# Patient Record
Sex: Female | Born: 1935 | Race: White | Hispanic: No | Marital: Married | Smoking: Never smoker
Health system: Southern US, Community
[De-identification: ages and names within clinical notes are randomized; demographics above are authoritative.]

## PROBLEM LIST (undated history)

## (undated) DIAGNOSIS — E785 Hyperlipidemia, unspecified: Secondary | ICD-10-CM

## (undated) DIAGNOSIS — F329 Major depressive disorder, single episode, unspecified: Secondary | ICD-10-CM

## (undated) DIAGNOSIS — C801 Malignant (primary) neoplasm, unspecified: Secondary | ICD-10-CM

## (undated) DIAGNOSIS — F32A Depression, unspecified: Secondary | ICD-10-CM

## (undated) DIAGNOSIS — M1990 Unspecified osteoarthritis, unspecified site: Secondary | ICD-10-CM

## (undated) DIAGNOSIS — M810 Age-related osteoporosis without current pathological fracture: Secondary | ICD-10-CM

## (undated) DIAGNOSIS — R011 Cardiac murmur, unspecified: Secondary | ICD-10-CM

## (undated) DIAGNOSIS — C189 Malignant neoplasm of colon, unspecified: Secondary | ICD-10-CM

## (undated) DIAGNOSIS — G629 Polyneuropathy, unspecified: Secondary | ICD-10-CM

## (undated) DIAGNOSIS — G259 Extrapyramidal and movement disorder, unspecified: Secondary | ICD-10-CM

## (undated) DIAGNOSIS — H539 Unspecified visual disturbance: Secondary | ICD-10-CM

## (undated) HISTORY — DX: Unspecified osteoarthritis, unspecified site: M19.90

## (undated) HISTORY — DX: Malignant (primary) neoplasm, unspecified: C80.1

## (undated) HISTORY — DX: Depression, unspecified: F32.A

## (undated) HISTORY — PX: COLON RESECTION: SHX5231

## (undated) HISTORY — DX: Extrapyramidal and movement disorder, unspecified: G25.9

## (undated) HISTORY — PX: CHOLECYSTECTOMY: SHX55

## (undated) HISTORY — DX: Cardiac murmur, unspecified: R01.1

## (undated) HISTORY — DX: Malignant neoplasm of colon, unspecified: C18.9

## (undated) HISTORY — DX: Age-related osteoporosis without current pathological fracture: M81.0

## (undated) HISTORY — DX: Unspecified visual disturbance: H53.9

## (undated) HISTORY — DX: Major depressive disorder, single episode, unspecified: F32.9

## (undated) HISTORY — DX: Hyperlipidemia, unspecified: E78.5

## (undated) HISTORY — DX: Polyneuropathy, unspecified: G62.9

---

## 2013-11-07 HISTORY — PX: CATARACT EXTRACTION, BILATERAL: SHX1313

## 2013-12-25 DIAGNOSIS — H35379 Puckering of macula, unspecified eye: Secondary | ICD-10-CM | POA: Diagnosis not present

## 2014-01-03 DIAGNOSIS — B353 Tinea pedis: Secondary | ICD-10-CM | POA: Diagnosis not present

## 2014-01-03 DIAGNOSIS — M2040 Other hammer toe(s) (acquired), unspecified foot: Secondary | ICD-10-CM | POA: Diagnosis not present

## 2014-01-03 DIAGNOSIS — L600 Ingrowing nail: Secondary | ICD-10-CM | POA: Diagnosis not present

## 2014-01-22 DIAGNOSIS — G609 Hereditary and idiopathic neuropathy, unspecified: Secondary | ICD-10-CM | POA: Diagnosis not present

## 2014-01-22 DIAGNOSIS — G20 Parkinson's disease: Secondary | ICD-10-CM | POA: Diagnosis not present

## 2014-02-17 DIAGNOSIS — H35379 Puckering of macula, unspecified eye: Secondary | ICD-10-CM | POA: Diagnosis not present

## 2014-02-24 DIAGNOSIS — R5383 Other fatigue: Secondary | ICD-10-CM | POA: Diagnosis not present

## 2014-02-24 DIAGNOSIS — R030 Elevated blood-pressure reading, without diagnosis of hypertension: Secondary | ICD-10-CM | POA: Diagnosis not present

## 2014-02-24 DIAGNOSIS — M899 Disorder of bone, unspecified: Secondary | ICD-10-CM | POA: Diagnosis not present

## 2014-02-24 DIAGNOSIS — M949 Disorder of cartilage, unspecified: Secondary | ICD-10-CM | POA: Diagnosis not present

## 2014-02-24 DIAGNOSIS — E785 Hyperlipidemia, unspecified: Secondary | ICD-10-CM | POA: Diagnosis not present

## 2014-02-24 DIAGNOSIS — R5381 Other malaise: Secondary | ICD-10-CM | POA: Diagnosis not present

## 2014-03-03 DIAGNOSIS — Z0000 Encounter for general adult medical examination without abnormal findings: Secondary | ICD-10-CM | POA: Diagnosis not present

## 2014-03-03 DIAGNOSIS — H3553 Other dystrophies primarily involving the sensory retina: Secondary | ICD-10-CM | POA: Diagnosis not present

## 2014-03-03 DIAGNOSIS — R269 Unspecified abnormalities of gait and mobility: Secondary | ICD-10-CM | POA: Diagnosis not present

## 2014-03-03 DIAGNOSIS — Z23 Encounter for immunization: Secondary | ICD-10-CM | POA: Diagnosis not present

## 2014-03-07 DIAGNOSIS — B353 Tinea pedis: Secondary | ICD-10-CM | POA: Diagnosis not present

## 2014-03-07 DIAGNOSIS — L600 Ingrowing nail: Secondary | ICD-10-CM | POA: Diagnosis not present

## 2014-03-07 DIAGNOSIS — M2040 Other hammer toe(s) (acquired), unspecified foot: Secondary | ICD-10-CM | POA: Diagnosis not present

## 2014-03-11 DIAGNOSIS — M949 Disorder of cartilage, unspecified: Secondary | ICD-10-CM | POA: Diagnosis not present

## 2014-03-11 DIAGNOSIS — M899 Disorder of bone, unspecified: Secondary | ICD-10-CM | POA: Diagnosis not present

## 2014-07-25 DIAGNOSIS — L600 Ingrowing nail: Secondary | ICD-10-CM | POA: Diagnosis not present

## 2014-07-25 DIAGNOSIS — B351 Tinea unguium: Secondary | ICD-10-CM | POA: Diagnosis not present

## 2014-07-29 DIAGNOSIS — IMO0002 Reserved for concepts with insufficient information to code with codable children: Secondary | ICD-10-CM | POA: Diagnosis not present

## 2014-07-29 DIAGNOSIS — G20 Parkinson's disease: Secondary | ICD-10-CM | POA: Diagnosis not present

## 2014-08-01 DIAGNOSIS — E559 Vitamin D deficiency, unspecified: Secondary | ICD-10-CM | POA: Diagnosis not present

## 2014-08-01 DIAGNOSIS — R5383 Other fatigue: Secondary | ICD-10-CM | POA: Diagnosis not present

## 2014-08-01 DIAGNOSIS — E785 Hyperlipidemia, unspecified: Secondary | ICD-10-CM | POA: Diagnosis not present

## 2014-08-01 DIAGNOSIS — R030 Elevated blood-pressure reading, without diagnosis of hypertension: Secondary | ICD-10-CM | POA: Diagnosis not present

## 2014-08-01 DIAGNOSIS — M899 Disorder of bone, unspecified: Secondary | ICD-10-CM | POA: Diagnosis not present

## 2014-08-01 DIAGNOSIS — R5381 Other malaise: Secondary | ICD-10-CM | POA: Diagnosis not present

## 2014-08-01 DIAGNOSIS — M949 Disorder of cartilage, unspecified: Secondary | ICD-10-CM | POA: Diagnosis not present

## 2014-08-06 DIAGNOSIS — M810 Age-related osteoporosis without current pathological fracture: Secondary | ICD-10-CM | POA: Diagnosis not present

## 2014-08-06 DIAGNOSIS — F329 Major depressive disorder, single episode, unspecified: Secondary | ICD-10-CM | POA: Diagnosis not present

## 2014-08-06 DIAGNOSIS — F3289 Other specified depressive episodes: Secondary | ICD-10-CM | POA: Diagnosis not present

## 2014-08-06 DIAGNOSIS — E559 Vitamin D deficiency, unspecified: Secondary | ICD-10-CM | POA: Diagnosis not present

## 2014-10-14 ENCOUNTER — Encounter: Payer: Self-pay | Admitting: Internal Medicine

## 2014-10-14 ENCOUNTER — Ambulatory Visit (INDEPENDENT_AMBULATORY_CARE_PROVIDER_SITE_OTHER): Payer: Medicare Other | Admitting: Internal Medicine

## 2014-10-14 VITALS — BP 145/70 | HR 86 | Temp 97.9°F | Ht 60.0 in | Wt 144.3 lb

## 2014-10-14 DIAGNOSIS — F329 Major depressive disorder, single episode, unspecified: Secondary | ICD-10-CM

## 2014-10-14 DIAGNOSIS — H35349 Macular cyst, hole, or pseudohole, unspecified eye: Secondary | ICD-10-CM | POA: Diagnosis not present

## 2014-10-14 DIAGNOSIS — F32A Depression, unspecified: Secondary | ICD-10-CM

## 2014-10-14 DIAGNOSIS — G20 Parkinson's disease: Secondary | ICD-10-CM | POA: Diagnosis not present

## 2014-10-14 DIAGNOSIS — R030 Elevated blood-pressure reading, without diagnosis of hypertension: Secondary | ICD-10-CM

## 2014-10-14 DIAGNOSIS — M47816 Spondylosis without myelopathy or radiculopathy, lumbar region: Secondary | ICD-10-CM | POA: Insufficient documentation

## 2014-10-14 DIAGNOSIS — M1712 Unilateral primary osteoarthritis, left knee: Secondary | ICD-10-CM | POA: Diagnosis not present

## 2014-10-14 DIAGNOSIS — M159 Polyosteoarthritis, unspecified: Secondary | ICD-10-CM | POA: Insufficient documentation

## 2014-10-14 DIAGNOSIS — IMO0001 Reserved for inherently not codable concepts without codable children: Secondary | ICD-10-CM

## 2014-10-14 DIAGNOSIS — M810 Age-related osteoporosis without current pathological fracture: Secondary | ICD-10-CM | POA: Diagnosis not present

## 2014-10-14 DIAGNOSIS — R251 Tremor, unspecified: Secondary | ICD-10-CM | POA: Insufficient documentation

## 2014-10-14 MED ORDER — SERTRALINE HCL 50 MG PO TABS: 25.0000 mg | ORAL_TABLET | Freq: Every day | ORAL | Status: DC

## 2014-10-14 MED ORDER — ALENDRONATE SODIUM 10 MG PO TABS: 10.0000 mg | ORAL_TABLET | Freq: Every day | ORAL | Status: DC

## 2014-10-14 NOTE — Assessment & Plan Note (Addendum)
Pt states that she was diagnosed with depression 08/06/14 at her last PCP appt in [place] with Dr. Edison Pace. She was started on 25mg  Zoloft daily at that time, which was helpful until they moved here to [facility] and her husband fell and fractured his ribs. She states that she has been feeling more stresses and her mood is more depressed, and she is requesting to increase the dose. She denies nay thoughts of harm. Given her social stressors and now that she is having to decide about placement for her husband, will increase the Zoloft dose. - Increasing Zoloft to 50mg  daily - CSW also provided information to the patient regarding placement and home health options for her husband, which seemed to brighten her mood.

## 2014-10-14 NOTE — Patient Instructions (Signed)
We will increase the Zoloft to 50mg  daily, this will take a number of weeks to reach it's full strength Begin taking Alendronate 10mg  daily. This is for your osteoporosis. Be sure to take with water at least 30 minutes before you eat and do not lie down until after you have eating. Please have all your doctors in [place] send [place] your medical records.  Alendronate tablets What is this medicine? ALENDRONATE (a LEN droe nate) slows calcium loss from bones. It helps to make normal healthy bone and to slow bone loss in people with Paget's disease and osteoporosis. It may be used in others at risk for bone loss. This medicine may be used for other purposes; ask your health care provider or pharmacist if you have questions. COMMON BRAND NAME(S): Fosamax What should I tell my health care provider before I take this medicine? They need to know if you have any of these conditions: -dental disease -esophagus, stomach, or intestine problems, like acid reflux or GERD -kidney disease -low blood calcium -low vitamin D -problems sitting or standing 30 minutes -trouble swallowing -an unusual or allergic reaction to alendronate, other medicines, foods, dyes, or preservatives -pregnant or trying to get pregnant -breast-feeding How should I use this medicine? You must take this medicine exactly as directed or you will lower the amount of the medicine you absorb into your body or you may cause yourself harm. Take this medicine by mouth first thing in the morning, after you are up for the day. Do not eat or drink anything before you take your medicine. Swallow the tablet with a full glass (6 to 8 fluid ounces) of plain water. Do not take this medicine with any other drink. Do not chew or crush the tablet. After taking this medicine, do not eat breakfast, drink, or take any medicines or vitamins for at least 30 minutes. Sit or stand up for at least 30 minutes after you take this medicine; do not lie down. Do not take  your medicine more often than directed. Talk to your pediatrician regarding the use of this medicine in children. Special care may be needed. Overdosage: If you think you have taken too much of this medicine contact a poison control center or emergency room at once. NOTE: This medicine is only for you. Do not share this medicine with others. What if I miss a dose? If you miss a dose, do not take it later in the day. Continue your normal schedule starting the next morning. Do not take double or extra doses. What may interact with this medicine? -aluminum hydroxide -antacids -aspirin -calcium supplements -drugs for inflammation like ibuprofen, naproxen, and others -iron supplements -magnesium supplements -vitamins with minerals This list may not describe all possible interactions. Give your health care provider a list of all the medicines, herbs, non-prescription drugs, or dietary supplements you use. Also tell them if you smoke, drink alcohol, or use illegal drugs. Some items may interact with your medicine. What should I watch for while using this medicine? Visit your doctor or health care professional for regular checks ups. It may be some time before you see benefit from this medicine. Do not stop taking your medicine except on your doctor's advice. Your doctor or health care professional may order blood tests and other tests to see how you are doing. You should make sure you get enough calcium and vitamin D while you are taking this medicine, unless your doctor tells you not to. Discuss the foods you eat and  the vitamins you take with your health care professional. Some people who take this medicine have severe bone, joint, and/or muscle pain. This medicine may also increase your risk for a broken thigh bone. Tell your doctor right away if you have pain in your upper leg or groin. Tell your doctor if you have any pain that does not go away or that gets worse. This medicine can make you more  sensitive to the sun. If you get a rash while taking this medicine, sunlight may cause the rash to get worse. Keep out of the sun. If you cannot avoid being in the sun, wear protective clothing and use sunscreen. Do not use sun lamps or tanning beds/booths. What side effects may I notice from receiving this medicine? Side effects that you should report to your doctor or health care professional as soon as possible: -allergic reactions like skin rash, itching or hives, swelling of the face, lips, or tongue -black or tarry stools -bone, muscle or joint pain -changes in vision -chest pain -heartburn or stomach pain -jaw pain, especially after dental work -pain or trouble when swallowing -redness, blistering, peeling or loosening of the skin, including inside the mouth Side effects that usually do not require medical attention (report to your doctor or health care professional if they continue or are bothersome): -changes in taste -diarrhea or constipation -eye pain or itching -headache -nausea or vomiting -stomach gas or fullness This list may not describe all possible side effects. Call your doctor for medical advice about side effects. You may report side effects to FDA at 1-800-FDA-1088. Where should I keep my medicine? Keep out of the reach of children. Store at room temperature of 15 and 30 degrees C (59 and 86 degrees F). Throw away any unused medicine after the expiration date. NOTE: This sheet is a summary. It may not cover all possible information. If you have questions about this medicine, talk to your doctor, pharmacist, or health care provider.  2015, Elsevier/Gold Standard. (2011-04-22 08:56:09)   General Instructions:   Please bring your medicines with you each time you come to clinic.  Medicines may include prescription medications, over-the-counter medications, herbal remedies, eye drops, vitamins, or other pills.

## 2014-10-14 NOTE — Progress Notes (Addendum)
Patient ID: PRISCELLA DONNA, female   DOB: November 22, 1930, 80 y.o.   MRN: [id number]  Subjective:   Patient ID: BIANA HAGGAR female   DOB: [birth year] 80 y.o.   MRN: [id number]  HPI: Ms.Ameli A Sistare is a 80 y.o. F w/ PMH depression, lumbar spondylosis with radiculopathy, and osteoporosis who presents as a new patient.   She states that she and her husband just moved to [place] 3 weeks ago. Her husband, who suffers from dementia, fell approx 1 week ago and has had increased dementia since his hsopitalization. She is trying to decide what would be the best for her husband placement wise- home vs SNF. She feels that he will do better at home and seems to be leaning that way.    She is taking Zoloft from depression that was diagnosed 08/06/14 by her PCP in [facility]. She was started on Zoloft 25mg  daily. She did miss 3 doses last TWTh b/c she ran out. Dr. Lynnae January filled the rx for her. The pt states that with her husband in the hospital, she is having increased stress and feeling down. She requests to increase the dose.   Pt states that she has had a macular hole since '94 which is unchanged, and had bilateral cataract removal over the last year. She is interesting in establishing care with a new Opthalmologist her in [place].  She does endorse an tremor in her R hand and leg that is worse with increased stress, for which she was seeing Neurologist in [place]. She states that she was diagnosed with parkinsonism, not Parkinson's Disease, and was started on carbidopa-levodopa, which made her vomit, so she stopped taking the medication.   She has osteoarthritis in her L knee with hypertrophy of the knee. She denies significant pain in the knee, and states that she has trouble with it after sitting for a while, b/c it stiffens up. She takes Tylenol 1000mg  once in the morning and is good for the day.   She is able to bathe and feed herself and states that she can walk around well with th assistance of her walker.     Past  Medical History  Diagnosis Date  . Arthritis   . Depression   . Heart murmur   . Hyperlipidemia   . Osteoporosis    Current Outpatient Prescriptions  Medication Sig Dispense Refill  . acetaminophen (TYLENOL) 500 MG tablet Take 500 mg by mouth every 8 (eight) hours as needed.    Marland Kitchen alendronate (FOSAMAX) 10 MG tablet Take 1 tablet (10 mg total) by mouth daily before breakfast. Take with a full glass of water on an empty stomach. 30 tablet 6  . Calcium Carbonate-Vitamin D (CALTRATE 600+D) 600-400 MG-UNIT per tablet Take 1 tablet by mouth daily.    . cholecalciferol (VITAMIN D) 1000 UNITS tablet Take 1,000 Units by mouth daily.    Marland Kitchen gabapentin (NEURONTIN) 100 MG capsule Take 100 mg by mouth at bedtime.    . meloxicam (MOBIC) 15 MG tablet Take 15 mg by mouth daily.    . Multiple Vitamin (MULTIVITAMIN) tablet Take 1 tablet by mouth daily.    . sertraline (ZOLOFT) 50 MG tablet Take 0.5 tablets (25 mg total) by mouth daily. 30 tablet 3  . vitamin B-12 (CYANOCOBALAMIN) 1000 MCG tablet Take 1,000 mcg by mouth daily.     No current facility-administered medications for this visit.   No family history on file. History   Social History  . Marital Status: Married  Spouse Name: N/A    Number of Children: N/A  . Years of Education: N/A   Social History Main Topics  . Smoking status: Never Smoker   . Smokeless tobacco: Not on file  . Alcohol Use: Not on file  . Drug Use: Not on file  . Sexual Activity: No   Other Topics Concern  . Not on file   Social History Narrative  . No narrative on file   Review of Systems: Constitutional: Denies fever, chills, appetite change, or fatigue.  HEENT: Denies hearing loss, congestion, sore throat, trouble swallowing, neck pain.   Respiratory: Denies SOB, DOE Cardiovascular: Denies chest pain or palpitations. Endorses leg swelling.  Gastrointestinal: Denies nausea, vomiting, abdominal pain, diarrhea, constipation Genitourinary: Denies dysuria,  urgency Musculoskeletal: Endorses L knee pain and lumbar pain with radiation to the back of her legs at times that affect her ability to ambulate- she uses a rolling walker and denies difficulty with its use.  Skin: Denies rash or wound.  Neurological: Denies dizziness, syncope, weakness, light-headedness, numbness, or headaches.  Psychiatric/Behavioral: Denies suicidal ideation, confusion. Endorses depressed mood.  Objective:  Physical Exam: Filed Vitals:   10/14/14 1040 10/14/14 1146  BP: 173/73 145/70  Pulse: 85 86  Temp: 97.9 F (36.6 C)   TempSrc: Oral   Height: 5' (1.524 m)   Weight: 144 lb 4.8 oz (65.454 kg)   SpO2: 100%    Constitutional: Vital signs reviewed.  Patient is a very pleasant elderly female in no acute distress and cooperative with exam. Alert and oriented x3.  Head: Normocephalic and atraumatic Ear: TM normal bilaterally, cerumen present in bilateral canals Nose: No erythema or drainage noted.  Turbinates normal Mouth: No erythema or exudates, MMM Eyes: PERRL, EOMI. No scleral icterus.  Neck: Supple. Trachea midline, normal ROM, No JVD, mass, thyromegaly.  Cardiovascular: RRR, no MRG, pulses symmetric and intact bilaterally Pulmonary/Chest: Normal respiratory effort, CTAB, no wheezes, rales, or rhonchi Abdominal: Soft. Non-tender, non-distended, bowel sounds are normal, no guarding present.  Musculoskeletal: Hyperkyphosis present. Hypertrophy of the left knee present. No warmth or tenderness or effusion present. Hematology: No cervical adenopathy.  Neurological: A&O x3, Strength is 5/5 and symmetric bilaterally, cranial nerve II-XII are grossly intact, moves all 4 extremities, gait not observed. Tremor of the right hand (not pill rolling) and leg present at rest and with motion. No significant cogwheel rigidity; no masked facies.    Skin: Warm, dry and intact.  Psychiatric: Normal mood and affect. Speech and behavior is normal.   Assessment & Plan:   Please  refer to Problem List based Assessment and Plan

## 2014-10-14 NOTE — Assessment & Plan Note (Addendum)
BP 173/73 on arrival to the clinic and improved to 145/70 on recheck. Will hold off on starting medications at this time given her increased stressors in dealing with her recent move to [place], husband's fall and hospitalization, and now deciding on placement for him. Labs from PCP in [place] done 08/06/14 with normal electrolytes and renal function. - Will reassess need for antihypertensives when she returns to the clinic

## 2014-10-14 NOTE — Assessment & Plan Note (Addendum)
DEXA 03/11/14: Left femoral neck bone density 0.538 g/cm2. T score -2.8. Per records from her PCP in [place], this is a decrease in bone mineral density from prior studies. Pt on Calcium and Vit D but not on bisphosphonate. Per pt, h/o GI upset with weekly bisphosphonate.  - Starting Fosamax 10mg  daily with water to be taken at least 30 minutes before eating, and she was instructed not to lie down for at least 30 minutes until after she has eaten  Addendum: 10/28/14 Pt called the clinic today and states that she has been taking the Fosamax since 12/8, but began having stomach cramps and joint soreness 5 days ago. She stopped taking the Fosamax yesterday and is feeling better today. While stomach pains and GI upset can occur with the bisphosphonate, joint pain can also be a side effect.  - Pt to stop Fosamax - Other options are IV bisphosphates, but with her joint pain on the oral bisphosphonate, IV will likely result in the same pain, or SERM (selective estrogen receptor modulator), or bone modifying monoclonal antibody like Prolia. - Will ask nursing to schedule the patient to be seen in clinic early next month.

## 2014-10-14 NOTE — Assessment & Plan Note (Signed)
Pt taking Tylenol 1000mg  daily for her pain. Labs from her PCP in [place] from 08/06/14 with normal LFTs. Pt denies much pain in her knee, and states that it is mostly stiff and painful after sitting for a while. On exam, she has good ROM of both knee with the left knee being a little more diminished. Mild crepitus. No warmth. Hypertrophy present. She declines an knee injection or referral to PT at this time.

## 2014-10-14 NOTE — Assessment & Plan Note (Signed)
Pt with tremor, diagnosed with parkinsonism by Neurologist in [place] and was treated with cabidopa-levodopa, which she did not tolerate. She is currently not on any medications for this. Tremor present in R hand and foot at rest and with motion. Not pill rolling. No significant cogwheel rigidity. No masked facies. Did not visualize gait, as she was in a wheelchair. Pt moved to [place] 3 weeks ago and would like to est care with a Neurologist here.  - Referring to Neurology to establish care here in [place].

## 2014-10-14 NOTE — Assessment & Plan Note (Signed)
Pt states that she has a macular hole that was diagnosed in '94 and has been stable. She does not have an Opthalmologist here in [place] and request a referral to establish care with one here. She denies any acute vision changes.  - Referral place for Opthalmology

## 2014-10-15 NOTE — Progress Notes (Signed)
INTERNAL MEDICINE TEACHING ATTENDING ADDENDUM - Javier Mamone, MD: I reviewed and discussed at the time of visit with the resident Dr. Glenn, the patient's medical history, physical examination, diagnosis and results of pertinent tests and treatment and I agree with the patient's care as documented.  

## 2014-10-28 ENCOUNTER — Telehealth: Payer: Self-pay | Admitting: *Deleted

## 2014-10-28 NOTE — Telephone Encounter (Signed)
Pt informed and scheduled for appointment 1/8

## 2014-10-28 NOTE — Telephone Encounter (Signed)
Pt called stating she thinks she is having a reaction to Fosamax. This med was started on 12/8 but she c/o having stomach cramps and joint soreness for plast 5 days.  She states she had a reaction to the monthly for Osteoporosis.    Stopped med today and cramps some better.  She also took tylenol and has been using a heating pad to abd. with some relief.  Please advise

## 2014-10-28 NOTE — Telephone Encounter (Signed)
Please tell her to hold on the Fosamax for now. She will need to be seen early in January here in the clinic. Please schedule her with someone in [facility] that month while her PCP assignment is pending.

## 2014-11-14 ENCOUNTER — Ambulatory Visit: Payer: Medicare Other | Admitting: Internal Medicine

## 2014-11-19 ENCOUNTER — Ambulatory Visit (INDEPENDENT_AMBULATORY_CARE_PROVIDER_SITE_OTHER): Payer: Medicare Other | Admitting: Neurology

## 2014-11-19 ENCOUNTER — Encounter: Payer: Self-pay | Admitting: Neurology

## 2014-11-19 VITALS — BP 144/90 | HR 70 | Resp 16 | Ht 60.0 in | Wt 140.4 lb

## 2014-11-19 DIAGNOSIS — M436 Torticollis: Secondary | ICD-10-CM | POA: Diagnosis not present

## 2014-11-19 DIAGNOSIS — R251 Tremor, unspecified: Secondary | ICD-10-CM | POA: Diagnosis not present

## 2014-11-19 DIAGNOSIS — R269 Unspecified abnormalities of gait and mobility: Secondary | ICD-10-CM | POA: Diagnosis not present

## 2014-11-19 DIAGNOSIS — R258 Other abnormal involuntary movements: Secondary | ICD-10-CM | POA: Diagnosis not present

## 2014-11-19 MED ORDER — MELOXICAM 15 MG PO TABS: 15.0000 mg | ORAL_TABLET | Freq: Every day | ORAL | Status: DC

## 2014-11-19 MED ORDER — DIAZEPAM 2 MG PO TABS: 2.0000 mg | ORAL_TABLET | Freq: Three times a day (TID) | ORAL | Status: DC

## 2014-11-19 MED ORDER — GABAPENTIN 100 MG PO CAPS: 100.0000 mg | ORAL_CAPSULE | Freq: Every day | ORAL | Status: DC

## 2014-11-19 NOTE — Progress Notes (Signed)
[facility]  PATIENT: Helen Odom DOB: Jan 18, 1931  REFERRING CLINICIAN: Clayburn Pert HISTORY FROM: Patient and caregiver REASON FOR VISIT: Tremors and worsening gait   HISTORICAL  CHIEF COMPLAINT:  Chief Complaint  Patient presents with  . Tremors    HISTORY OF PRESENT ILLNESS:  I had the pleasure seeing you patient, Helen Odom, at [facility] for a neurologic consultation regarding her parkinsonism. She recently moved to the [place] area with her husband. About a year and a half ago, she noted tremor in the right leg and the left greater than right arm. She went to a neurologist who diagnosed her with parkinsonism.  He prescribed carbidopa levodopa. However, she took only a couple doses as it made her very nauseous. No other medications were tried. She noted mild weakness in her legs at that time. She feels her arms are not as weak as the legs but they tire out easily. The tremor in her leg is variable, often depending on how she is sitting (worse when ankle is more extended).  It is less likely to occur while standing.   It is always worse on her right and sometimes just present on the right.  The tremor in the arms (left worse than right) are different than the tremors in the legs.   The hand tremors are worse when she tries to hold still (like writing and eating).    They are less likely to occur at rest.     The weakness in her legs has increased and she feels weaker over the past few months. She is having a lot of difficulty getting out of chairs.   The tremors have also worsened. Her gait has progressively worsened over the last couple of years. Specifically, her stride has become shorter and her balance is more unsteady. She is very careful so she does not fall over the past couple of months she has had more difficulty getting out of a chair and how her caregivers help her stand. When she stands she is able to walk but with a small  stride. For longer distances she uses a wheelchair.  She denies any bladder or bowel changes. She has not had any incontinence.  Her mother had a tremor at times when she was older  REVIEW OF SYSTEMS:  Constitutional: No fevers, chills, sweats, or change in appetite Eyes: No visual changes, double vision, eye pain Ear, nose and throat: No hearing loss, ear pain, nasal congestion, sore throat Cardiovascular: No chest pain, palpitations Respiratory:  No shortness of breath at rest or with exertion.   No wheezes GastrointestinaI: No nausea, vomiting, diarrhea, abdominal pain, fecal incontinence Genitourinary:  No dysuria, urinary retention or frequency.  No nocturia. Musculoskeletal:  No neck pain, back pain.  She sometimes has right leg pain when sitting a while.   Integumentary: No rash, pruritus, skin lesions Neurological: as above Psychiatric: No depression at this time.  No anxiety Endocrine: No palpitations, diaphoresis, change in appetite, change in weigh or increased thirst Hematologic/Lymphatic:  No anemia, purpura, petechiae. Allergic/Immunologic: No itchy/runny eyes, nasal congestion, recent allergic reactions, rashes  ALLERGIES: Allergies  Allergen Reactions  . Flu Virus Vaccine   . Penicillins     HOME MEDICATIONS: Outpatient Prescriptions Prior to Visit  Medication Sig Dispense Refill  . acetaminophen (TYLENOL) 500 MG tablet Take 500 mg by mouth every 8 (eight) hours as needed.    . Calcium Carbonate-Vitamin D (CALTRATE 600+D) 600-400 MG-UNIT per tablet Take 1 tablet  by mouth daily.    . cholecalciferol (VITAMIN D) 1000 UNITS tablet Take 1,000 Units by mouth daily.    Marland Kitchen gabapentin (NEURONTIN) 100 MG capsule Take 100 mg by mouth at bedtime.    . meloxicam (MOBIC) 15 MG tablet Take 15 mg by mouth daily.    . Multiple Vitamin (MULTIVITAMIN) tablet Take 1 tablet by mouth daily.    . sertraline (ZOLOFT) 50 MG tablet Take 0.5 tablets (25 mg total) by mouth daily. 30 tablet  3  . vitamin B-12 (CYANOCOBALAMIN) 1000 MCG tablet Take 1,000 mcg by mouth daily.    Marland Kitchen alendronate (FOSAMAX) 10 MG tablet Take 1 tablet (10 mg total) by mouth daily before breakfast. Take with a full glass of water on an empty stomach. (Patient not taking: Reported on 11/19/2014) 30 tablet 6   No facility-administered medications prior to visit.    PAST MEDICAL HISTORY: Past Medical History  Diagnosis Date  . Arthritis   . Depression   . Heart murmur   . Hyperlipidemia   . Osteoporosis   . Cancer   . Colon cancer   . Movement disorder   . Neuropathy   . Vision abnormalities     PAST SURGICAL HISTORY: Past Surgical History  Procedure Laterality Date  . Cataract extraction, bilateral Bilateral 2015  . Cholecystectomy    . Colon resection      FAMILY HISTORY: Family History  Problem Relation Age of Onset  . Congestive Heart Failure Mother     Deceased at 70 yo  . CAD Father   . Heart attack Father 64    Deceased  . Atrial fibrillation Brother     SOCIAL HISTORY:  History   Social History  . Marital Status: Married    Spouse Name: N/A    Number of Children: N/A  . Years of Education: N/A   Occupational History  . Not on file.   Social History Main Topics  . Smoking status: Never Smoker   . Smokeless tobacco: Never Used  . Alcohol Use: No     Comment: occasional  . Drug Use: No  . Sexual Activity: No   Other Topics Concern  . Not on file   Social History Narrative     PHYSICAL EXAM  Filed Vitals:   11/19/14 1409  BP: 144/90  Pulse: 70  Resp: 16  Height: 5' (1.524 m)  Weight: 140 lb 6.4 oz (63.685 kg)    Body mass index is 27.42 kg/(m^2).   General: The patient is well-developed and well-nourished and in no acute distress  Eyes:  Funduscopic exam shows normal optic discs and retinal vessels.  Neck: The neck is supple, no carotid bruits are noted.  The neck is nontender. Range of motion of the neck is limited to about 20 rotation to  either side. Flexion and extension is just slightly limited.  Respiratory: The respiratory examination is clear.  Cardiovascular: The cardiovascular examination reveals a regular rate and rhythm, no murmurs, gallops or rubs are noted.  Skin: Extremities are without significant edema.  Neurologic Exam  Mental status: The patient is alert and oriented x 3 at the time of the examination. The patient has apparent normal recent and remote memory, with an apparently normal attention span and concentration ability.   Speech is normal.  Cranial nerves: Extraocular movements are full. Pupils are equal, round, and reactive to light and accomodation.  Visual fields are full.  Facial symmetry is present. There is good facial sensation to soft  touch bilaterally.Facial strength is normal.  Trapezius and sternocleidomastoid strength is normal. No dysarthria is noted.  The tongue is midline, and the patient has symmetric elevation of the soft palate. No obvious hearing deficits are noted.  Motor:  She has a 7 Hz essential tremor in both hands that is intensified by sustained posture. Muscle bulk is normal. Tone is slightly increased in the right leg. The left leg appears to have more normal tone. Strength is minimally decreased in the right leg compared to the left.   Sensory: Sensory testing is intact to pinprick, soft touch, vibration sensation, and position sense on all 4 extremities.  Coordination: Cerebellar testing reveals good finger-nose-finger but reduced heel-to-shin bilaterally.  Gait and station: Station and gait are normal. Tandem gait is normal. Romberg is negative.   Reflexes: Deep tendon reflexes increased in her legs and she has clonus at the right ankle. There is spread from the right knee. Reflexes in the arms are brisk without spread. Plantar responses are normal.    DIAGNOSTIC DATA (LABS, IMAGING, TESTING) - I reviewed patient records, labs, notes, testing and imaging myself where  available.    ASSESSMENT AND PLAN  In summary, Helen Odom is an 81 year old woman with progressive gait worsening, clonus in the right leg and a mild essential tremor in her hands, left slightly more than right. I do not think that she has Parkinson's disease or parkinsonism. I am more concerned that the clonus in the's due to either a spinal cord process such as cervical spondylosis with myelopathy or to a stroke or other lesion in the brain tremor in the hands appears to be benign essential tremor. It is disturbing her ability to eat. I will have her try Valium 2 mg by mouth 3 times a day as it should help the tremor and may also help the clonus. She is extremely claustrophobic and refuses to do an MRI. She does not want to do a CT scan but is willing to give it a try. Therefore, I have ordered a CT scan of the head and the cervical spine to try to rule out these processes.  She will return to see me in 2 months. She is advised to call earlier if she has worsening or new neurologic symptoms.  Thank you very much for asking me to see Helen Odom for a neurologic consultation. Please note I can be of further assistance with her or other patients in the future.    Helen Odom A. Felecia Shelling, MD, PhD 3/57/0177, 9:39 PM Certified in Neurology, Clinical Neurophysiology, Sleep Medicine, Pain Medicine and Neuroimaging  [facility] [address], [place], [place] [address] [phone]

## 2014-11-27 ENCOUNTER — Ambulatory Visit: Payer: Medicare Other | Admitting: Internal Medicine

## 2014-12-16 DIAGNOSIS — R251 Tremor, unspecified: Secondary | ICD-10-CM | POA: Diagnosis not present

## 2014-12-16 DIAGNOSIS — Z79899 Other long term (current) drug therapy: Secondary | ICD-10-CM | POA: Diagnosis not present

## 2014-12-16 DIAGNOSIS — R269 Unspecified abnormalities of gait and mobility: Secondary | ICD-10-CM | POA: Diagnosis not present

## 2014-12-16 DIAGNOSIS — F325 Major depressive disorder, single episode, in full remission: Secondary | ICD-10-CM | POA: Diagnosis not present

## 2014-12-16 DIAGNOSIS — E780 Pure hypercholesterolemia: Secondary | ICD-10-CM | POA: Diagnosis not present

## 2014-12-16 DIAGNOSIS — M25569 Pain in unspecified knee: Secondary | ICD-10-CM | POA: Diagnosis not present

## 2014-12-16 DIAGNOSIS — M549 Dorsalgia, unspecified: Secondary | ICD-10-CM | POA: Diagnosis not present

## 2014-12-17 DIAGNOSIS — H35341 Macular cyst, hole, or pseudohole, right eye: Secondary | ICD-10-CM | POA: Diagnosis not present

## 2014-12-17 DIAGNOSIS — Z961 Presence of intraocular lens: Secondary | ICD-10-CM | POA: Diagnosis not present

## 2014-12-17 DIAGNOSIS — H35372 Puckering of macula, left eye: Secondary | ICD-10-CM | POA: Diagnosis not present

## 2015-01-20 DIAGNOSIS — H35372 Puckering of macula, left eye: Secondary | ICD-10-CM | POA: Diagnosis not present

## 2015-01-20 DIAGNOSIS — H35413 Lattice degeneration of retina, bilateral: Secondary | ICD-10-CM | POA: Diagnosis not present

## 2015-01-20 DIAGNOSIS — H43811 Vitreous degeneration, right eye: Secondary | ICD-10-CM | POA: Diagnosis not present

## 2015-01-20 DIAGNOSIS — H35343 Macular cyst, hole, or pseudohole, bilateral: Secondary | ICD-10-CM | POA: Diagnosis not present

## 2015-01-27 ENCOUNTER — Ambulatory Visit: Payer: Medicare Other | Admitting: Neurology

## 2015-04-21 DIAGNOSIS — H35372 Puckering of macula, left eye: Secondary | ICD-10-CM | POA: Diagnosis not present

## 2015-04-21 DIAGNOSIS — H35343 Macular cyst, hole, or pseudohole, bilateral: Secondary | ICD-10-CM | POA: Diagnosis not present

## 2015-05-12 ENCOUNTER — Ambulatory Visit
Admission: RE | Admit: 2015-05-12 | Discharge: 2015-05-12 | Disposition: A | Discharge status: Home / Self Care | Payer: Medicare Other | Source: Ambulatory Visit | Attending: Geriatric Medicine | Admitting: Geriatric Medicine

## 2015-05-12 ENCOUNTER — Other Ambulatory Visit: Payer: Self-pay | Admitting: Geriatric Medicine

## 2015-05-12 DIAGNOSIS — R609 Edema, unspecified: Secondary | ICD-10-CM | POA: Diagnosis not present

## 2015-05-12 DIAGNOSIS — F329 Major depressive disorder, single episode, unspecified: Secondary | ICD-10-CM | POA: Diagnosis not present

## 2015-05-12 DIAGNOSIS — R269 Unspecified abnormalities of gait and mobility: Secondary | ICD-10-CM | POA: Diagnosis not present

## 2015-05-12 DIAGNOSIS — R918 Other nonspecific abnormal finding of lung field: Secondary | ICD-10-CM | POA: Diagnosis not present

## 2015-05-12 DIAGNOSIS — R05 Cough: Secondary | ICD-10-CM | POA: Diagnosis not present

## 2015-05-12 DIAGNOSIS — R011 Cardiac murmur, unspecified: Secondary | ICD-10-CM | POA: Diagnosis not present

## 2015-05-15 ENCOUNTER — Other Ambulatory Visit: Payer: Self-pay

## 2015-05-15 ENCOUNTER — Ambulatory Visit (HOSPITAL_COMMUNITY): Payer: Medicare Other | Attending: Cardiovascular Disease

## 2015-05-15 ENCOUNTER — Other Ambulatory Visit (HOSPITAL_COMMUNITY): Payer: Self-pay | Admitting: Geriatric Medicine

## 2015-05-15 DIAGNOSIS — R600 Localized edema: Secondary | ICD-10-CM | POA: Diagnosis not present

## 2015-05-15 DIAGNOSIS — R011 Cardiac murmur, unspecified: Secondary | ICD-10-CM | POA: Diagnosis not present

## 2015-07-14 DIAGNOSIS — F325 Major depressive disorder, single episode, in full remission: Secondary | ICD-10-CM | POA: Diagnosis not present

## 2015-07-14 DIAGNOSIS — R269 Unspecified abnormalities of gait and mobility: Secondary | ICD-10-CM | POA: Diagnosis not present

## 2015-07-14 DIAGNOSIS — I872 Venous insufficiency (chronic) (peripheral): Secondary | ICD-10-CM | POA: Diagnosis not present

## 2015-07-17 DIAGNOSIS — M810 Age-related osteoporosis without current pathological fracture: Secondary | ICD-10-CM | POA: Diagnosis not present

## 2015-07-17 DIAGNOSIS — R269 Unspecified abnormalities of gait and mobility: Secondary | ICD-10-CM | POA: Diagnosis not present

## 2015-07-17 DIAGNOSIS — I872 Venous insufficiency (chronic) (peripheral): Secondary | ICD-10-CM | POA: Diagnosis not present

## 2015-07-17 DIAGNOSIS — R279 Unspecified lack of coordination: Secondary | ICD-10-CM | POA: Diagnosis not present

## 2015-07-17 DIAGNOSIS — F329 Major depressive disorder, single episode, unspecified: Secondary | ICD-10-CM | POA: Diagnosis not present

## 2015-07-17 DIAGNOSIS — Z9181 History of falling: Secondary | ICD-10-CM | POA: Diagnosis not present

## 2015-07-17 DIAGNOSIS — G609 Hereditary and idiopathic neuropathy, unspecified: Secondary | ICD-10-CM | POA: Diagnosis not present

## 2015-07-20 DIAGNOSIS — F329 Major depressive disorder, single episode, unspecified: Secondary | ICD-10-CM | POA: Diagnosis not present

## 2015-07-20 DIAGNOSIS — G609 Hereditary and idiopathic neuropathy, unspecified: Secondary | ICD-10-CM | POA: Diagnosis not present

## 2015-07-20 DIAGNOSIS — R279 Unspecified lack of coordination: Secondary | ICD-10-CM | POA: Diagnosis not present

## 2015-07-20 DIAGNOSIS — I872 Venous insufficiency (chronic) (peripheral): Secondary | ICD-10-CM | POA: Diagnosis not present

## 2015-07-20 DIAGNOSIS — R269 Unspecified abnormalities of gait and mobility: Secondary | ICD-10-CM | POA: Diagnosis not present

## 2015-07-20 DIAGNOSIS — M810 Age-related osteoporosis without current pathological fracture: Secondary | ICD-10-CM | POA: Diagnosis not present

## 2015-07-22 DIAGNOSIS — M810 Age-related osteoporosis without current pathological fracture: Secondary | ICD-10-CM | POA: Diagnosis not present

## 2015-07-22 DIAGNOSIS — I872 Venous insufficiency (chronic) (peripheral): Secondary | ICD-10-CM | POA: Diagnosis not present

## 2015-07-22 DIAGNOSIS — R279 Unspecified lack of coordination: Secondary | ICD-10-CM | POA: Diagnosis not present

## 2015-07-22 DIAGNOSIS — G609 Hereditary and idiopathic neuropathy, unspecified: Secondary | ICD-10-CM | POA: Diagnosis not present

## 2015-07-22 DIAGNOSIS — R269 Unspecified abnormalities of gait and mobility: Secondary | ICD-10-CM | POA: Diagnosis not present

## 2015-07-22 DIAGNOSIS — F329 Major depressive disorder, single episode, unspecified: Secondary | ICD-10-CM | POA: Diagnosis not present

## 2015-07-24 DIAGNOSIS — M810 Age-related osteoporosis without current pathological fracture: Secondary | ICD-10-CM | POA: Diagnosis not present

## 2015-07-24 DIAGNOSIS — R279 Unspecified lack of coordination: Secondary | ICD-10-CM | POA: Diagnosis not present

## 2015-07-24 DIAGNOSIS — G609 Hereditary and idiopathic neuropathy, unspecified: Secondary | ICD-10-CM | POA: Diagnosis not present

## 2015-07-24 DIAGNOSIS — R269 Unspecified abnormalities of gait and mobility: Secondary | ICD-10-CM | POA: Diagnosis not present

## 2015-07-24 DIAGNOSIS — I872 Venous insufficiency (chronic) (peripheral): Secondary | ICD-10-CM | POA: Diagnosis not present

## 2015-07-24 DIAGNOSIS — F329 Major depressive disorder, single episode, unspecified: Secondary | ICD-10-CM | POA: Diagnosis not present

## 2015-07-27 DIAGNOSIS — F329 Major depressive disorder, single episode, unspecified: Secondary | ICD-10-CM | POA: Diagnosis not present

## 2015-07-27 DIAGNOSIS — G609 Hereditary and idiopathic neuropathy, unspecified: Secondary | ICD-10-CM | POA: Diagnosis not present

## 2015-07-27 DIAGNOSIS — R279 Unspecified lack of coordination: Secondary | ICD-10-CM | POA: Diagnosis not present

## 2015-07-27 DIAGNOSIS — M810 Age-related osteoporosis without current pathological fracture: Secondary | ICD-10-CM | POA: Diagnosis not present

## 2015-07-27 DIAGNOSIS — I872 Venous insufficiency (chronic) (peripheral): Secondary | ICD-10-CM | POA: Diagnosis not present

## 2015-07-27 DIAGNOSIS — R269 Unspecified abnormalities of gait and mobility: Secondary | ICD-10-CM | POA: Diagnosis not present

## 2015-07-29 DIAGNOSIS — G609 Hereditary and idiopathic neuropathy, unspecified: Secondary | ICD-10-CM | POA: Diagnosis not present

## 2015-07-29 DIAGNOSIS — M810 Age-related osteoporosis without current pathological fracture: Secondary | ICD-10-CM | POA: Diagnosis not present

## 2015-07-29 DIAGNOSIS — R279 Unspecified lack of coordination: Secondary | ICD-10-CM | POA: Diagnosis not present

## 2015-07-29 DIAGNOSIS — R269 Unspecified abnormalities of gait and mobility: Secondary | ICD-10-CM | POA: Diagnosis not present

## 2015-07-29 DIAGNOSIS — I872 Venous insufficiency (chronic) (peripheral): Secondary | ICD-10-CM | POA: Diagnosis not present

## 2015-07-29 DIAGNOSIS — F329 Major depressive disorder, single episode, unspecified: Secondary | ICD-10-CM | POA: Diagnosis not present

## 2015-07-30 DIAGNOSIS — I872 Venous insufficiency (chronic) (peripheral): Secondary | ICD-10-CM | POA: Diagnosis not present

## 2015-07-30 DIAGNOSIS — R269 Unspecified abnormalities of gait and mobility: Secondary | ICD-10-CM | POA: Diagnosis not present

## 2015-07-30 DIAGNOSIS — M810 Age-related osteoporosis without current pathological fracture: Secondary | ICD-10-CM | POA: Diagnosis not present

## 2015-07-30 DIAGNOSIS — G609 Hereditary and idiopathic neuropathy, unspecified: Secondary | ICD-10-CM | POA: Diagnosis not present

## 2015-07-30 DIAGNOSIS — F329 Major depressive disorder, single episode, unspecified: Secondary | ICD-10-CM | POA: Diagnosis not present

## 2015-07-30 DIAGNOSIS — R279 Unspecified lack of coordination: Secondary | ICD-10-CM | POA: Diagnosis not present

## 2015-08-06 DIAGNOSIS — I872 Venous insufficiency (chronic) (peripheral): Secondary | ICD-10-CM | POA: Diagnosis not present

## 2015-08-06 DIAGNOSIS — G609 Hereditary and idiopathic neuropathy, unspecified: Secondary | ICD-10-CM | POA: Diagnosis not present

## 2015-08-06 DIAGNOSIS — F329 Major depressive disorder, single episode, unspecified: Secondary | ICD-10-CM | POA: Diagnosis not present

## 2015-08-06 DIAGNOSIS — M810 Age-related osteoporosis without current pathological fracture: Secondary | ICD-10-CM | POA: Diagnosis not present

## 2015-08-06 DIAGNOSIS — R269 Unspecified abnormalities of gait and mobility: Secondary | ICD-10-CM | POA: Diagnosis not present

## 2015-08-06 DIAGNOSIS — R279 Unspecified lack of coordination: Secondary | ICD-10-CM | POA: Diagnosis not present

## 2015-08-12 DIAGNOSIS — F329 Major depressive disorder, single episode, unspecified: Secondary | ICD-10-CM | POA: Diagnosis not present

## 2015-08-12 DIAGNOSIS — I872 Venous insufficiency (chronic) (peripheral): Secondary | ICD-10-CM | POA: Diagnosis not present

## 2015-08-12 DIAGNOSIS — G609 Hereditary and idiopathic neuropathy, unspecified: Secondary | ICD-10-CM | POA: Diagnosis not present

## 2015-08-12 DIAGNOSIS — M810 Age-related osteoporosis without current pathological fracture: Secondary | ICD-10-CM | POA: Diagnosis not present

## 2015-08-12 DIAGNOSIS — R269 Unspecified abnormalities of gait and mobility: Secondary | ICD-10-CM | POA: Diagnosis not present

## 2015-08-12 DIAGNOSIS — R279 Unspecified lack of coordination: Secondary | ICD-10-CM | POA: Diagnosis not present

## 2015-08-14 DIAGNOSIS — R269 Unspecified abnormalities of gait and mobility: Secondary | ICD-10-CM | POA: Diagnosis not present

## 2015-08-14 DIAGNOSIS — R279 Unspecified lack of coordination: Secondary | ICD-10-CM | POA: Diagnosis not present

## 2015-08-14 DIAGNOSIS — F329 Major depressive disorder, single episode, unspecified: Secondary | ICD-10-CM | POA: Diagnosis not present

## 2015-08-14 DIAGNOSIS — M810 Age-related osteoporosis without current pathological fracture: Secondary | ICD-10-CM | POA: Diagnosis not present

## 2015-08-14 DIAGNOSIS — G609 Hereditary and idiopathic neuropathy, unspecified: Secondary | ICD-10-CM | POA: Diagnosis not present

## 2015-08-14 DIAGNOSIS — I872 Venous insufficiency (chronic) (peripheral): Secondary | ICD-10-CM | POA: Diagnosis not present

## 2015-08-18 DIAGNOSIS — H35343 Macular cyst, hole, or pseudohole, bilateral: Secondary | ICD-10-CM | POA: Diagnosis not present

## 2015-08-18 DIAGNOSIS — H35372 Puckering of macula, left eye: Secondary | ICD-10-CM | POA: Diagnosis not present

## 2015-08-19 DIAGNOSIS — R269 Unspecified abnormalities of gait and mobility: Secondary | ICD-10-CM | POA: Diagnosis not present

## 2015-08-19 DIAGNOSIS — M810 Age-related osteoporosis without current pathological fracture: Secondary | ICD-10-CM | POA: Diagnosis not present

## 2015-08-19 DIAGNOSIS — G609 Hereditary and idiopathic neuropathy, unspecified: Secondary | ICD-10-CM | POA: Diagnosis not present

## 2015-08-19 DIAGNOSIS — R279 Unspecified lack of coordination: Secondary | ICD-10-CM | POA: Diagnosis not present

## 2015-08-19 DIAGNOSIS — F329 Major depressive disorder, single episode, unspecified: Secondary | ICD-10-CM | POA: Diagnosis not present

## 2015-08-19 DIAGNOSIS — I872 Venous insufficiency (chronic) (peripheral): Secondary | ICD-10-CM | POA: Diagnosis not present

## 2015-08-20 DIAGNOSIS — R269 Unspecified abnormalities of gait and mobility: Secondary | ICD-10-CM | POA: Diagnosis not present

## 2015-08-20 DIAGNOSIS — R279 Unspecified lack of coordination: Secondary | ICD-10-CM | POA: Diagnosis not present

## 2015-08-20 DIAGNOSIS — M810 Age-related osteoporosis without current pathological fracture: Secondary | ICD-10-CM | POA: Diagnosis not present

## 2015-08-20 DIAGNOSIS — I872 Venous insufficiency (chronic) (peripheral): Secondary | ICD-10-CM | POA: Diagnosis not present

## 2015-08-20 DIAGNOSIS — G609 Hereditary and idiopathic neuropathy, unspecified: Secondary | ICD-10-CM | POA: Diagnosis not present

## 2015-08-20 DIAGNOSIS — F329 Major depressive disorder, single episode, unspecified: Secondary | ICD-10-CM | POA: Diagnosis not present

## 2015-08-21 DIAGNOSIS — I872 Venous insufficiency (chronic) (peripheral): Secondary | ICD-10-CM | POA: Diagnosis not present

## 2015-08-21 DIAGNOSIS — G609 Hereditary and idiopathic neuropathy, unspecified: Secondary | ICD-10-CM | POA: Diagnosis not present

## 2015-08-21 DIAGNOSIS — M810 Age-related osteoporosis without current pathological fracture: Secondary | ICD-10-CM | POA: Diagnosis not present

## 2015-08-21 DIAGNOSIS — R269 Unspecified abnormalities of gait and mobility: Secondary | ICD-10-CM | POA: Diagnosis not present

## 2015-08-21 DIAGNOSIS — R279 Unspecified lack of coordination: Secondary | ICD-10-CM | POA: Diagnosis not present

## 2015-08-21 DIAGNOSIS — F329 Major depressive disorder, single episode, unspecified: Secondary | ICD-10-CM | POA: Diagnosis not present

## 2015-08-26 DIAGNOSIS — G609 Hereditary and idiopathic neuropathy, unspecified: Secondary | ICD-10-CM | POA: Diagnosis not present

## 2015-08-26 DIAGNOSIS — I872 Venous insufficiency (chronic) (peripheral): Secondary | ICD-10-CM | POA: Diagnosis not present

## 2015-08-26 DIAGNOSIS — R279 Unspecified lack of coordination: Secondary | ICD-10-CM | POA: Diagnosis not present

## 2015-08-26 DIAGNOSIS — M810 Age-related osteoporosis without current pathological fracture: Secondary | ICD-10-CM | POA: Diagnosis not present

## 2015-08-26 DIAGNOSIS — F329 Major depressive disorder, single episode, unspecified: Secondary | ICD-10-CM | POA: Diagnosis not present

## 2015-08-26 DIAGNOSIS — R269 Unspecified abnormalities of gait and mobility: Secondary | ICD-10-CM | POA: Diagnosis not present

## 2015-08-28 DIAGNOSIS — J069 Acute upper respiratory infection, unspecified: Secondary | ICD-10-CM | POA: Diagnosis not present

## 2015-09-01 DIAGNOSIS — R279 Unspecified lack of coordination: Secondary | ICD-10-CM | POA: Diagnosis not present

## 2015-09-01 DIAGNOSIS — R269 Unspecified abnormalities of gait and mobility: Secondary | ICD-10-CM | POA: Diagnosis not present

## 2015-09-01 DIAGNOSIS — I872 Venous insufficiency (chronic) (peripheral): Secondary | ICD-10-CM | POA: Diagnosis not present

## 2015-09-01 DIAGNOSIS — G609 Hereditary and idiopathic neuropathy, unspecified: Secondary | ICD-10-CM | POA: Diagnosis not present

## 2015-09-01 DIAGNOSIS — M810 Age-related osteoporosis without current pathological fracture: Secondary | ICD-10-CM | POA: Diagnosis not present

## 2015-09-01 DIAGNOSIS — F329 Major depressive disorder, single episode, unspecified: Secondary | ICD-10-CM | POA: Diagnosis not present

## 2015-09-02 DIAGNOSIS — F329 Major depressive disorder, single episode, unspecified: Secondary | ICD-10-CM | POA: Diagnosis not present

## 2015-09-02 DIAGNOSIS — G609 Hereditary and idiopathic neuropathy, unspecified: Secondary | ICD-10-CM | POA: Diagnosis not present

## 2015-09-02 DIAGNOSIS — R269 Unspecified abnormalities of gait and mobility: Secondary | ICD-10-CM | POA: Diagnosis not present

## 2015-09-02 DIAGNOSIS — I872 Venous insufficiency (chronic) (peripheral): Secondary | ICD-10-CM | POA: Diagnosis not present

## 2015-09-02 DIAGNOSIS — R279 Unspecified lack of coordination: Secondary | ICD-10-CM | POA: Diagnosis not present

## 2015-09-02 DIAGNOSIS — M810 Age-related osteoporosis without current pathological fracture: Secondary | ICD-10-CM | POA: Diagnosis not present

## 2015-09-04 DIAGNOSIS — G609 Hereditary and idiopathic neuropathy, unspecified: Secondary | ICD-10-CM | POA: Diagnosis not present

## 2015-09-04 DIAGNOSIS — F329 Major depressive disorder, single episode, unspecified: Secondary | ICD-10-CM | POA: Diagnosis not present

## 2015-09-04 DIAGNOSIS — M810 Age-related osteoporosis without current pathological fracture: Secondary | ICD-10-CM | POA: Diagnosis not present

## 2015-09-04 DIAGNOSIS — R269 Unspecified abnormalities of gait and mobility: Secondary | ICD-10-CM | POA: Diagnosis not present

## 2015-09-04 DIAGNOSIS — I872 Venous insufficiency (chronic) (peripheral): Secondary | ICD-10-CM | POA: Diagnosis not present

## 2015-09-04 DIAGNOSIS — R279 Unspecified lack of coordination: Secondary | ICD-10-CM | POA: Diagnosis not present

## 2015-09-07 DIAGNOSIS — I872 Venous insufficiency (chronic) (peripheral): Secondary | ICD-10-CM | POA: Diagnosis not present

## 2015-09-07 DIAGNOSIS — R269 Unspecified abnormalities of gait and mobility: Secondary | ICD-10-CM | POA: Diagnosis not present

## 2015-09-07 DIAGNOSIS — F329 Major depressive disorder, single episode, unspecified: Secondary | ICD-10-CM | POA: Diagnosis not present

## 2015-09-07 DIAGNOSIS — M810 Age-related osteoporosis without current pathological fracture: Secondary | ICD-10-CM | POA: Diagnosis not present

## 2015-09-07 DIAGNOSIS — R279 Unspecified lack of coordination: Secondary | ICD-10-CM | POA: Diagnosis not present

## 2015-09-07 DIAGNOSIS — G609 Hereditary and idiopathic neuropathy, unspecified: Secondary | ICD-10-CM | POA: Diagnosis not present

## 2015-09-09 DIAGNOSIS — F329 Major depressive disorder, single episode, unspecified: Secondary | ICD-10-CM | POA: Diagnosis not present

## 2015-09-09 DIAGNOSIS — G609 Hereditary and idiopathic neuropathy, unspecified: Secondary | ICD-10-CM | POA: Diagnosis not present

## 2015-09-09 DIAGNOSIS — R269 Unspecified abnormalities of gait and mobility: Secondary | ICD-10-CM | POA: Diagnosis not present

## 2015-09-09 DIAGNOSIS — I872 Venous insufficiency (chronic) (peripheral): Secondary | ICD-10-CM | POA: Diagnosis not present

## 2015-09-09 DIAGNOSIS — M810 Age-related osteoporosis without current pathological fracture: Secondary | ICD-10-CM | POA: Diagnosis not present

## 2015-09-09 DIAGNOSIS — R279 Unspecified lack of coordination: Secondary | ICD-10-CM | POA: Diagnosis not present

## 2015-09-11 DIAGNOSIS — R269 Unspecified abnormalities of gait and mobility: Secondary | ICD-10-CM | POA: Diagnosis not present

## 2015-09-11 DIAGNOSIS — F329 Major depressive disorder, single episode, unspecified: Secondary | ICD-10-CM | POA: Diagnosis not present

## 2015-09-11 DIAGNOSIS — M810 Age-related osteoporosis without current pathological fracture: Secondary | ICD-10-CM | POA: Diagnosis not present

## 2015-09-11 DIAGNOSIS — I872 Venous insufficiency (chronic) (peripheral): Secondary | ICD-10-CM | POA: Diagnosis not present

## 2015-09-11 DIAGNOSIS — G609 Hereditary and idiopathic neuropathy, unspecified: Secondary | ICD-10-CM | POA: Diagnosis not present

## 2015-09-11 DIAGNOSIS — R279 Unspecified lack of coordination: Secondary | ICD-10-CM | POA: Diagnosis not present

## 2016-02-16 DIAGNOSIS — E7800 Pure hypercholesterolemia, unspecified: Secondary | ICD-10-CM | POA: Diagnosis not present

## 2016-02-16 DIAGNOSIS — I519 Heart disease, unspecified: Secondary | ICD-10-CM | POA: Diagnosis not present

## 2016-02-16 DIAGNOSIS — H35413 Lattice degeneration of retina, bilateral: Secondary | ICD-10-CM | POA: Diagnosis not present

## 2016-02-16 DIAGNOSIS — M79604 Pain in right leg: Secondary | ICD-10-CM | POA: Diagnosis not present

## 2016-02-16 DIAGNOSIS — G629 Polyneuropathy, unspecified: Secondary | ICD-10-CM | POA: Diagnosis not present

## 2016-02-16 DIAGNOSIS — R269 Unspecified abnormalities of gait and mobility: Secondary | ICD-10-CM | POA: Diagnosis not present

## 2016-02-16 DIAGNOSIS — F325 Major depressive disorder, single episode, in full remission: Secondary | ICD-10-CM | POA: Diagnosis not present

## 2016-02-16 DIAGNOSIS — Z79899 Other long term (current) drug therapy: Secondary | ICD-10-CM | POA: Diagnosis not present

## 2016-02-16 DIAGNOSIS — M79605 Pain in left leg: Secondary | ICD-10-CM | POA: Diagnosis not present

## 2016-02-16 DIAGNOSIS — Z0000 Encounter for general adult medical examination without abnormal findings: Secondary | ICD-10-CM | POA: Diagnosis not present

## 2016-02-16 DIAGNOSIS — I872 Venous insufficiency (chronic) (peripheral): Secondary | ICD-10-CM | POA: Diagnosis not present

## 2016-02-16 DIAGNOSIS — H35343 Macular cyst, hole, or pseudohole, bilateral: Secondary | ICD-10-CM | POA: Diagnosis not present

## 2016-02-16 DIAGNOSIS — R600 Localized edema: Secondary | ICD-10-CM | POA: Diagnosis not present

## 2016-02-16 DIAGNOSIS — H35372 Puckering of macula, left eye: Secondary | ICD-10-CM | POA: Diagnosis not present

## 2016-02-17 DIAGNOSIS — R600 Localized edema: Secondary | ICD-10-CM | POA: Diagnosis not present

## 2016-02-17 DIAGNOSIS — Z0000 Encounter for general adult medical examination without abnormal findings: Secondary | ICD-10-CM | POA: Diagnosis not present

## 2016-02-23 DIAGNOSIS — R21 Rash and other nonspecific skin eruption: Secondary | ICD-10-CM | POA: Diagnosis not present

## 2016-02-23 DIAGNOSIS — I872 Venous insufficiency (chronic) (peripheral): Secondary | ICD-10-CM | POA: Diagnosis not present

## 2016-03-15 DIAGNOSIS — I878 Other specified disorders of veins: Secondary | ICD-10-CM | POA: Diagnosis not present

## 2016-05-25 ENCOUNTER — Ambulatory Visit (INDEPENDENT_AMBULATORY_CARE_PROVIDER_SITE_OTHER): Payer: Medicare Other | Admitting: Family Medicine

## 2016-05-25 ENCOUNTER — Encounter: Payer: Self-pay | Admitting: Family Medicine

## 2016-05-25 VITALS — BP 144/55 | HR 89 | Temp 98.6°F | Wt 169.0 lb

## 2016-05-25 DIAGNOSIS — R251 Tremor, unspecified: Secondary | ICD-10-CM | POA: Diagnosis not present

## 2016-05-25 DIAGNOSIS — R600 Localized edema: Secondary | ICD-10-CM

## 2016-05-25 DIAGNOSIS — F329 Major depressive disorder, single episode, unspecified: Secondary | ICD-10-CM

## 2016-05-25 DIAGNOSIS — F32A Depression, unspecified: Secondary | ICD-10-CM

## 2016-05-25 DIAGNOSIS — R609 Edema, unspecified: Secondary | ICD-10-CM | POA: Insufficient documentation

## 2016-05-25 DIAGNOSIS — M159 Polyosteoarthritis, unspecified: Secondary | ICD-10-CM | POA: Diagnosis not present

## 2016-05-25 DIAGNOSIS — R269 Unspecified abnormalities of gait and mobility: Secondary | ICD-10-CM

## 2016-05-25 DIAGNOSIS — M25512 Pain in left shoulder: Secondary | ICD-10-CM | POA: Insufficient documentation

## 2016-05-25 NOTE — Progress Notes (Signed)
Subjective  Patient is presenting with the following illnesses  Edema of both legs Longstanding but recently imroved when she went to the beach and forgot her zoloft and also stopped eating popcorn. Has worsened since she returned.  No open sores or increased shortness of breath or other change of medications. Has been prescribed lasix in past but did not take  Depression Longstanding. Recently worsened since stopped zoloft. Feels tired lacks energy and has trouble sleeping.    Osteoarthritis diffuse Declares she has a high pain threshold. Has been prescribed gabapentin but mad her too sleeepy.  Currently takes 2 tylenol per day. No joint redness or skin lesions  Poor Mobility Has been using a walker and a WC intermittently for years but recently has been using more. Needs assistance to stand.  No focal weakness or sudden changes.  Has a tremor and has been told in past may have parkinsons but a neurologist did not think so.  Had Physical Therapy in January for gait.  Had a fall a few weeks ago hurt left shoulder.    Left Shoulder pain  From fall weeks ago.  No improvement or progression.  Seen by Dr Felipa Eth after fall. Has pain with range of motion and limited range of motion.  No distal pain or weakness   Chief Complaint noted Review of Symptoms - see HPI PMH - Smoking status noted.     Objective Vital Signs reviewed Alert nad Knows medications and dates Neck:  No deformities, thyromegaly, masses, or tenderness noted.   Supple with full range of motion without pain. Heart - Regular rate and rhythm. Gr2/6 diffuse murmur no, gallops or rubs.    Abdomen: soft and non-tender without masses, organomegaly or hernias noted.  No guarding or rebound Extrem - thick legs with mild pitting.  No focal redness. No skin breakdown. Skin:  Intact without suspicious lesions or rashes Lungs:  Normal respiratory effort, chest expands symmetrically. Lungs are clear to auscultation, no crackles or  wheezes. Left Shoulder - decreased range of motion distal str intact Perrl Eomi Mouth - no lesions, mucous membranes are moist, no decaying teeth  Gait - unable to stand without assistance but can walk using walker without severe shortness of breath or pain   Assessments/Plans  See Encounter view if individual problem A/Ps not visible See after visit summary for details of patient instuctions

## 2016-05-25 NOTE — Assessment & Plan Note (Signed)
Stable.  She is not interested in additional pain medication.  Will try increased frequency of tylenol

## 2016-05-25 NOTE — Patient Instructions (Signed)
Good to see you today!  Thanks for coming in.  Restart zoloft - if the swelling gets much worse - call me   Push yourself to walk and rise without assistance or minimal assitance  I will see if I can order Physical Therapy  Tylenol arthritis 2 three times a day   Elevate your legs as much as possible  Support stockings first thing in the am  I will look at Dr Felipa Eth records  Come back in 1 month

## 2016-05-25 NOTE — Assessment & Plan Note (Signed)
Worsened.  She stopped Zoloft about a month ago and symptoms have worsened.  Will restart and monitor for improvement or side effects

## 2016-05-25 NOTE — Assessment & Plan Note (Signed)
After a fall. Given ability to move I do not think she has a fracture but likely a rotator cuff partial tear

## 2016-05-25 NOTE — Assessment & Plan Note (Signed)
Worsening apparently for years.  Needs help to stand (either aide or electric chair)  Has been using WC more often.  See notes under tremor.   Was sen by Physical Therapy in January.   Will see if can reorder evaluation

## 2016-05-25 NOTE — Assessment & Plan Note (Signed)
Has been chronic. Seemed to improve when stopped zoloft and popcorn.  Does not like to elevate legs nor wear stockings but have recommended both.  Will send for labs and records

## 2016-06-08 ENCOUNTER — Telehealth: Payer: Self-pay | Admitting: *Deleted

## 2016-06-08 DIAGNOSIS — R269 Unspecified abnormalities of gait and mobility: Secondary | ICD-10-CM

## 2016-06-08 NOTE — Telephone Encounter (Signed)
Will forward to MD to place a referral for home health asking for PT eval at home. Jelitza Manninen,CMA

## 2016-06-08 NOTE — Telephone Encounter (Signed)
Pt calling in stating that dr Erin Hearing put in a referral for PT and she would have to go there. She wants to know if she could have somebody come to her home due to not having transportation. Please advise. Deseree Kennon Holter, CMA

## 2016-06-09 NOTE — Telephone Encounter (Signed)
Helen Odom

## 2016-06-16 DIAGNOSIS — I10 Essential (primary) hypertension: Secondary | ICD-10-CM | POA: Diagnosis not present

## 2016-06-16 DIAGNOSIS — M25512 Pain in left shoulder: Secondary | ICD-10-CM | POA: Diagnosis not present

## 2016-06-16 DIAGNOSIS — Z7982 Long term (current) use of aspirin: Secondary | ICD-10-CM | POA: Diagnosis not present

## 2016-06-16 DIAGNOSIS — R600 Localized edema: Secondary | ICD-10-CM | POA: Diagnosis not present

## 2016-06-16 DIAGNOSIS — Z9181 History of falling: Secondary | ICD-10-CM | POA: Diagnosis not present

## 2016-06-16 DIAGNOSIS — M159 Polyosteoarthritis, unspecified: Secondary | ICD-10-CM | POA: Diagnosis not present

## 2016-06-16 DIAGNOSIS — R251 Tremor, unspecified: Secondary | ICD-10-CM | POA: Diagnosis not present

## 2016-06-16 DIAGNOSIS — R2689 Other abnormalities of gait and mobility: Secondary | ICD-10-CM | POA: Diagnosis not present

## 2016-06-16 DIAGNOSIS — F329 Major depressive disorder, single episode, unspecified: Secondary | ICD-10-CM | POA: Diagnosis not present

## 2016-06-20 ENCOUNTER — Telehealth: Payer: Self-pay | Admitting: Family Medicine

## 2016-06-20 NOTE — Telephone Encounter (Signed)
Phone in orders.

## 2016-06-20 NOTE — Telephone Encounter (Signed)
Will forward to MD. Jazmin Hartsell,CMA  

## 2016-06-20 NOTE — Telephone Encounter (Signed)
LiJi called from [facility] to request verbal orders for the patient. She is asking for home health PT  1 time for 1 week 2 times for 3 weeks 1 time for 1 week. Please call her at [phone] Jw

## 2016-06-20 NOTE — Telephone Encounter (Signed)
Pls call in those orders  Thanks!

## 2016-06-22 ENCOUNTER — Ambulatory Visit (INDEPENDENT_AMBULATORY_CARE_PROVIDER_SITE_OTHER): Payer: Medicare Other | Admitting: Family Medicine

## 2016-06-22 ENCOUNTER — Encounter: Payer: Self-pay | Admitting: Family Medicine

## 2016-06-22 DIAGNOSIS — R600 Localized edema: Secondary | ICD-10-CM | POA: Diagnosis not present

## 2016-06-22 DIAGNOSIS — M25512 Pain in left shoulder: Secondary | ICD-10-CM | POA: Diagnosis not present

## 2016-06-22 DIAGNOSIS — F32A Depression, unspecified: Secondary | ICD-10-CM

## 2016-06-22 DIAGNOSIS — E669 Obesity, unspecified: Secondary | ICD-10-CM | POA: Diagnosis not present

## 2016-06-22 DIAGNOSIS — M159 Polyosteoarthritis, unspecified: Secondary | ICD-10-CM | POA: Diagnosis not present

## 2016-06-22 DIAGNOSIS — R2689 Other abnormalities of gait and mobility: Secondary | ICD-10-CM | POA: Diagnosis not present

## 2016-06-22 DIAGNOSIS — F329 Major depressive disorder, single episode, unspecified: Secondary | ICD-10-CM | POA: Diagnosis not present

## 2016-06-22 DIAGNOSIS — R251 Tremor, unspecified: Secondary | ICD-10-CM | POA: Diagnosis not present

## 2016-06-22 MED ORDER — PAROXETINE HCL 10 MG PO TABS: 10.0000 mg | ORAL_TABLET | Freq: Every day | ORAL | 3 refills | Status: DC

## 2016-06-22 MED ORDER — HYDROCORTISONE 2.5 % EX OINT: TOPICAL_OINTMENT | Freq: Two times a day (BID) | CUTANEOUS | 5 refills | Status: DC

## 2016-06-22 NOTE — Patient Instructions (Addendum)
Good to see you today!  Thanks for coming in.  Take the Paxil 1/2 tab daily for 2 weeks if not helping enough go up to 1 tab per day  Elevate your legs whenever possible and wear stockings use the ointment twice a day  Exercise three times a day upper arms range of motion and wall walk Lower limbs - strength use the bands  Cut back on your portions of high calorie foods  Bring all your medications bottles next visit  Come back in 2 months

## 2016-06-22 NOTE — Progress Notes (Signed)
Subjective  Patient is presenting with the following illnesses  Edema of both legs She feels this is improved.  Wearing compression stockings sometime (not today).  Scaling of legs seems better.  No shortness of breath or skin breakdown  Depression/Anxiety Feels this is still very bothersome.  Resarted zoloft but thinks giving her diarreha.  Feels she needs some type of medication.   Does not sleep well for year.  No suicidal ideation    Poor Mobility Feels her poor arm strength is an issue.  Can't push herself up. Can pull to a stand.   Physical Therapy has started at home.  Uses a walker or WC all the time.  No falls since last visit.  No progressive weakness    Left Shoulder pain  Continues to have pain. Not doing any specific exercises.  Takes tylenol occsl but doe not feel needs pain medications.   No hand weakness    Chief Complaint noted Review of Symptoms - see HPI PMH - Smoking status noted.     Objective Vital Signs reviewed Alert interactive Legs - trace pitting edema and lipidedema.  No skin breakdown.  Good distal ankle strength Psych:  Cognition and judgment appear intact. Alert, communicative  and cooperative with normal attention span and concentration. No apparent delusions, illusions, hallucinations L shoulder - decreased range of motion normal grip strenght    Assessments/Plans  No problem-specific Assessment & Plan notes found for this encounter.   See Encounter view if individual problem A/Ps not visible See after visit summary for details of patient instuctions

## 2016-06-23 DIAGNOSIS — R2689 Other abnormalities of gait and mobility: Secondary | ICD-10-CM | POA: Diagnosis not present

## 2016-06-23 DIAGNOSIS — R600 Localized edema: Secondary | ICD-10-CM | POA: Diagnosis not present

## 2016-06-23 DIAGNOSIS — F329 Major depressive disorder, single episode, unspecified: Secondary | ICD-10-CM | POA: Diagnosis not present

## 2016-06-23 DIAGNOSIS — R251 Tremor, unspecified: Secondary | ICD-10-CM | POA: Diagnosis not present

## 2016-06-23 DIAGNOSIS — E669 Obesity, unspecified: Secondary | ICD-10-CM | POA: Insufficient documentation

## 2016-06-23 DIAGNOSIS — M159 Polyosteoarthritis, unspecified: Secondary | ICD-10-CM | POA: Diagnosis not present

## 2016-06-23 DIAGNOSIS — M25512 Pain in left shoulder: Secondary | ICD-10-CM | POA: Diagnosis not present

## 2016-06-23 NOTE — Assessment & Plan Note (Signed)
Unchanged.  Likely rotator cuff injury.  Continue Physical Therapy.  Discussed range of motion exercise and possible joint injection.  She is not interested in surgery

## 2016-06-23 NOTE — Assessment & Plan Note (Signed)
Improved. Continue current therapy.

## 2016-06-23 NOTE — Assessment & Plan Note (Signed)
Worsened.  Will start low dose paxil and monitor.  This seems to be a longstanding dysthymia.  Increased ambulation participation may help as well

## 2016-06-23 NOTE — Assessment & Plan Note (Signed)
Central obesity that limits her mobility.  Will work to improve muscle strenght and weight loss

## 2016-06-28 DIAGNOSIS — M25512 Pain in left shoulder: Secondary | ICD-10-CM | POA: Diagnosis not present

## 2016-06-28 DIAGNOSIS — R2689 Other abnormalities of gait and mobility: Secondary | ICD-10-CM | POA: Diagnosis not present

## 2016-06-28 DIAGNOSIS — R600 Localized edema: Secondary | ICD-10-CM | POA: Diagnosis not present

## 2016-06-28 DIAGNOSIS — F329 Major depressive disorder, single episode, unspecified: Secondary | ICD-10-CM | POA: Diagnosis not present

## 2016-06-28 DIAGNOSIS — R251 Tremor, unspecified: Secondary | ICD-10-CM | POA: Diagnosis not present

## 2016-06-28 DIAGNOSIS — M159 Polyosteoarthritis, unspecified: Secondary | ICD-10-CM | POA: Diagnosis not present

## 2016-06-30 DIAGNOSIS — M25512 Pain in left shoulder: Secondary | ICD-10-CM | POA: Diagnosis not present

## 2016-06-30 DIAGNOSIS — F329 Major depressive disorder, single episode, unspecified: Secondary | ICD-10-CM | POA: Diagnosis not present

## 2016-06-30 DIAGNOSIS — R600 Localized edema: Secondary | ICD-10-CM | POA: Diagnosis not present

## 2016-06-30 DIAGNOSIS — R2689 Other abnormalities of gait and mobility: Secondary | ICD-10-CM | POA: Diagnosis not present

## 2016-06-30 DIAGNOSIS — R251 Tremor, unspecified: Secondary | ICD-10-CM | POA: Diagnosis not present

## 2016-06-30 DIAGNOSIS — M159 Polyosteoarthritis, unspecified: Secondary | ICD-10-CM | POA: Diagnosis not present

## 2016-07-05 DIAGNOSIS — M159 Polyosteoarthritis, unspecified: Secondary | ICD-10-CM | POA: Diagnosis not present

## 2016-07-05 DIAGNOSIS — M25512 Pain in left shoulder: Secondary | ICD-10-CM | POA: Diagnosis not present

## 2016-07-05 DIAGNOSIS — R600 Localized edema: Secondary | ICD-10-CM | POA: Diagnosis not present

## 2016-07-05 DIAGNOSIS — R251 Tremor, unspecified: Secondary | ICD-10-CM | POA: Diagnosis not present

## 2016-07-05 DIAGNOSIS — R2689 Other abnormalities of gait and mobility: Secondary | ICD-10-CM | POA: Diagnosis not present

## 2016-07-05 DIAGNOSIS — F329 Major depressive disorder, single episode, unspecified: Secondary | ICD-10-CM | POA: Diagnosis not present

## 2016-07-07 DIAGNOSIS — R2689 Other abnormalities of gait and mobility: Secondary | ICD-10-CM | POA: Diagnosis not present

## 2016-07-07 DIAGNOSIS — F329 Major depressive disorder, single episode, unspecified: Secondary | ICD-10-CM | POA: Diagnosis not present

## 2016-07-07 DIAGNOSIS — M159 Polyosteoarthritis, unspecified: Secondary | ICD-10-CM | POA: Diagnosis not present

## 2016-07-07 DIAGNOSIS — R600 Localized edema: Secondary | ICD-10-CM | POA: Diagnosis not present

## 2016-07-07 DIAGNOSIS — R251 Tremor, unspecified: Secondary | ICD-10-CM | POA: Diagnosis not present

## 2016-07-07 DIAGNOSIS — M25512 Pain in left shoulder: Secondary | ICD-10-CM | POA: Diagnosis not present

## 2016-07-14 DIAGNOSIS — R2689 Other abnormalities of gait and mobility: Secondary | ICD-10-CM | POA: Diagnosis not present

## 2016-07-14 DIAGNOSIS — M25512 Pain in left shoulder: Secondary | ICD-10-CM | POA: Diagnosis not present

## 2016-07-14 DIAGNOSIS — F329 Major depressive disorder, single episode, unspecified: Secondary | ICD-10-CM | POA: Diagnosis not present

## 2016-07-14 DIAGNOSIS — M159 Polyosteoarthritis, unspecified: Secondary | ICD-10-CM | POA: Diagnosis not present

## 2016-07-14 DIAGNOSIS — R600 Localized edema: Secondary | ICD-10-CM | POA: Diagnosis not present

## 2016-07-14 DIAGNOSIS — R251 Tremor, unspecified: Secondary | ICD-10-CM | POA: Diagnosis not present

## 2016-08-24 ENCOUNTER — Ambulatory Visit: Payer: Medicare Other | Admitting: Family Medicine

## 2016-09-05 ENCOUNTER — Telehealth: Payer: Self-pay | Admitting: Family Medicine

## 2016-09-05 NOTE — Telephone Encounter (Signed)
Will forward to PCP.  Xyon Lukasik L, RN  

## 2016-09-05 NOTE — Telephone Encounter (Signed)
Spoke with her Recommend come in for an appointment She agrees.  Nothing really urgent as far as her mood.  Would like to try something else beside zoloft or paxil

## 2016-09-05 NOTE — Telephone Encounter (Signed)
Pt states medication was changed to Paxil and it is not working. Pt would like to try something else. Pt uses [facility] @ [facility]. Please advise. Thanks! ep

## 2016-09-14 ENCOUNTER — Ambulatory Visit (INDEPENDENT_AMBULATORY_CARE_PROVIDER_SITE_OTHER): Payer: Medicare Other | Admitting: Family Medicine

## 2016-09-14 DIAGNOSIS — F3289 Other specified depressive episodes: Secondary | ICD-10-CM | POA: Diagnosis present

## 2016-09-14 DIAGNOSIS — R600 Localized edema: Secondary | ICD-10-CM

## 2016-09-14 DIAGNOSIS — R269 Unspecified abnormalities of gait and mobility: Secondary | ICD-10-CM

## 2016-09-14 MED ORDER — SERTRALINE HCL 25 MG PO TABS: 25.0000 mg | ORAL_TABLET | Freq: Every day | ORAL | 1 refills | Status: DC

## 2016-09-14 NOTE — Progress Notes (Signed)
Subjective  Patient is presenting with the following illnesses  Depression  Feels the paxil is not helping and that was in retrsopect better on zoloft.  Thinks she stopped this due to GI side effects (my old notes reported she felt it caused edema) Would like to restart zoloft and see.   PHQ-9 = 14.  Feels she gets very irritable with people.  Never has slept well  Mobility Did not feel that home Physical Therapy helped.  She is able to walk but need assistance to stand.   Told had Parkinson's like problems in past and tried one medicine but did not seem to help.     Edema - about the same.  Not currently wearing support hose.  No skin breakdown.     Chief Complaint noted Review of Symptoms - see HPI PMH - Smoking status noted.     Objective Vital Signs reviewed Alert interactive Flat facies and cogwheeling of knees Edema - 2+ at ankles  Psych:  Cognition and judgment appear intact. Alert, communicative  and cooperative with normal attention span and concentration. No apparent delusions, illusions, hallucinations     Assessments/Plans  No problem-specific Assessment & Plan notes found for this encounter.   See Encounter view if individual problem A/Ps not visible See after visit summary for details of patient instuctions

## 2016-09-14 NOTE — Patient Instructions (Addendum)
Good to see you today!  Thanks for coming in.  Start the zoloft sertraline 25 mg in the AM for one week then can go up to 2 tabs in the AM if no side effects  Stop the Paxil paroxetine for 2 days before you start the [facility] and see if can switch to another physician you may work better with,  If not let me know and I will see if there is another in town  Come back in  6 weeks for a recheck

## 2016-09-15 ENCOUNTER — Telehealth: Payer: Self-pay | Admitting: Neurology

## 2016-09-15 NOTE — Assessment & Plan Note (Signed)
Stable.  Encourage to wear stockings

## 2016-09-15 NOTE — Assessment & Plan Note (Addendum)
Un changed.  Certainly a component of deconditioning but has definite Parkinson's like features.  Seen by a local neurologist ? Name in past.  Does not want to see this particular doctor again but open to further evaluation.  Recommend she contact the practice to see if they would allow her to see another practiioner there.  If not will try other referral.  Need to watch for worsening with depression treatments

## 2016-09-15 NOTE — Assessment & Plan Note (Signed)
Worsened.  Will stop paxil and restart zoloft at low dose and monitor.  Could be a component of Parkinsons?   See Mobility Problem

## 2016-09-15 NOTE — Telephone Encounter (Signed)
Pt called said she saw Dr Talbert Cage today and he advised her to be seen by her neurologist for questionable parkinson's. She advised she is having difficulty picking up her feet, and she has tremors in the feet, legs and hands.  Pt said she told Dr Erin Hearing she wanted to see a different neurologist in the clinic because "they didn't seem to click". She advised Dr Erin Hearing recommended Dr Jaynee Eagles. So she would like to change providers. Pt was advised the msg would be sent to both providers and she would rec a call with the decision

## 2016-09-15 NOTE — Telephone Encounter (Signed)
It is okay with me.

## 2016-09-15 NOTE — Telephone Encounter (Signed)
Of course this is fine with me if ok with richard thanks

## 2016-09-16 NOTE — Telephone Encounter (Signed)
Called and LVM for pt to call and schedule appt with AA,MD. **Please schedule pt for next f/u with Dr Jaynee Eagles if she calls

## 2016-09-16 NOTE — Telephone Encounter (Signed)
Per AA,MD use next available f/u slot.

## 2016-09-19 NOTE — Telephone Encounter (Signed)
Dr Ahern- FYI 

## 2016-09-19 NOTE — Telephone Encounter (Signed)
Pt scheduled for 10/17/16 with AA,MD.

## 2016-09-20 ENCOUNTER — Other Ambulatory Visit: Payer: Self-pay | Admitting: *Deleted

## 2016-09-20 MED ORDER — TRIAMCINOLONE ACETONIDE 0.025 % EX CREA: 1.0000 "application " | TOPICAL_CREAM | Freq: Two times a day (BID) | CUTANEOUS | 6 refills | Status: DC

## 2016-10-11 ENCOUNTER — Inpatient Hospital Stay (HOSPITAL_COMMUNITY)
Admission: EM | Admit: 2016-10-11 | Discharge: 2016-10-17 | DRG: 481 | Disposition: A | Discharge status: Skilled Nursing Facility | Payer: Medicare Other | Attending: Family Medicine | Admitting: Family Medicine

## 2016-10-11 ENCOUNTER — Emergency Department (HOSPITAL_COMMUNITY): Payer: Medicare Other

## 2016-10-11 ENCOUNTER — Encounter (HOSPITAL_COMMUNITY): Payer: Self-pay | Admitting: Emergency Medicine

## 2016-10-11 DIAGNOSIS — M25562 Pain in left knee: Secondary | ICD-10-CM | POA: Diagnosis not present

## 2016-10-11 DIAGNOSIS — M25561 Pain in right knee: Secondary | ICD-10-CM | POA: Diagnosis not present

## 2016-10-11 DIAGNOSIS — W19XXXA Unspecified fall, initial encounter: Secondary | ICD-10-CM | POA: Diagnosis not present

## 2016-10-11 DIAGNOSIS — G259 Extrapyramidal and movement disorder, unspecified: Secondary | ICD-10-CM | POA: Diagnosis present

## 2016-10-11 DIAGNOSIS — R0602 Shortness of breath: Secondary | ICD-10-CM

## 2016-10-11 DIAGNOSIS — Z79899 Other long term (current) drug therapy: Secondary | ICD-10-CM | POA: Diagnosis not present

## 2016-10-11 DIAGNOSIS — Z85038 Personal history of other malignant neoplasm of large intestine: Secondary | ICD-10-CM | POA: Diagnosis not present

## 2016-10-11 DIAGNOSIS — F330 Major depressive disorder, recurrent, mild: Secondary | ICD-10-CM | POA: Diagnosis not present

## 2016-10-11 DIAGNOSIS — M810 Age-related osteoporosis without current pathological fracture: Secondary | ICD-10-CM | POA: Diagnosis present

## 2016-10-11 DIAGNOSIS — M8000XA Age-related osteoporosis with current pathological fracture, unspecified site, initial encounter for fracture: Secondary | ICD-10-CM | POA: Diagnosis not present

## 2016-10-11 DIAGNOSIS — Z8249 Family history of ischemic heart disease and other diseases of the circulatory system: Secondary | ICD-10-CM

## 2016-10-11 DIAGNOSIS — M79672 Pain in left foot: Secondary | ICD-10-CM | POA: Diagnosis not present

## 2016-10-11 DIAGNOSIS — K5903 Drug induced constipation: Secondary | ICD-10-CM | POA: Diagnosis not present

## 2016-10-11 DIAGNOSIS — F329 Major depressive disorder, single episode, unspecified: Secondary | ICD-10-CM | POA: Diagnosis not present

## 2016-10-11 DIAGNOSIS — T402X5A Adverse effect of other opioids, initial encounter: Secondary | ICD-10-CM | POA: Diagnosis not present

## 2016-10-11 DIAGNOSIS — Z880 Allergy status to penicillin: Secondary | ICD-10-CM

## 2016-10-11 DIAGNOSIS — S72001A Fracture of unspecified part of neck of right femur, initial encounter for closed fracture: Principal | ICD-10-CM | POA: Diagnosis present

## 2016-10-11 DIAGNOSIS — R0902 Hypoxemia: Secondary | ICD-10-CM | POA: Diagnosis present

## 2016-10-11 DIAGNOSIS — Y92009 Unspecified place in unspecified non-institutional (private) residence as the place of occurrence of the external cause: Secondary | ICD-10-CM

## 2016-10-11 DIAGNOSIS — Z887 Allergy status to serum and vaccine status: Secondary | ICD-10-CM | POA: Diagnosis not present

## 2016-10-11 DIAGNOSIS — Y92099 Unspecified place in other non-institutional residence as the place of occurrence of the external cause: Secondary | ICD-10-CM | POA: Diagnosis not present

## 2016-10-11 DIAGNOSIS — R278 Other lack of coordination: Secondary | ICD-10-CM | POA: Diagnosis not present

## 2016-10-11 DIAGNOSIS — R1311 Dysphagia, oral phase: Secondary | ICD-10-CM | POA: Diagnosis not present

## 2016-10-11 DIAGNOSIS — Z7982 Long term (current) use of aspirin: Secondary | ICD-10-CM

## 2016-10-11 DIAGNOSIS — M170 Bilateral primary osteoarthritis of knee: Secondary | ICD-10-CM | POA: Diagnosis not present

## 2016-10-11 DIAGNOSIS — Z419 Encounter for procedure for purposes other than remedying health state, unspecified: Secondary | ICD-10-CM

## 2016-10-11 DIAGNOSIS — M79671 Pain in right foot: Secondary | ICD-10-CM | POA: Diagnosis not present

## 2016-10-11 DIAGNOSIS — Z7409 Other reduced mobility: Secondary | ICD-10-CM

## 2016-10-11 DIAGNOSIS — E785 Hyperlipidemia, unspecified: Secondary | ICD-10-CM | POA: Diagnosis present

## 2016-10-11 DIAGNOSIS — R0600 Dyspnea, unspecified: Secondary | ICD-10-CM | POA: Diagnosis not present

## 2016-10-11 DIAGNOSIS — R531 Weakness: Secondary | ICD-10-CM | POA: Diagnosis not present

## 2016-10-11 DIAGNOSIS — S299XXA Unspecified injury of thorax, initial encounter: Secondary | ICD-10-CM | POA: Diagnosis not present

## 2016-10-11 DIAGNOSIS — S72009D Fracture of unspecified part of neck of unspecified femur, subsequent encounter for closed fracture with routine healing: Secondary | ICD-10-CM | POA: Diagnosis not present

## 2016-10-11 DIAGNOSIS — R0609 Other forms of dyspnea: Secondary | ICD-10-CM | POA: Diagnosis not present

## 2016-10-11 DIAGNOSIS — Z5181 Encounter for therapeutic drug level monitoring: Secondary | ICD-10-CM | POA: Diagnosis not present

## 2016-10-11 DIAGNOSIS — S8992XA Unspecified injury of left lower leg, initial encounter: Secondary | ICD-10-CM | POA: Diagnosis not present

## 2016-10-11 DIAGNOSIS — S99922A Unspecified injury of left foot, initial encounter: Secondary | ICD-10-CM | POA: Diagnosis not present

## 2016-10-11 DIAGNOSIS — W1830XA Fall on same level, unspecified, initial encounter: Secondary | ICD-10-CM | POA: Diagnosis present

## 2016-10-11 DIAGNOSIS — R030 Elevated blood-pressure reading, without diagnosis of hypertension: Secondary | ICD-10-CM | POA: Diagnosis not present

## 2016-10-11 DIAGNOSIS — S99921A Unspecified injury of right foot, initial encounter: Secondary | ICD-10-CM | POA: Diagnosis not present

## 2016-10-11 DIAGNOSIS — W19XXXD Unspecified fall, subsequent encounter: Secondary | ICD-10-CM | POA: Diagnosis not present

## 2016-10-11 DIAGNOSIS — R2689 Other abnormalities of gait and mobility: Secondary | ICD-10-CM | POA: Diagnosis not present

## 2016-10-11 DIAGNOSIS — Z4789 Encounter for other orthopedic aftercare: Secondary | ICD-10-CM | POA: Diagnosis not present

## 2016-10-11 DIAGNOSIS — S72009A Fracture of unspecified part of neck of unspecified femur, initial encounter for closed fracture: Secondary | ICD-10-CM | POA: Diagnosis not present

## 2016-10-11 DIAGNOSIS — S72001D Fracture of unspecified part of neck of right femur, subsequent encounter for closed fracture with routine healing: Secondary | ICD-10-CM | POA: Diagnosis not present

## 2016-10-11 LAB — BASIC METABOLIC PANEL
ANION GAP: 13 (ref 5–15)
BUN: 21 mg/dL — ABNORMAL HIGH (ref 6–20)
CALCIUM: 9.5 mg/dL (ref 8.9–10.3)
CO2: 21 mmol/L — ABNORMAL LOW (ref 22–32)
CREATININE: 0.84 mg/dL (ref 0.44–1.00)
Chloride: 102 mmol/L (ref 101–111)
GLUCOSE: 105 mg/dL — AB (ref 65–99)
Potassium: 4.3 mmol/L (ref 3.5–5.1)
Sodium: 136 mmol/L (ref 135–145)

## 2016-10-11 LAB — CBC WITH DIFFERENTIAL/PLATELET
BASOS ABS: 0 10*3/uL (ref 0.0–0.1)
BASOS PCT: 0 %
EOS ABS: 0.3 10*3/uL (ref 0.0–0.7)
Eosinophils Relative: 3 %
HEMATOCRIT: 38.6 % (ref 36.0–46.0)
Hemoglobin: 12.2 g/dL (ref 12.0–15.0)
Lymphocytes Relative: 9 %
Lymphs Abs: 1.1 10*3/uL (ref 0.7–4.0)
MCH: 29 pg (ref 26.0–34.0)
MCHC: 31.6 g/dL (ref 30.0–36.0)
MCV: 91.9 fL (ref 78.0–100.0)
MONO ABS: 0.6 10*3/uL (ref 0.1–1.0)
MONOS PCT: 6 %
NEUTROS ABS: 9.6 10*3/uL — AB (ref 1.7–7.7)
Neutrophils Relative %: 82 %
PLATELETS: 263 10*3/uL (ref 150–400)
RBC: 4.2 MIL/uL (ref 3.87–5.11)
RDW: 14.5 % (ref 11.5–15.5)
WBC: 11.7 10*3/uL — ABNORMAL HIGH (ref 4.0–10.5)

## 2016-10-11 LAB — PROTIME-INR
INR: 1
PROTHROMBIN TIME: 13.2 s (ref 11.4–15.2)

## 2016-10-11 LAB — TYPE AND SCREEN
ABO/RH(D): O POS
Antibody Screen: NEGATIVE

## 2016-10-11 LAB — ABO/RH: ABO/RH(D): O POS

## 2016-10-11 MED ORDER — FENTANYL CITRATE (PF) 100 MCG/2ML IJ SOLN
25.0000 ug | Freq: Once | INTRAMUSCULAR | Status: AC
  Administered 2016-10-11: 25 ug via INTRAVENOUS
  Filled 2016-10-11: qty 2

## 2016-10-11 MED ORDER — SODIUM CHLORIDE 0.9 % IV SOLN
INTRAVENOUS | Status: DC
  Administered 2016-10-11 – 2016-10-13 (×4): via INTRAVENOUS

## 2016-10-11 NOTE — ED Notes (Signed)
Pt assisted on bedpan.

## 2016-10-11 NOTE — ED Notes (Signed)
Patient transported to X-ray 

## 2016-10-11 NOTE — Progress Notes (Signed)
[facility] Admission History and Physical Service Pager: [phone]  Patient name: Helen Odom Medical record number: [id number] Date of birth: 1930/11/25 Age: 82 y.o. Gender: female  Primary Care Provider: Lind Covert, MD Consultants: ortho Code Status: Partial Code - DNI  Chief Complaint: mechanical fall, R impacted femur fracture  Assessment and Plan: Helen Odom is a 82 y.o. female presenting with R impacted femoral neck fracture after mechanical fall. PMH is significant for osteoporosis (didn't tolerate bisphosphonates or fosamax - GI upset), depression (currently on zoloft), gait abnormality (hx of eval for parkinson's without diagosis), hx of colon cancer.  # R impacted femoral neck fracture: new after mechanical fall. ED MD consulted Dr. Percell Miller for ortho, who asked that she be NPO for possible surgery 12/6 am. Patient denies pain at rest. Has some associated bilateral knee pain and left medial malleolus tenderness (no bony abnormalities identified outside of femur fracture) -admit to med surg, Nori Riis attending -NPO after midnight -MIVF  -pain control with morphine 1-2mg  , will reevaluate if needed for additional pain control  -bed rest  -neurovascular checks q4 hr -sennakot PRN for constipation in the setting of opioid use  # Hypoxia: one sat of 88% in ED, added 3L [place]. Patient satting 91% after I removed O2 on my exam. No respiratory history or reason for hypoxia on CXR, likely poor respiratory effort in the setting of pain, but cannot r/o possible suppression of respiratory drive 2/2 opiates.  -incentive spirometry -O2 to maintain sats >92 -attempt to use some restraint when providing PRN pain relief due to concern for suppression of respiratory drive and patient's increased risk for hospital associated delirium  # Depression: worsening per PCP notes.  -continue home zoloft -consider chaplain consult  # Gait abnormality, tremor:  Reported per PCP notes - needs aid or electric chair to stand, uses wheel chair occasionally. Patient uses walker at home. Deferred gait exam as patient unable to ambulate due to pain at present.  -PT/OT -fall precautions -Bed rest until cleared by ortho  # Osteoporosis: patient unable to tolerate fosamax or bisphosphonates per chart review.   -continue home vitamin D   # Osteoarthritis: Patient reports pain in bilateral knees at baseline, worsened after fall. Takes 2 tylenol at home, reports high pain tolerance. -pain control as above for hip fracture  FEN/GI: NPO after MN Prophylaxis: SCDs and heparin  Disposition: admit to med surg  History of Present Illness:  Helen Odom is a 82 y.o. female presenting with R impacted femoral neck fracture after mechanical fall at home. Patient was ambulating to the bathroom by herself at home at 4am using her walker, when she states her "knee gave out." She has 24 hour caregivers, along with her husband, who seems to have advanced dementia. She reports she has fallen 3 times in the past 2 months. Dr. Erin Hearing' notes report worsening gait abnormality, for which she has been evaluated by neurology 11/2014, felt not Parkinson's, but considering reconsulting. Reports the fentanyl helped her, is not in pain as long as she doesn't have to move her leg.   In ED, initial O2 sat 88% > 3L [place] applied. Otherwise VSS. XR with impacted R femoral neck fracture, given fentanyl 74mcg and ortho was consulted. Ortho requested NPO after MN and possible pinning tomorrow.   Patient's niece, [name], is her HCPOA. Discussed code status with patient and niece on speaker phone, patient mentions she has been DNR in the past, but needs  to be around for her husband, so she and her niece agreed to partial code DNI.   Review Of Systems: Per HPI with the following additions: no pain other than hips and knees.   ROS  Patient Active Problem List   Diagnosis Date Noted   . Hip fracture ([place]) 10/11/2016  . Obesity 06/23/2016  . Abnormality of gait 05/25/2016  . Left shoulder pain 05/25/2016  . Pedal edema 05/25/2016  . Osteoporosis 10/14/2014  . Depression 10/14/2014  . Lumbar spondylosis 10/14/2014  . DJD (degenerative joint disease), multiple sites 10/14/2014  . Tremor 10/14/2014  . Macular hole 10/14/2014    Past Medical History: Past Medical History:  Diagnosis Date  . Arthritis   . Cancer ([place])   . Colon cancer ([place])   . Depression   . Heart murmur   . Hyperlipidemia   . Movement disorder   . Neuropathy ([place])   . Osteoporosis   . Vision abnormalities     Past Surgical History: Past Surgical History:  Procedure Laterality Date  . CATARACT EXTRACTION, BILATERAL Bilateral 2015  . CHOLECYSTECTOMY    . COLON RESECTION      Social History: Social History  Substance Use Topics  . Smoking status: Never Smoker  . Smokeless tobacco: Never Used  . Alcohol use No     Comment: occasional   Additional social history: Lives at home with her husband and full time caretakers.  Please also refer to relevant sections of EMR.  Family History: Family History  Problem Relation Age of Onset  . Congestive Heart Failure Mother     Deceased at 108 yo  . CAD Father   . Heart attack Father 66    Deceased  . Atrial fibrillation Brother     Allergies and Medications: Allergies  Allergen Reactions  . Flu Virus Vaccine Swelling  . Penicillins Rash   No current facility-administered medications on file prior to encounter.    Current Outpatient Prescriptions on File Prior to Encounter  Medication Sig Dispense Refill  . acetaminophen (TYLENOL) 500 MG tablet Take 1,000 mg by mouth every 8 (eight) hours as needed for moderate pain (takes 2 tabs in am and can tak more as needed for pain).     Marland Kitchen aspirin 81 MG tablet Take 81 mg by mouth daily.    . cholecalciferol (VITAMIN D) 1000 UNITS tablet Take 1,000 Units by mouth daily.    . hydrocortisone  2.5 % ointment Apply topically 2 (two) times daily. (Patient taking differently: Apply 1 application topically daily as needed (irritation). ) 30 g 5  . sertraline (ZOLOFT) 25 MG tablet Take 1 tablet (25 mg total) by mouth daily. May increase to two tabs after one week (Patient taking differently: Take 50 mg by mouth daily. May increase to two tabs after one week) 60 tablet 1  . triamcinolone (KENALOG) 0.025 % cream Apply 1 application topically 2 (two) times daily. (Patient taking differently: Apply 1 application topically daily as needed (irritation). ) 30 g 6  . vitamin B-12 (CYANOCOBALAMIN) 1000 MCG tablet Take 1,000 mcg by mouth daily.      Objective: BP 138/57   Pulse 86   Temp 98.1 F (36.7 C) (Oral)   Resp 18   LMP 10/14/1988   SpO2 99%  Exam: General: Elderly female resting comfortably in bed in NAD. Eyes: EOMI ENTM: Moist mucous membranes. Cardiovascular: RRR, no murmurs, no edema Respiratory: CTAB, no wheezing or increased WOB; relatively shallow controlled breaths Gastrointestinal: soft, nontender, nondistended, +  bowel sounds MSK: pain to palpation over R groin and R lateral hip, no ecchymosis present, bruise over L second toe. Limb length discrepancy of approximately 1 inch (R shorter than left). TTP of left-sided medial malleolus. Evidence of advanced OA changes throughout feet bilaterally. Derm: No rashes on visualized skin.  Neuro: AOx4, unable to move R leg due to pain. CN II-XII grossly intact.  Psych: mood and affect appropriate  Labs and Imaging: CBC BMET   Recent Labs Lab 10/11/16 2042  WBC 11.7*  HGB 12.2  HCT 38.6  PLT 263    Recent Labs Lab 10/11/16 2042  NA 136  K 4.3  CL 102  CO2 21*  BUN 21*  CREATININE 0.84  GLUCOSE 105*  CALCIUM 9.5     Dg Chest 1 View  Result Date: 10/11/2016 CLINICAL DATA:  Fall. EXAM: CHEST 1 VIEW COMPARISON:  Radiographs of May 12, 2015. FINDINGS: Stable cardiomegaly. No pneumothorax or pleural effusion is noted.  Moderate hiatal hernia is noted. No acute pulmonary disease is noted. Bony thorax is unremarkable. IMPRESSION: Moderate hiatal hernia.  No acute cardiopulmonary abnormality seen. Electronically Signed   By: Marijo Conception, M.D.   On: 10/11/2016 21:41   Dg Knee Complete 4 Views Left  Result Date: 10/11/2016 CLINICAL DATA:  Left knee pain after fall in bathroom today. EXAM: LEFT KNEE - COMPLETE 4+ VIEW COMPARISON:  None. FINDINGS: No evidence of fracture, dislocation, or joint effusion. Moderate tricompartmental osteoarthritis with peripheral spurring and narrowing of the patellofemoral and medial tibial femoral compartments. No focal soft tissue edema. Phleboliths noted in the medial soft tissues. IMPRESSION: Tricompartmental osteoarthritis without acute fracture or subluxation of the left knee. Electronically Signed   By: Jeb Levering M.D.   On: 10/11/2016 19:15   Dg Knee Complete 4 Views Right  Result Date: 10/11/2016 CLINICAL DATA:  Right knee pain after fall in bathroom today. EXAM: RIGHT KNEE - COMPLETE 4+ VIEW COMPARISON:  None. FINDINGS: No evidence of fracture, dislocation, or joint effusion. Moderate tricompartmental osteoarthritis with peripheral spurring, narrowing of the patellofemoral and medial tibial femoral compartments. Faint chondrocalcinosis. Soft tissues are unremarkable. IMPRESSION: Tricompartmental osteoarthritis without acute fracture or subluxation of the right knee. Electronically Signed   By: Jeb Levering M.D.   On: 10/11/2016 19:16   Dg Foot Complete Left  Result Date: 10/11/2016 CLINICAL DATA:  Pain after fall. EXAM: LEFT FOOT - COMPLETE 3+ VIEW COMPARISON:  None. FINDINGS: Degenerative changes seen involving the carpometacarpal joints. No acute fracture or dislocation is noted. Mild spurring of posterior calcaneus is noted. No soft tissue abnormality seen. IMPRESSION: Degenerative changes as described above. No acute abnormality seen in the left foot. Electronically  Signed   By: Marijo Conception, M.D.   On: 10/11/2016 21:39   Dg Foot Complete Right  Result Date: 10/11/2016 CLINICAL DATA:  Fall in the bathroom this morning, now with right toe pain. EXAM: RIGHT FOOT COMPLETE - 3+ VIEW COMPARISON:  None. FINDINGS: The fourth and fifth digits are partially obscured due to hammertoe deformities, allowing for this, no evidence of displaced fracture. No dislocation. The bones are under mineralized. Mild degenerative change of the metatarsal phalangeal joints. There is a plantar calcaneal spur. Soft tissues are unremarkable. IMPRESSION: No evidence of fracture or dislocation of the right foot. Electronically Signed   By: Jeb Levering M.D.   On: 10/11/2016 19:13   Dg Hip Unilat  With Pelvis 2-3 Views Right  Result Date: 10/11/2016 CLINICAL DATA:  82 y/o  F; status post fall with right hip pain. EXAM: DG HIP (WITH OR WITHOUT PELVIS) 2-3V RIGHT COMPARISON:  None. FINDINGS: Impacted right-sided femoral neck fracture. No other fracture identified. Mild degenerative changes of bilateral sacroiliac joints. Lower lumbar arthrosis. Surgical sutures project over the mid pelvis. IMPRESSION: Impacted right-sided femoral neck fracture. Electronically Signed   By: Kristine Garbe M.D.   On: 10/11/2016 19:17   Sela Hilding, MD 10/11/2016, 10:09 PM PGY-1, [place] Intern pager: [phone], text pages welcome   Upper Level Addendum:  I have seen and evaluated this patient along with Dr. Lindell Noe and reviewed the above note, making necessary revisions in red.   Elberta Leatherwood, MD,MS,  PGY3 10/12/2016 1:26 AM

## 2016-10-11 NOTE — ED Provider Notes (Signed)
[facility] DEPT Provider Note   CSN: [id number] Arrival date & time: 10/11/16  1704     History   Chief Complaint Chief Complaint  Patient presents with  . Fall    HPI Helen Odom is a 82 y.o. female With a past medical history significant for osteoporosis, depression, Hyperlipidemia, and prior cancer who presents with a mechanical fall and subsequent pain. Patient reports that while walking to the bathroom at 4:20 AM this morning, she had a mechanical fall. She was using her walker and the transition from carpet to tile culture to fall. She landed on to her knees and right hip. She reports severe onset of pain and inability to bear weight. She denies prior hip fracture. She denies any preceding symptoms such as headache, vision changes, nausea, vomiting, chest pain, palpitations, shortness of breath, abdominal pain. She reports pain in both knees and her feet. She does have a bruise on her left second toe. She is reporting severe pain in the right hip area with any movement or palpation.  She last ate lunch at around noon.   The history is provided by the patient and medical records. No language interpreter was used.  Fall  This is a new problem. The current episode started 12 to 24 hours ago. The problem occurs constantly. The problem has not changed since onset.Pertinent negatives include no chest pain, no abdominal pain, no headaches and no shortness of breath. The symptoms are aggravated by walking. Nothing relieves the symptoms. She has tried nothing for the symptoms.  Hip Pain  This is a new problem. The problem has not changed since onset.Pertinent negatives include no chest pain, no abdominal pain, no headaches and no shortness of breath. Nothing relieves the symptoms.    Past Medical History:  Diagnosis Date  . Arthritis   . Cancer ([place])   . Colon cancer ([place])   . Depression   . Heart murmur   . Hyperlipidemia   . Movement disorder   . Neuropathy ([place])   .  Osteoporosis   . Vision abnormalities     Patient Active Problem List   Diagnosis Date Noted  . Obesity 06/23/2016  . Abnormality of gait 05/25/2016  . Left shoulder pain 05/25/2016  . Pedal edema 05/25/2016  . Osteoporosis 10/14/2014  . Depression 10/14/2014  . Lumbar spondylosis 10/14/2014  . DJD (degenerative joint disease), multiple sites 10/14/2014  . Tremor 10/14/2014  . Macular hole 10/14/2014    Past Surgical History:  Procedure Laterality Date  . CATARACT EXTRACTION, BILATERAL Bilateral 2015  . CHOLECYSTECTOMY    . COLON RESECTION      OB History    No data available       Home Medications    Prior to Admission medications   Medication Sig Start Date End Date Taking? Authorizing Provider  acetaminophen (TYLENOL) 500 MG tablet Take 1,000 mg by mouth every 8 (eight) hours as needed.     Historical Provider, MD  aspirin 81 MG tablet Take 81 mg by mouth daily.    Historical Provider, MD  cholecalciferol (VITAMIN D) 1000 UNITS tablet Take 1,000 Units by mouth daily.    Historical Provider, MD  hydrocortisone 2.5 % ointment Apply topically 2 (two) times daily. 06/22/16   Lind Covert, MD  Multiple Vitamin (MULTIVITAMIN) tablet Take 1 tablet by mouth daily.    Historical Provider, MD  sertraline (ZOLOFT) 25 MG tablet Take 1 tablet (25 mg total) by mouth daily. May increase to two tabs  after one week 09/14/16   Lind Covert, MD  triamcinolone (KENALOG) 0.025 % cream Apply 1 application topically 2 (two) times daily. 09/20/16   Lind Covert, MD  vitamin B-12 (CYANOCOBALAMIN) 1000 MCG tablet Take 1,000 mcg by mouth daily.    Historical Provider, MD    Family History Family History  Problem Relation Age of Onset  . Congestive Heart Failure Mother     Deceased at 46 yo  . CAD Father   . Heart attack Father 62    Deceased  . Atrial fibrillation Brother     Social History Social History  Substance Use Topics  . Smoking status: Never Smoker    . Smokeless tobacco: Never Used  . Alcohol use No     Comment: occasional     Allergies   Flu virus vaccine and Penicillins   Review of Systems Review of Systems  Constitutional: Negative for activity change, chills, diaphoresis, fatigue and fever.  HENT: Negative for congestion and rhinorrhea.   Eyes: Negative for visual disturbance.  Respiratory: Negative for cough, chest tightness, shortness of breath and stridor.   Cardiovascular: Negative for chest pain, palpitations and leg swelling.  Gastrointestinal: Negative for abdominal distention, abdominal pain, constipation, diarrhea, nausea and vomiting.  Genitourinary: Negative for difficulty urinating, dysuria, flank pain, frequency, hematuria, menstrual problem, pelvic pain, vaginal bleeding and vaginal discharge.  Musculoskeletal: Negative for back pain, neck pain and neck stiffness.  Skin: Negative for rash and wound.  Neurological: Negative for dizziness, weakness, light-headedness, numbness and headaches.  Psychiatric/Behavioral: Negative for agitation and confusion.  All other systems reviewed and are negative.    Physical Exam Updated Vital Signs BP 156/70   Pulse 86   Temp 98.1 F (36.7 C) (Oral)   Resp 18   LMP 10/14/1988   SpO2 94%   Physical Exam  Constitutional: She is oriented to person, place, and time. She appears well-developed and well-nourished. No distress.  HENT:  Head: Normocephalic and atraumatic.  Eyes: Conjunctivae are normal.    Neck: Neck supple.  Cardiovascular: Normal rate and regular rhythm.   Murmur heard. Pulmonary/Chest: Effort normal and breath sounds normal. No respiratory distress. She exhibits no tenderness.  Abdominal: Soft. There is no tenderness.  Musculoskeletal: She exhibits tenderness. She exhibits no edema.       Right hip: She exhibits decreased range of motion and tenderness. She exhibits normal strength and no laceration.       Right knee: Tenderness found.       Left  knee: Tenderness found.       Legs:      Left foot: There is tenderness.  Neurological: She is alert and oriented to person, place, and time. She displays normal reflexes. No cranial nerve deficit or sensory deficit. She exhibits normal muscle tone. Coordination normal.  Skin: Skin is warm and dry. No rash noted. No erythema.  Psychiatric: She has a normal mood and affect.  Nursing note and vitals reviewed.    ED Treatments / Results  Labs (all labs ordered are listed, but only abnormal results are displayed) Labs Reviewed  BASIC METABOLIC PANEL - Abnormal; Notable for the following:       Result Value   CO2 21 (*)    Glucose, Bld 105 (*)    BUN 21 (*)    All other components within normal limits  CBC WITH DIFFERENTIAL/PLATELET - Abnormal; Notable for the following:    WBC 11.7 (*)    Neutro Abs 9.6 (*)  All other components within normal limits  PROTIME-INR  CBC  BASIC METABOLIC PANEL  TYPE AND SCREEN  ABO/RH    EKG  EKG Interpretation None       Radiology Dg Chest 1 View  Result Date: 10/11/2016 CLINICAL DATA:  Fall. EXAM: CHEST 1 VIEW COMPARISON:  Radiographs of May 12, 2015. FINDINGS: Stable cardiomegaly. No pneumothorax or pleural effusion is noted. Moderate hiatal hernia is noted. No acute pulmonary disease is noted. Bony thorax is unremarkable. IMPRESSION: Moderate hiatal hernia.  No acute cardiopulmonary abnormality seen. Electronically Signed   By: Marijo Conception, M.D.   On: 10/11/2016 21:41   Dg Knee Complete 4 Views Left  Result Date: 10/11/2016 CLINICAL DATA:  Left knee pain after fall in bathroom today. EXAM: LEFT KNEE - COMPLETE 4+ VIEW COMPARISON:  None. FINDINGS: No evidence of fracture, dislocation, or joint effusion. Moderate tricompartmental osteoarthritis with peripheral spurring and narrowing of the patellofemoral and medial tibial femoral compartments. No focal soft tissue edema. Phleboliths noted in the medial soft tissues. IMPRESSION:  Tricompartmental osteoarthritis without acute fracture or subluxation of the left knee. Electronically Signed   By: Jeb Levering M.D.   On: 10/11/2016 19:15   Dg Knee Complete 4 Views Right  Result Date: 10/11/2016 CLINICAL DATA:  Right knee pain after fall in bathroom today. EXAM: RIGHT KNEE - COMPLETE 4+ VIEW COMPARISON:  None. FINDINGS: No evidence of fracture, dislocation, or joint effusion. Moderate tricompartmental osteoarthritis with peripheral spurring, narrowing of the patellofemoral and medial tibial femoral compartments. Faint chondrocalcinosis. Soft tissues are unremarkable. IMPRESSION: Tricompartmental osteoarthritis without acute fracture or subluxation of the right knee. Electronically Signed   By: Jeb Levering M.D.   On: 10/11/2016 19:16   Dg Foot Complete Left  Result Date: 10/11/2016 CLINICAL DATA:  Pain after fall. EXAM: LEFT FOOT - COMPLETE 3+ VIEW COMPARISON:  None. FINDINGS: Degenerative changes seen involving the carpometacarpal joints. No acute fracture or dislocation is noted. Mild spurring of posterior calcaneus is noted. No soft tissue abnormality seen. IMPRESSION: Degenerative changes as described above. No acute abnormality seen in the left foot. Electronically Signed   By: Marijo Conception, M.D.   On: 10/11/2016 21:39   Dg Foot Complete Right  Result Date: 10/11/2016 CLINICAL DATA:  Fall in the bathroom this morning, now with right toe pain. EXAM: RIGHT FOOT COMPLETE - 3+ VIEW COMPARISON:  None. FINDINGS: The fourth and fifth digits are partially obscured due to hammertoe deformities, allowing for this, no evidence of displaced fracture. No dislocation. The bones are under mineralized. Mild degenerative change of the metatarsal phalangeal joints. There is a plantar calcaneal spur. Soft tissues are unremarkable. IMPRESSION: No evidence of fracture or dislocation of the right foot. Electronically Signed   By: Jeb Levering M.D.   On: 10/11/2016 19:13   Dg Hip  Unilat  With Pelvis 2-3 Views Right  Result Date: 10/11/2016 CLINICAL DATA:  82 y/o  F; status post fall with right hip pain. EXAM: DG HIP (WITH OR WITHOUT PELVIS) 2-3V RIGHT COMPARISON:  None. FINDINGS: Impacted right-sided femoral neck fracture. No other fracture identified. Mild degenerative changes of bilateral sacroiliac joints. Lower lumbar arthrosis. Surgical sutures project over the mid pelvis. IMPRESSION: Impacted right-sided femoral neck fracture. Electronically Signed   By: Kristine Garbe M.D.   On: 10/11/2016 19:17    Procedures Procedures (including critical care time)  Medications Ordered in ED Medications  0.9 %  sodium chloride infusion ( Intravenous New Bag/Given 10/11/16 2049)  sertraline (  ZOLOFT) tablet 50 mg (not administered)  morphine 4 MG/ML injection 1-2 mg (2 mg Intravenous Given 10/12/16 0158)  senna-docusate (Senokot-S) tablet 1 tablet (not administered)  heparin injection 5,000 Units (5,000 Units Subcutaneous Given 10/12/16 0118)  cholecalciferol (VITAMIN D) tablet 1,000 Units (not administered)  vitamin B-12 (CYANOCOBALAMIN) tablet 1,000 mcg (not administered)  fentaNYL (SUBLIMAZE) injection 25 mcg (25 mcg Intravenous Given 10/11/16 2029)  fentaNYL (SUBLIMAZE) injection 25 mcg (25 mcg Intravenous Given 10/11/16 2333)     Initial Impression / Assessment and Plan / ED Course  I have reviewed the triage vital signs and the nursing notes.  Pertinent labs & imaging results that were available during my care of the patient were reviewed by me and considered in my medical decision making (see chart for details).  Clinical Course     Helen Odom is a 82 y.o. female With a past medical history significant for osteoporosis, depression, Hyperlipidemia, and prior cancer who presents with a mechanical fall and subsequent pain.  History and examination above. Exam significant for right hip tenderness, bilateral knee tenderness, and bilateral feet  pain.  Imaging only significant for right hip fracture. Hip fracture order set ordered. Orthopedics called and requested patient be admitted to hospitalist service, made in PO at midnight, and they will see the patient in the morning to discuss possible surgery.  Patient agreed plan and was admitted in stable condition.  Final Clinical Impressions(s) / ED Diagnoses   Final diagnoses:  Closed fracture of right hip, initial encounter ([place])  Fall in home, initial encounter    Clinical Impression: 1. Closed fracture of right hip, initial encounter ([place])   2. Fall in home, initial encounter     Disposition: Admit to Hospitalist service with orthopedics following    Courtney Paris, MD 10/12/16 [phone]

## 2016-10-11 NOTE — ED Triage Notes (Signed)
Pt states "about 420am this morning I used a walker to walk to the bathroom, my knee gave way and I just went down. I fell on my right knee and my right hip" Denies LOC, denies hitting head. Pt in NAD.

## 2016-10-11 NOTE — ED Notes (Signed)
MD at bedside. 

## 2016-10-12 ENCOUNTER — Inpatient Hospital Stay (HOSPITAL_COMMUNITY): Payer: Medicare Other

## 2016-10-12 DIAGNOSIS — Y92099 Unspecified place in other non-institutional residence as the place of occurrence of the external cause: Secondary | ICD-10-CM

## 2016-10-12 DIAGNOSIS — Y92009 Unspecified place in unspecified non-institutional (private) residence as the place of occurrence of the external cause: Secondary | ICD-10-CM

## 2016-10-12 DIAGNOSIS — S72001A Fracture of unspecified part of neck of right femur, initial encounter for closed fracture: Principal | ICD-10-CM

## 2016-10-12 DIAGNOSIS — W19XXXA Unspecified fall, initial encounter: Secondary | ICD-10-CM

## 2016-10-12 LAB — CBC
HEMATOCRIT: 32.2 % — AB (ref 36.0–46.0)
HEMATOCRIT: 34.1 % — AB (ref 36.0–46.0)
HEMOGLOBIN: 10.8 g/dL — AB (ref 12.0–15.0)
Hemoglobin: 10.1 g/dL — ABNORMAL LOW (ref 12.0–15.0)
MCH: 28.8 pg (ref 26.0–34.0)
MCH: 29.2 pg (ref 26.0–34.0)
MCHC: 31.4 g/dL (ref 30.0–36.0)
MCHC: 31.7 g/dL (ref 30.0–36.0)
MCV: 91.7 fL (ref 78.0–100.0)
MCV: 92.2 fL (ref 78.0–100.0)
Platelets: 199 10*3/uL (ref 150–400)
Platelets: 206 10*3/uL (ref 150–400)
RBC: 3.51 MIL/uL — ABNORMAL LOW (ref 3.87–5.11)
RBC: 3.7 MIL/uL — ABNORMAL LOW (ref 3.87–5.11)
RDW: 14.8 % (ref 11.5–15.5)
RDW: 14.6 % (ref 11.5–15.5)
WBC: 8.2 10*3/uL (ref 4.0–10.5)
WBC: 10.4 10*3/uL (ref 4.0–10.5)

## 2016-10-12 LAB — BASIC METABOLIC PANEL
Anion gap: 9 (ref 5–15)
BUN: 16 mg/dL (ref 6–20)
CALCIUM: 8.6 mg/dL — AB (ref 8.9–10.3)
CO2: 24 mmol/L (ref 22–32)
CREATININE: 0.63 mg/dL (ref 0.44–1.00)
Chloride: 104 mmol/L (ref 101–111)
GFR calc non Af Amer: >60 mL/min (ref >60)
Glucose, Bld: 109 mg/dL — ABNORMAL HIGH (ref 65–99)
Potassium: 3.7 mmol/L (ref 3.5–5.1)
SODIUM: 137 mmol/L (ref 135–145)

## 2016-10-12 LAB — SURGICAL PCR SCREEN
MRSA, PCR: NEGATIVE
STAPHYLOCOCCUS AUREUS: NEGATIVE

## 2016-10-12 LAB — TROPONIN I: Troponin I: <0.03 ng/mL (ref <0.03)

## 2016-10-12 MED ORDER — DOCUSATE SODIUM 100 MG PO CAPS
100.0000 mg | ORAL_CAPSULE | Freq: Two times a day (BID) | ORAL | Status: DC
  Administered 2016-10-12 – 2016-10-17 (×9): 100 mg via ORAL
  Filled 2016-10-12 (×9): qty 1

## 2016-10-12 MED ORDER — VITAMIN B-12 1000 MCG PO TABS
1000.0000 ug | ORAL_TABLET | Freq: Every day | ORAL | Status: DC
  Administered 2016-10-12 – 2016-10-17 (×5): 1000 ug via ORAL
  Filled 2016-10-12 (×5): qty 1

## 2016-10-12 MED ORDER — SERTRALINE HCL 50 MG PO TABS
50.0000 mg | ORAL_TABLET | Freq: Every day | ORAL | Status: DC
  Administered 2016-10-12 – 2016-10-17 (×5): 50 mg via ORAL
  Filled 2016-10-12 (×5): qty 1

## 2016-10-12 MED ORDER — SENNOSIDES-DOCUSATE SODIUM 8.6-50 MG PO TABS
1.0000 | ORAL_TABLET | Freq: Every evening | ORAL | Status: DC | PRN
  Filled 2016-10-12: qty 1

## 2016-10-12 MED ORDER — MORPHINE SULFATE (PF) 4 MG/ML IV SOLN
1.0000 mg | INTRAVENOUS | Status: DC | PRN
  Administered 2016-10-12 – 2016-10-15 (×11): 2 mg via INTRAVENOUS
  Filled 2016-10-12 (×11): qty 1

## 2016-10-12 MED ORDER — HEPARIN SODIUM (PORCINE) 5000 UNIT/ML IJ SOLN
5000.0000 [IU] | Freq: Three times a day (TID) | INTRAMUSCULAR | Status: DC
  Administered 2016-10-12 (×2): 5000 [IU] via SUBCUTANEOUS
  Filled 2016-10-12 (×2): qty 1

## 2016-10-12 MED ORDER — VITAMIN D 1000 UNITS PO TABS
1000.0000 [IU] | ORAL_TABLET | Freq: Every day | ORAL | Status: DC
  Administered 2016-10-12 – 2016-10-17 (×5): 1000 [IU] via ORAL
  Filled 2016-10-12 (×5): qty 1

## 2016-10-12 NOTE — Progress Notes (Signed)
Pt arrived to unit at 0450. Fully alert and oriented, caregiver at the bedside. VS stable pt in no acute distress. Pt advised about valuable policy, oriented to room and instructed to use call light.  Admission paged.

## 2016-10-12 NOTE — Progress Notes (Signed)
PCP Visit Appreciate great care of inpt service  Awaiting surgical decision  I added scheduled colace in addition to as needed senokot  She has a Parkisons like movement disorder may make rehab more difficult.  Was to see neurology next week for further evaluation.  If after surgery her parkinson's like disease seems to be impeding progress consider neurology consult.  Her mobility was very limited prior to fracture (had to have assistance to stand) but could walk to BR.  Will need significant rehab if she wants to regain or improve this.  Thanks  [employer]

## 2016-10-12 NOTE — Progress Notes (Signed)
Initial Nutrition Assessment  DOCUMENTATION CODES:   Obesity unspecified  INTERVENTION:   -Continue with regular diet -Will follow for nutritional adequacy and supplement diet as appropriate  NUTRITION DIAGNOSIS:   Increased nutrient needs related to  (post-op healing) as evidenced by estimated needs.  GOAL:   Patient will meet greater than or equal to 90% of their needs  MONITOR:   PO intake, Supplement acceptance, Labs, Weight trends, Skin, I & O's  REASON FOR ASSESSMENT:   Consult Hip fracture protocol  ASSESSMENT:   Helen Odom is a 82 y.o. female presenting with R impacted femoral neck fracture after mechanical fall. PMH is significant for osteoporosis (didn't tolerate bisphosphonates or fosamax - GI upset), depression (currently on zoloft), gait abnormality (hx of eval for parkinson's without diagosis), hx of colon cancer.  Pt admitted with rt femoral neck fx. Per RN, plan for surgery tomorrow.   Pt in with other providers at times of visits. Per RN and caregiver, pt is very hungry and complains of dry mouth. Diet was advanced to regular for lunch due to plan for surgery tomorrow.   Reviewed wt hx; wt has been stable for > 1 year.   Unable to perform nutrition-focused physical exam at this time, however, do not suspect malnutrition at this time. RD will follow-up for nutritional adequacy after surgery.   Medications reviewed and include vitamin B012, vitamin D, and senna.   Labs reviewed.   Diet Order:  Diet regular Room service appropriate? Yes; Fluid consistency: Thin Diet NPO time specified  Skin:  Reviewed, no issues  Last BM:  10/11/16  Height:   Ht Readings from Last 1 Encounters:  10/12/16 5' (1.524 m)    Weight:   Wt Readings from Last 1 Encounters:  10/12/16 169 lb (76.7 kg)    Ideal Body Weight:  45.5 kg  BMI:  Body mass index is 33.01 kg/m.  Estimated Nutritional Needs:   Kcal:  1200-1400  Protein:  55-70 grams  Fluid:   1.2-1.4 L  EDUCATION NEEDS:   No education needs identified at this time  Gadiel John A. Jimmye Norman, RD, LDN, CDE Pager: [phone] After hours Pager: [phone]

## 2016-10-12 NOTE — Consult Note (Signed)
ORTHOPAEDIC CONSULTATION  REQUESTING PHYSICIAN: Dickie La, MD  Chief Complaint: Right hip pain  Assessment: Active Problems:   Hip fracture [facility])   Plan: I discussed surgical and non-surgical options. Images reviewed by Dr. Alain Marion. Cannulated hip pinning cannulated recommended.   Dr. Alain Marion will discuss with hospital team regarding timing of same. She may need medical optimization/cardiac evaluation.  Nothing by mouth for now.  Okay for diet and nothing by mouth after midnight tonight should surgery be planned for tomorrow 10/13/16. Weight Bearing Status: Nonweightbearing. Will update this postoperatively. VTE px: SCD's and Heparin.  The details of surgical options including risks benefits and alternatives of the procedure were discussed with the patient.  The patient verbalizes understanding and wishes to proceed.    HPI: Helen Odom is a 82 y.o. female who complains of  right hip pain after fall onto her right hip yesterday. She denies hitting head, loss of consciousness, dizziness. Before her fall, she was able to ambulate some with the interval walker, but with significant assistance from nursing aide especially when standing. She and her husband have 24-hour assistance at home. Her husband has dementia. She denies shortness of breath, PND, chest pain.    XR shows Impacted right-sided femoral neck fracture.  Orthopedics was consulted for evaluation.    Past Medical History:  Diagnosis Date  . Arthritis   . Cancer ([place])   . Colon cancer ([place])   . Depression   . Heart murmur   . Hyperlipidemia   . Movement disorder   . Neuropathy ([place])   . Osteoporosis   . Vision abnormalities    Past Surgical History:  Procedure Laterality Date  . CATARACT EXTRACTION, BILATERAL Bilateral 2015  . CHOLECYSTECTOMY    . COLON RESECTION     Social History   Social History  . Marital status: Married    Spouse name: N/A  . Number of children: N/A  . Years of education:  N/A   Social History Main Topics  . Smoking status: Never Smoker  . Smokeless tobacco: Never Used  . Alcohol use No     Comment: occasional  . Drug use: No  . Sexual activity: No   Other Topics Concern  . None   Social History Narrative   Lives with her husband who has moderate Alz.   They are in an apartment with 24 hour assistance.   Her niece [name] is her most local relation.  She is in IT at [employer].   Family History  Problem Relation Age of Onset  . Congestive Heart Failure Mother     Deceased at 5 yo  . CAD Father   . Heart attack Father 56    Deceased  . Atrial fibrillation Brother    Allergies  Allergen Reactions  . Flu Virus Vaccine Swelling  . Penicillins Rash   Prior to Admission medications   Medication Sig Start Date End Date Taking? Authorizing Provider  acetaminophen (TYLENOL) 500 MG tablet Take 1,000 mg by mouth every 8 (eight) hours as needed for moderate pain (takes 2 tabs in am and can tak more as needed for pain).    Yes Historical Provider, MD  aspirin 81 MG tablet Take 81 mg by mouth daily.   Yes Historical Provider, MD  cholecalciferol (VITAMIN D) 1000 UNITS tablet Take 1,000 Units by mouth daily.   Yes Historical Provider, MD  hydrocortisone 2.5 % ointment Apply topically 2 (two) times daily. Patient taking differently:  Apply 1 application topically daily as needed (irritation).  06/22/16  Yes Lind Covert, MD  ibuprofen (ADVIL,MOTRIN) 200 MG tablet Take 400 mg by mouth every 6 (six) hours as needed.   Yes Historical Provider, MD  sertraline (ZOLOFT) 25 MG tablet Take 1 tablet (25 mg total) by mouth daily. May increase to two tabs after one week Patient taking differently: Take 50 mg by mouth daily. May increase to two tabs after one week 09/14/16  Yes Lind Covert, MD  vitamin B-12 (CYANOCOBALAMIN) 1000 MCG tablet Take 1,000 mcg by mouth daily.   Yes Historical Provider, MD   Dg Chest 1 View  Result Date: 10/11/2016 CLINICAL  DATA:  Fall. EXAM: CHEST 1 VIEW COMPARISON:  Radiographs of May 12, 2015. FINDINGS: Stable cardiomegaly. No pneumothorax or pleural effusion is noted. Moderate hiatal hernia is noted. No acute pulmonary disease is noted. Bony thorax is unremarkable. IMPRESSION: Moderate hiatal hernia.  No acute cardiopulmonary abnormality seen. Electronically Signed   By: Marijo Conception, M.D.   On: 10/11/2016 21:41   Dg Knee Complete 4 Views Left  Result Date: 10/11/2016 CLINICAL DATA:  Left knee pain after fall in bathroom today. EXAM: LEFT KNEE - COMPLETE 4+ VIEW COMPARISON:  None. FINDINGS: No evidence of fracture, dislocation, or joint effusion. Moderate tricompartmental osteoarthritis with peripheral spurring and narrowing of the patellofemoral and medial tibial femoral compartments. No focal soft tissue edema. Phleboliths noted in the medial soft tissues. IMPRESSION: Tricompartmental osteoarthritis without acute fracture or subluxation of the left knee. Electronically Signed   By: Jeb Levering M.D.   On: 10/11/2016 19:15   Dg Knee Complete 4 Views Right  Result Date: 10/11/2016 CLINICAL DATA:  Right knee pain after fall in bathroom today. EXAM: RIGHT KNEE - COMPLETE 4+ VIEW COMPARISON:  None. FINDINGS: No evidence of fracture, dislocation, or joint effusion. Moderate tricompartmental osteoarthritis with peripheral spurring, narrowing of the patellofemoral and medial tibial femoral compartments. Faint chondrocalcinosis. Soft tissues are unremarkable. IMPRESSION: Tricompartmental osteoarthritis without acute fracture or subluxation of the right knee. Electronically Signed   By: Jeb Levering M.D.   On: 10/11/2016 19:16   Dg Foot Complete Left  Result Date: 10/11/2016 CLINICAL DATA:  Pain after fall. EXAM: LEFT FOOT - COMPLETE 3+ VIEW COMPARISON:  None. FINDINGS: Degenerative changes seen involving the carpometacarpal joints. No acute fracture or dislocation is noted. Mild spurring of posterior calcaneus is  noted. No soft tissue abnormality seen. IMPRESSION: Degenerative changes as described above. No acute abnormality seen in the left foot. Electronically Signed   By: Marijo Conception, M.D.   On: 10/11/2016 21:39   Dg Foot Complete Right  Result Date: 10/11/2016 CLINICAL DATA:  Fall in the bathroom this morning, now with right toe pain. EXAM: RIGHT FOOT COMPLETE - 3+ VIEW COMPARISON:  None. FINDINGS: The fourth and fifth digits are partially obscured due to hammertoe deformities, allowing for this, no evidence of displaced fracture. No dislocation. The bones are under mineralized. Mild degenerative change of the metatarsal phalangeal joints. There is a plantar calcaneal spur. Soft tissues are unremarkable. IMPRESSION: No evidence of fracture or dislocation of the right foot. Electronically Signed   By: Jeb Levering M.D.   On: 10/11/2016 19:13   Dg Hip Unilat  With Pelvis 2-3 Views Right  Result Date: 10/11/2016 CLINICAL DATA:  82 y/o  F; status post fall with right hip pain. EXAM: DG HIP (WITH OR WITHOUT PELVIS) 2-3V RIGHT COMPARISON:  None. FINDINGS: Impacted right-sided femoral neck  fracture. No other fracture identified. Mild degenerative changes of bilateral sacroiliac joints. Lower lumbar arthrosis. Surgical sutures project over the mid pelvis. IMPRESSION: Impacted right-sided femoral neck fracture. Electronically Signed   By: Kristine Garbe M.D.   On: 10/11/2016 19:17    Positive ROS: All other systems have been reviewed and were otherwise negative with the exception of those mentioned in the HPI and as above.  Objective: Labs cbc  Recent Labs  10/11/16 2042 10/12/16 0303  WBC 11.7* 8.2  HGB 12.2 10.1*  HCT 38.6 32.2*  PLT 263 199    Labs inflam No results for input(s): CRP in the last 72 hours.  Invalid input(s): ESR  Labs coag  Recent Labs  10/11/16 2042  INR 1.00     Recent Labs  10/11/16 2042 10/12/16 0303  NA 136 137  K 4.3 3.7  CL 102 104  CO2 21*  24  GLUCOSE 105* 109*  BUN 21* 16  CREATININE 0.84 0.63  CALCIUM 9.5 8.6*    Physical Exam: Vitals:   10/12/16 0517 10/12/16 0536  BP: (!) 133/59   Pulse: 81   Resp: 18   Temp: 100 F (37.8 C) 99.5 F (37.5 C)   General: Alert, frail appearing, no acute distress Mental status: Alert and Oriented x3 Neurologic: Speech Clear and organized, no gross focal findings or movement disorder appreciated. Respiratory: No cyanosis, no use of accessory musculature.  Breaths shallow. Cardiovascular: Systolic murmur RUSB GI: Abdomen is protuberant, soft and non-tender, non-distended. Skin: Warm and dry.  No lesions in the area of chief complaint Extremities: Warm and well perfused w/o edema Psychiatric: Patient is competent for consent with normal mood and affect MUSCULOSKELETAL:  Right hip without tenderness laterally.  No Echymosis or lesion.   Pain with range of motion. Distal sensation intact. EHL, FHL, dorsiflexion, plantar flexion intact. Mild bilateral symmetrical nonpitting edema lower extremities. Other extremities are atraumatic with painless ROM and NVI.   Prudencio Burly III PA-C 10/12/2016 8:18 AM

## 2016-10-12 NOTE — Care Management Note (Addendum)
Case Management Note  Patient Details  Name: Helen Odom MRN: [id number] Date of Birth: Aug 31, 1931  Subjective/Objective:        Patient admitted from home with hip fracture after syncopal episode. Lives with husband who has Alz., in apartment with 24 hour support. Niece [name] is her most local relation, she works for IT at [employer].              Action/Plan:  CM will continue to follow for DC planning.  12/11 Anticipate DC to SNF today as facilitated by CSW, likely [facility].   Expected Discharge Date:                  Expected Discharge Plan:  [place]  In-House Referral:     Discharge planning Services  CM Consult  Post Acute Care Choice:    Choice offered to:     DME Arranged:    DME Agency:     HH Arranged:    [place] Agency:     Status of Service:  In process, will continue to follow  If discussed at Long Length of Stay Meetings, dates discussed:    Additional Comments:  Carles Collet, RN 10/12/2016, 8:58 AM

## 2016-10-12 NOTE — Progress Notes (Signed)
Family Medicine Teaching Service Daily Progress Note Intern Pager: [phone]  Patient name: Helen Odom Medical record number: [id number] Date of birth: May 16, 1931 Age: 82 y.o. Gender: female  Primary Care Provider: Lind Covert, MD Consultants: ortho Code Status: partial - DNI  Pt Overview and Major Events to Date:  12/5 - Admitted for hip fracture after mechanical fall 12/6 Ortho evaluated, planning pinning 12/7am  Assessment and Plan: Helen Odom is a 82 y.o. female presenting with R impacted femoral neck fracture after mechanical fall. PMH is significant for osteoporosis (didn't tolerate bisphosphonates or fosamax - GI upset), depression (currently on zoloft), gait abnormality (hx of eval for parkinson's without diagosis), hx of colon cancer.  # R impacted femoral neck fracture: new after mechanical fall. Hgb decrease 12.2>10.1, although likely dilutional. Calculated Revised Cardiac Risk Index at 0.4-0.9% risk of cardiac event surrounding surgery (variance based on possible diagnosis of CHF based on Echo with G2DD although no hx of exacerbations). NSQUIP calculcated 0.1% cardiac risk. Will obtain EKG and trops, but based on these calculations, patient is a low cardiac risk for an intermediate risk surgery.  -ortho consulted, appreciate recs: pinning 12/7, NPO MN, hold heparin in am -NPO after midnight for OR on 12/7 -regular diet today -pain control with morphine 1-2mg  , will reevaluate if needed for additional pain control  -bed rest  -neurovascular checks q4 hr -recheck CBC this afternoon -sennakot PRN for constipation in the setting of opioid use  # Hypoxia: one sat of 88% in ED, added 3L [place]. Patient satting 91% after I removed O2 on my exam. No respiratory history or reason for hypoxia on CXR, likely poor respiratory effort in the setting of pain, but cannot r/o possible suppression of respiratory drive 2/2 opiates. Would need to consider fat embolus in the setting  of fracture should other vitals change. Last echo 05/2015 with G2DD, no formal CHF diagnosis and no history of exacerbations. G2DD could be normal age-related changes. -incentive spirometry -O2 to maintain sats >92 -attempt to use some restraint when providing PRN pain relief due to concern for suppression of respiratory drive and patient's increased risk for hospital associated delirium -2 view CXR  # Depression: worsening per PCP notes.  -continue home zoloft  # Gait abnormality, tremor: bed rest for now until hip repaired. -PT/OT -fall precautions -Bed rest until cleared by ortho  # Osteoporosis: patient unable to tolerate fosamax or bisphosphonates per chart review.   -continue home vitamin D   # Osteoarthritis: Patient reports pain in bilateral knees at baseline, worsened after fall. Takes 2 tylenol at home, reports high pain tolerance. -pain control as above for hip fracture  FEN/GI: NPO after MN Prophylaxis: SCDs and heparin  Disposition: continued inpatient management  Subjective:  Morphine is helping, understands the various types of surgical options. Niece Education officer, community) present and agrees with plans.  Objective: Temp:  [98.1 F (36.7 C)-100 F (37.8 C)] 99.5 F (37.5 C) (12/06 0536) Pulse Rate:  [79-95] 81 (12/06 0517) Resp:  [14-18] 18 (12/06 0517) BP: (117-173)/(56-82) 133/59 (12/06 0517) SpO2:  [88 %-99 %] 97 % (12/06 0517) Physical Exam: General: Elderly female resting comfortably in bed in NAD. Cardiovascular: RRR, no edema Respiratory: CTAB, no wheezing, crackles or increased WOB; relatively shallow controlled breaths Gastrointestinal: soft, nontender, nondistended, + bowel sounds MSK: pain to palpation over R groin and R lateral hip, no ecchymosis present, bruise over L second toe. Limb length discrepancy of approximately 1 inch (R shorter than left).  Derm:  No rashes on visualized skin.  Neuro: AOx4, unable to move R leg due to pain. CN II-XII grossly  intact.  Psych: mood and affect appropriate  Laboratory:  Recent Labs Lab 10/11/16 2042 10/12/16 0303  WBC 11.7* 8.2  HGB 12.2 10.1*  HCT 38.6 32.2*  PLT 263 199    Recent Labs Lab 10/11/16 2042 10/12/16 0303  NA 136 137  K 4.3 3.7  CL 102 104  CO2 21* 24  BUN 21* 16  CREATININE 0.84 0.63  CALCIUM 9.5 8.6*  GLUCOSE 105* 109*    Imaging/Diagnostic Tests: No new imaging since last note.  Sela Hilding, MD 10/12/2016, 11:12 AM PGY-1, [place] Intern pager: [phone], text pages welcome

## 2016-10-12 NOTE — ED Notes (Signed)
Pt assisted with bed pan

## 2016-10-12 NOTE — ED Notes (Signed)
Pt and family made aware of bed assignment 

## 2016-10-13 ENCOUNTER — Inpatient Hospital Stay (HOSPITAL_COMMUNITY): Payer: Medicare Other | Admitting: Anesthesiology

## 2016-10-13 ENCOUNTER — Encounter (HOSPITAL_COMMUNITY): Payer: Self-pay | Admitting: Anesthesiology

## 2016-10-13 ENCOUNTER — Encounter (HOSPITAL_COMMUNITY)
Admission: EM | Disposition: A | Discharge status: Skilled Nursing Facility | Payer: Self-pay | Source: Home / Self Care | Attending: Family Medicine

## 2016-10-13 ENCOUNTER — Inpatient Hospital Stay (HOSPITAL_COMMUNITY): Payer: Medicare Other

## 2016-10-13 DIAGNOSIS — S72001D Fracture of unspecified part of neck of right femur, subsequent encounter for closed fracture with routine healing: Secondary | ICD-10-CM

## 2016-10-13 DIAGNOSIS — R0602 Shortness of breath: Secondary | ICD-10-CM

## 2016-10-13 DIAGNOSIS — R0600 Dyspnea, unspecified: Secondary | ICD-10-CM

## 2016-10-13 DIAGNOSIS — W19XXXD Unspecified fall, subsequent encounter: Secondary | ICD-10-CM

## 2016-10-13 HISTORY — PX: HIP PINNING,CANNULATED: SHX1758

## 2016-10-13 LAB — BASIC METABOLIC PANEL
Anion gap: 12 (ref 5–15)
BUN: 13 mg/dL (ref 6–20)
CO2: 21 mmol/L — ABNORMAL LOW (ref 22–32)
Calcium: 8.8 mg/dL — ABNORMAL LOW (ref 8.9–10.3)
Chloride: 104 mmol/L (ref 101–111)
Creatinine, Ser: 0.69 mg/dL (ref 0.44–1.00)
GFR calc Af Amer: >60 mL/min (ref >60)
Glucose, Bld: 99 mg/dL (ref 65–99)
POTASSIUM: 3.9 mmol/L (ref 3.5–5.1)
SODIUM: 137 mmol/L (ref 135–145)

## 2016-10-13 LAB — CBC
HCT: 33.3 % — ABNORMAL LOW (ref 36.0–46.0)
Hemoglobin: 10.4 g/dL — ABNORMAL LOW (ref 12.0–15.0)
MCH: 28.9 pg (ref 26.0–34.0)
MCHC: 31.2 g/dL (ref 30.0–36.0)
MCV: 92.5 fL (ref 78.0–100.0)
PLATELETS: 209 10*3/uL (ref 150–400)
RBC: 3.6 MIL/uL — AB (ref 3.87–5.11)
RDW: 14.7 % (ref 11.5–15.5)
WBC: 10.3 10*3/uL (ref 4.0–10.5)

## 2016-10-13 SURGERY — FIXATION, FEMUR, NECK, PERCUTANEOUS, USING SCREW
Anesthesia: General | Site: Hip | Laterality: Right

## 2016-10-13 MED ORDER — SODIUM CHLORIDE 0.9 % IV SOLN
INTRAVENOUS | Status: DC
  Administered 2016-10-13: 75 mL/h via INTRAVENOUS
  Administered 2016-10-13: 23:00:00 via INTRAVENOUS

## 2016-10-13 MED ORDER — PROPOFOL 10 MG/ML IV BOLUS
INTRAVENOUS | Status: AC
  Filled 2016-10-13: qty 40

## 2016-10-13 MED ORDER — ENOXAPARIN SODIUM 40 MG/0.4ML ~~LOC~~ SOLN: 40.0000 mg | SUBCUTANEOUS | 0 refills | Status: DC

## 2016-10-13 MED ORDER — PROMETHAZINE HCL 25 MG/ML IJ SOLN: 6.2500 mg | INTRAMUSCULAR | Status: DC | PRN

## 2016-10-13 MED ORDER — PHENYLEPHRINE HCL 10 MG/ML IJ SOLN
INTRAMUSCULAR | Status: DC | PRN
  Administered 2016-10-13: 20 ug/min via INTRAVENOUS

## 2016-10-13 MED ORDER — PHENYLEPHRINE 40 MCG/ML (10ML) SYRINGE FOR IV PUSH (FOR BLOOD PRESSURE SUPPORT)
PREFILLED_SYRINGE | INTRAVENOUS | Status: AC
  Filled 2016-10-13: qty 10

## 2016-10-13 MED ORDER — FENTANYL CITRATE (PF) 100 MCG/2ML IJ SOLN
INTRAMUSCULAR | Status: AC
  Filled 2016-10-13: qty 4

## 2016-10-13 MED ORDER — CHLORHEXIDINE GLUCONATE 4 % EX LIQD
60.0000 mL | Freq: Once | CUTANEOUS | Status: AC
  Administered 2016-10-13: 4 via TOPICAL
  Filled 2016-10-13: qty 60

## 2016-10-13 MED ORDER — IPRATROPIUM-ALBUTEROL 0.5-2.5 (3) MG/3ML IN SOLN: 3.0000 mL | Freq: Four times a day (QID) | RESPIRATORY_TRACT | Status: DC | PRN

## 2016-10-13 MED ORDER — HYDROCODONE-ACETAMINOPHEN 5-325 MG PO TABS
1.0000 | ORAL_TABLET | ORAL | Status: DC | PRN
  Administered 2016-10-13 (×2): 2 via ORAL
  Administered 2016-10-14 – 2016-10-15 (×2): 1 via ORAL
  Administered 2016-10-15: 2 via ORAL
  Administered 2016-10-16: 1 via ORAL
  Administered 2016-10-16 – 2016-10-17 (×3): 2 via ORAL
  Filled 2016-10-13: qty 1
  Filled 2016-10-13 (×4): qty 2
  Filled 2016-10-13: qty 1
  Filled 2016-10-13 (×2): qty 2
  Filled 2016-10-13: qty 1

## 2016-10-13 MED ORDER — ONDANSETRON HCL 4 MG/2ML IJ SOLN: 4.0000 mg | Freq: Once | INTRAMUSCULAR | Status: DC | PRN

## 2016-10-13 MED ORDER — HYDROMORPHONE HCL 1 MG/ML IJ SOLN
INTRAMUSCULAR | Status: AC
  Filled 2016-10-13: qty 0.5

## 2016-10-13 MED ORDER — ACETAMINOPHEN 650 MG RE SUPP: 650.0000 mg | Freq: Four times a day (QID) | RECTAL | Status: DC | PRN

## 2016-10-13 MED ORDER — CEFAZOLIN SODIUM-DEXTROSE 2-4 GM/100ML-% IV SOLN
2.0000 g | INTRAVENOUS | Status: AC
  Administered 2016-10-13: 2 g via INTRAVENOUS
  Filled 2016-10-13: qty 100

## 2016-10-13 MED ORDER — DOCUSATE SODIUM 100 MG PO CAPS: 100.0000 mg | ORAL_CAPSULE | Freq: Two times a day (BID) | ORAL | 0 refills | Status: DC

## 2016-10-13 MED ORDER — HYDROCODONE-ACETAMINOPHEN 5-325 MG PO TABS: 1.0000 | ORAL_TABLET | Freq: Four times a day (QID) | ORAL | 0 refills | Status: DC | PRN

## 2016-10-13 MED ORDER — IPRATROPIUM-ALBUTEROL 0.5-2.5 (3) MG/3ML IN SOLN
3.0000 mL | Freq: Once | RESPIRATORY_TRACT | Status: AC
  Administered 2016-10-13: 3 mL via RESPIRATORY_TRACT
  Filled 2016-10-13: qty 3

## 2016-10-13 MED ORDER — 0.9 % SODIUM CHLORIDE (POUR BTL) OPTIME
TOPICAL | Status: DC | PRN
  Administered 2016-10-13: 1000 mL

## 2016-10-13 MED ORDER — ENOXAPARIN SODIUM 40 MG/0.4ML ~~LOC~~ SOLN
40.0000 mg | SUBCUTANEOUS | Status: DC
  Administered 2016-10-14 – 2016-10-17 (×4): 40 mg via SUBCUTANEOUS
  Filled 2016-10-13 (×4): qty 0.4

## 2016-10-13 MED ORDER — KETOROLAC TROMETHAMINE 15 MG/ML IJ SOLN
7.5000 mg | Freq: Four times a day (QID) | INTRAMUSCULAR | Status: AC
  Administered 2016-10-13 – 2016-10-14 (×3): 7.5 mg via INTRAVENOUS
  Filled 2016-10-13 (×3): qty 1

## 2016-10-13 MED ORDER — CEFAZOLIN IN D5W 1 GM/50ML IV SOLN
1.0000 g | Freq: Four times a day (QID) | INTRAVENOUS | Status: AC
  Administered 2016-10-13 – 2016-10-14 (×2): 1 g via INTRAVENOUS
  Filled 2016-10-13 (×3): qty 50

## 2016-10-13 MED ORDER — HYDROMORPHONE HCL 1 MG/ML IJ SOLN: 0.2500 mg | INTRAMUSCULAR | Status: DC | PRN

## 2016-10-13 MED ORDER — HYDROMORPHONE HCL 1 MG/ML IJ SOLN
0.2500 mg | INTRAMUSCULAR | Status: DC | PRN
  Administered 2016-10-13 (×2): 0.25 mg via INTRAVENOUS

## 2016-10-13 MED ORDER — SENNA 8.6 MG PO TABS
1.0000 | ORAL_TABLET | Freq: Two times a day (BID) | ORAL | Status: DC
  Administered 2016-10-13 – 2016-10-17 (×8): 8.6 mg via ORAL
  Filled 2016-10-13 (×8): qty 1

## 2016-10-13 MED ORDER — MEPERIDINE HCL 25 MG/ML IJ SOLN: 6.2500 mg | INTRAMUSCULAR | Status: DC | PRN

## 2016-10-13 MED ORDER — PROPOFOL 10 MG/ML IV BOLUS
INTRAVENOUS | Status: DC | PRN
  Administered 2016-10-13: 20 mg via INTRAVENOUS
  Administered 2016-10-13: 10 mg via INTRAVENOUS
  Administered 2016-10-13: 20 mg via INTRAVENOUS

## 2016-10-13 MED ORDER — ACETAMINOPHEN 325 MG PO TABS
650.0000 mg | ORAL_TABLET | Freq: Four times a day (QID) | ORAL | Status: DC | PRN
  Administered 2016-10-16: 650 mg via ORAL
  Filled 2016-10-13: qty 2

## 2016-10-13 MED ORDER — PROPOFOL 500 MG/50ML IV EMUL
INTRAVENOUS | Status: DC | PRN
  Administered 2016-10-13: 25 ug/kg/min via INTRAVENOUS

## 2016-10-13 MED ORDER — FENTANYL CITRATE (PF) 100 MCG/2ML IJ SOLN
INTRAMUSCULAR | Status: DC | PRN
  Administered 2016-10-13 (×2): 50 ug via INTRAVENOUS

## 2016-10-13 SURGICAL SUPPLY — 37 items
BIT DRILL 4.9 CANNULATED (BIT) ×1
BIT DRILL CANN QC 4.9 LRG (BIT) ×1 IMPLANT
BNDG COHESIVE 4X5 TAN STRL (GAUZE/BANDAGES/DRESSINGS) ×3 IMPLANT
BNDG GAUZE ELAST 4 BULKY (GAUZE/BANDAGES/DRESSINGS) ×3 IMPLANT
CLOSURE WOUND 1/2 X4 (GAUZE/BANDAGES/DRESSINGS) ×1
COVER PERINEAL POST (MISCELLANEOUS) ×3 IMPLANT
COVER SURGICAL LIGHT HANDLE (MISCELLANEOUS) ×3 IMPLANT
DRAPE STERI IOBAN 125X83 (DRAPES) ×3 IMPLANT
DRILL BIT CANNULATED 4.9 (BIT) ×2
DRSG MEPILEX BORDER 4X4 (GAUZE/BANDAGES/DRESSINGS) ×3 IMPLANT
DURAPREP 26ML APPLICATOR (WOUND CARE) ×3 IMPLANT
ELECT REM PT RETURN 9FT ADLT (ELECTROSURGICAL) ×3
ELECTRODE REM PT RTRN 9FT ADLT (ELECTROSURGICAL) ×1 IMPLANT
GLOVE BIO SURGEON STRL SZ7.5 (GLOVE) ×6 IMPLANT
GLOVE BIOGEL PI IND STRL 8 (GLOVE) ×2 IMPLANT
GLOVE BIOGEL PI INDICATOR 8 (GLOVE) ×4
GOWN STRL REUS W/ TWL LRG LVL3 (GOWN DISPOSABLE) ×3 IMPLANT
GOWN STRL REUS W/TWL LRG LVL3 (GOWN DISPOSABLE) ×6
GUIDEWIRE THRD ASNIS 3.2X300 (WIRE) ×9 IMPLANT
KIT BASIN OR (CUSTOM PROCEDURE TRAY) ×3 IMPLANT
KIT ROOM TURNOVER OR (KITS) ×3 IMPLANT
MANIFOLD NEPTUNE II (INSTRUMENTS) ×3 IMPLANT
NS IRRIG 1000ML POUR BTL (IV SOLUTION) ×3 IMPLANT
PACK GENERAL/GYN (CUSTOM PROCEDURE TRAY) ×3 IMPLANT
PAD ARMBOARD 7.5X6 YLW CONV (MISCELLANEOUS) ×6 IMPLANT
SCREW ASNIS 85MM (Screw) ×3 IMPLANT
SCREW CANN 6.5X80 STRL (Screw) ×6 IMPLANT
STRIP CLOSURE SKIN 1/2X4 (GAUZE/BANDAGES/DRESSINGS) ×2 IMPLANT
SUT MNCRL AB 4-0 PS2 18 (SUTURE) ×3 IMPLANT
SUT MON AB 2-0 CT1 36 (SUTURE) ×3 IMPLANT
SUT VIC AB 0 CT1 27 (SUTURE)
SUT VIC AB 0 CT1 27XBRD ANBCTR (SUTURE) IMPLANT
TOWEL OR 17X24 6PK STRL BLUE (TOWEL DISPOSABLE) ×3 IMPLANT
TOWEL OR 17X26 10 PK STRL BLUE (TOWEL DISPOSABLE) ×3 IMPLANT
WASHER (Washer) ×2 IMPLANT
WASHER ORTH CANN SS SCR (Washer) ×1 IMPLANT
WATER STERILE IRR 1000ML POUR (IV SOLUTION) ×3 IMPLANT

## 2016-10-13 NOTE — Anesthesia Preprocedure Evaluation (Addendum)
Anesthesia Evaluation  Patient identified by MRN, date of birth, ID band Patient awake    Reviewed: Allergy & Precautions, NPO status , Patient's Chart, lab work & pertinent test results  Airway Mallampati: II  TM Distance: >3 FB Neck ROM: Full    Dental no notable dental hx.    Pulmonary neg pulmonary ROS,    Pulmonary exam normal breath sounds clear to auscultation       Cardiovascular negative cardio ROS Normal cardiovascular exam Rhythm:Regular Rate:Normal     Neuro/Psych Depression negative neurological ROS  negative psych ROS   GI/Hepatic negative GI ROS, Neg liver ROS,   Endo/Other  negative endocrine ROS  Renal/GU negative Renal ROS  negative genitourinary   Musculoskeletal negative musculoskeletal ROS (+)   Abdominal   Peds negative pediatric ROS (+)  Hematology negative hematology ROS (+)   Anesthesia Other Findings   Reproductive/Obstetrics negative OB ROS                            Anesthesia Physical Anesthesia Plan  ASA: II  Anesthesia Plan: Spinal and MAC   Post-op Pain Management:    Induction: Intravenous  Airway Management Planned: Simple Face Mask  Additional Equipment:   Intra-op Plan:   Post-operative Plan: Extubation in OR  Informed Consent: I have reviewed the patients History and Physical, chart, labs and discussed the procedure including the risks, benefits and alternatives for the proposed anesthesia with the patient or authorized representative who has indicated his/her understanding and acceptance.   Dental advisory given  Plan Discussed with: CRNA and Surgeon  Anesthesia Plan Comments:        Anesthesia Quick Evaluation

## 2016-10-13 NOTE — Discharge Summary (Signed)
[facility] Discharge Summary  Patient name: KAIYAH DIMATTEO Medical record number: [id number] Date of birth: [birth year] Age: 82 y.o. Gender: female Date of Admission: 10/11/2016  Date of Discharge: 10/17/16  Admitting Physician: Dickie La, MD  Primary Care Provider: Lind Covert, MD Consultants: ortho  Indication for Hospitalization: hip fracture  Discharge Diagnoses/Problem List:  R impacted femoral neck fracture, s/p pinning 12/7 Elevated Blood Pressure Depression Gait abnormality Osteoporosis Osteoarthritis  Disposition: SNF  Discharge Condition: stable  Discharge Exam:  General: NAD. Cardiovascular: RRR, no edema Respiratory: good air movement, crackles in bilateral bases, no wheezing, increased WOB Gastrointestinal: soft, nontender, nondistended, + bowel sounds MSK: Bandage clean and dry over R lateral hip. No evidence of hematoma  Derm: No rashes on visualized skin.  Neuro: AOx4.  Psych: mood and affect appropriate  Brief Hospital Course:  Patient presented to ED after mechanical fall at 4am while going to the bathroom with her walker. Found to have R impacted femoral neck fracture. Also hypoxic to 88% requiring 2L [place]. CXR without concerns for infectious process, patient with shallow but slow breathing and limited mobility. Weaned to room air on day prior to discharge. Continued to encourage IS. Pain well controlled with norco 5-325 approximately BID (ordered q4h PRN) for several days prior to discharge. PT found patient to have very limited mobility, needing 2 person assist for transfers. Few steps taken on day prior to discharge were very slow, although this is reportedly similar to baseline.   Issues for Follow Up:  1. Consider outpatient PFTs, hypoxia without infectious or edematous cause, consider COPD despite being a never smoker.  2. Elevated blood pressure on day of discharge. Significant fall risk. My BP goal for this  patient would be 150/90, would consider adding norvasc if BP continues to be elevated.  3. Lovenox for 30 days post surgery per ortho team, end date 11/14/16.   Significant Procedures: R hip pinning  Significant Labs and Imaging:   Recent Labs Lab 10/13/16 0510 10/15/16 0530 10/16/16 0707  WBC 10.3 7.2 9.1  HGB 10.4* 9.1* 9.9*  HCT 33.3* 28.5* 31.6*  PLT 209 201 232    Recent Labs Lab 10/11/16 2042 10/12/16 0303 10/13/16 0510 10/15/16 0530 10/16/16 0453  NA 136 137 137 139 140  K 4.3 3.7 3.9 3.7 3.9  CL 102 104 104 108 108  CO2 21* 24 21* 22 22  GLUCOSE 105* 109* 99 100* 95  BUN 21* 16 13 16 17   CREATININE 0.84 0.63 0.69 0.51 0.52  CALCIUM 9.5 8.6* 8.8* 8.5* 8.5*    Results/Tests Pending at Time of Discharge: none  Discharge Medications:    Medication List    STOP taking these medications   aspirin 81 MG tablet   hydrocortisone 2.5 % ointment   ibuprofen 200 MG tablet Commonly known as:  ADVIL,MOTRIN     TAKE these medications   acetaminophen 325 MG tablet Commonly known as:  TYLENOL Take 2 tablets (650 mg total) by mouth every 6 (six) hours as needed for mild pain (or Fever >/= 101). What changed:  medication strength  how much to take  when to take this  reasons to take this   cholecalciferol 1000 units tablet Commonly known as:  VITAMIN D Take 1,000 Units by mouth daily.   docusate sodium 100 MG capsule Commonly known as:  COLACE Take 1 capsule (100 mg total) by mouth 2 (two) times daily.   docusate sodium 100 MG capsule Commonly  known as:  COLACE Take 1 capsule (100 mg total) by mouth 2 (two) times daily.   enoxaparin 40 MG/0.4ML injection Commonly known as:  LOVENOX Inject 0.4 mLs (40 mg total) into the skin daily. For 30 days post op for DVT prophylaxis   enoxaparin 40 MG/0.4ML injection Commonly known as:  LOVENOX Inject 0.4 mLs (40 mg total) into the skin daily. Start taking on:  10/18/2016   HYDROcodone-acetaminophen 5-325 MG  tablet Commonly known as:  NORCO Take 1-2 tablets by mouth every 6 (six) hours as needed for moderate pain.   HYDROcodone-acetaminophen 5-325 MG tablet Commonly known as:  NORCO/VICODIN Take 1-2 tablets by mouth every 4 (four) hours as needed (breakthrough pain).   polyethylene glycol packet Commonly known as:  MIRALAX / GLYCOLAX Take 17 g by mouth daily as needed for moderate constipation.   sertraline 25 MG tablet Commonly known as:  ZOLOFT Take 1 tablet (25 mg total) by mouth daily. May increase to two tabs after one week What changed:  how much to take  additional instructions   vitamin B-12 1000 MCG tablet Commonly known as:  CYANOCOBALAMIN Take 1,000 mcg by mouth daily.       Discharge Instructions: Please refer to Patient Instructions section of EMR for full details.  Patient was counseled important signs and symptoms that should prompt return to medical care, changes in medications, dietary instructions, activity restrictions, and follow up appointments.   Follow-Up Appointments: Follow-up Information    MURPHY, TIMOTHY D, MD Follow up in 2 week(s).   Specialty:  Orthopedic Surgery Contact information: [address] [id number]           Sela Hilding, MD 10/17/2016, 12:20 PM PGY-1, [place]

## 2016-10-13 NOTE — Anesthesia Procedure Notes (Signed)
Spinal  Patient location during procedure: OR Start time: 10/13/2016 7:45 AM End time: 10/13/2016 7:55 AM Staffing Anesthesiologist: Lillia Abed Performed: anesthesiologist  Preanesthetic Checklist Completed: patient identified, surgical consent, pre-op evaluation, timeout performed, IV checked, risks and benefits discussed and monitors and equipment checked Spinal Block Patient position: left lateral decubitus Prep: Betadine Patient monitoring: heart rate, cardiac monitor, continuous pulse ox and blood pressure Approach: left paramedian Location: L3-4 Injection technique: single-shot Needle Needle type: Pencan  Needle gauge: 22 G Needle length: 9 cm Needle insertion depth: 7 cm

## 2016-10-13 NOTE — Progress Notes (Signed)
Report given to robin roberts rn as caregiver 

## 2016-10-13 NOTE — H&P (View-Only) (Signed)
ORTHOPAEDIC CONSULTATION  REQUESTING PHYSICIAN: Dickie La, MD  Chief Complaint: Right hip pain  Assessment: Active Problems:   Hip fracture [facility])   Plan: I discussed surgical and non-surgical options. Images reviewed by Dr. Alain Marion. Cannulated hip pinning cannulated recommended.   Dr. Alain Marion will discuss with hospital team regarding timing of same. She may need medical optimization/cardiac evaluation.  Nothing by mouth for now.  Okay for diet and nothing by mouth after midnight tonight should surgery be planned for tomorrow 10/13/16. Weight Bearing Status: Nonweightbearing. Will update this postoperatively. VTE px: SCD's and Heparin.  The details of surgical options including risks benefits and alternatives of the procedure were discussed with the patient.  The patient verbalizes understanding and wishes to proceed.    HPI: Helen Odom is a 82 y.o. female who complains of  right hip pain after fall onto her right hip yesterday. She denies hitting head, loss of consciousness, dizziness. Before her fall, she was able to ambulate some with the interval walker, but with significant assistance from nursing aide especially when standing. She and her husband have 24-hour assistance at home. Her husband has dementia. She denies shortness of breath, PND, chest pain.    XR shows Impacted right-sided femoral neck fracture.  Orthopedics was consulted for evaluation.    Past Medical History:  Diagnosis Date  . Arthritis   . Cancer ([place])   . Colon cancer ([place])   . Depression   . Heart murmur   . Hyperlipidemia   . Movement disorder   . Neuropathy ([place])   . Osteoporosis   . Vision abnormalities    Past Surgical History:  Procedure Laterality Date  . CATARACT EXTRACTION, BILATERAL Bilateral 2015  . CHOLECYSTECTOMY    . COLON RESECTION     Social History   Social History  . Marital status: Married    Spouse name: N/A  . Number of children: N/A  . Years of education:  N/A   Social History Main Topics  . Smoking status: Never Smoker  . Smokeless tobacco: Never Used  . Alcohol use No     Comment: occasional  . Drug use: No  . Sexual activity: No   Other Topics Concern  . None   Social History Narrative   Lives with her husband who has moderate Alz.   They are in an apartment with 24 hour assistance.   Her niece [name] is her most local relation.  She is in IT at [employer].   Family History  Problem Relation Age of Onset  . Congestive Heart Failure Mother     Deceased at 5 yo  . CAD Father   . Heart attack Father 56    Deceased  . Atrial fibrillation Brother    Allergies  Allergen Reactions  . Flu Virus Vaccine Swelling  . Penicillins Rash   Prior to Admission medications   Medication Sig Start Date End Date Taking? Authorizing Provider  acetaminophen (TYLENOL) 500 MG tablet Take 1,000 mg by mouth every 8 (eight) hours as needed for moderate pain (takes 2 tabs in am and can tak more as needed for pain).    Yes Historical Provider, MD  aspirin 81 MG tablet Take 81 mg by mouth daily.   Yes Historical Provider, MD  cholecalciferol (VITAMIN D) 1000 UNITS tablet Take 1,000 Units by mouth daily.   Yes Historical Provider, MD  hydrocortisone 2.5 % ointment Apply topically 2 (two) times daily. Patient taking differently:  Apply 1 application topically daily as needed (irritation).  06/22/16  Yes Carney Living, MD  ibuprofen (ADVIL,MOTRIN) 200 MG tablet Take 400 mg by mouth every 6 (six) hours as needed.   Yes Historical Provider, MD  sertraline (ZOLOFT) 25 MG tablet Take 1 tablet (25 mg total) by mouth daily. May increase to two tabs after one week Patient taking differently: Take 50 mg by mouth daily. May increase to two tabs after one week 09/14/16  Yes Carney Living, MD  vitamin B-12 (CYANOCOBALAMIN) 1000 MCG tablet Take 1,000 mcg by mouth daily.   Yes Historical Provider, MD   Dg Chest 1 View  Result Date: 10/11/2016 CLINICAL  DATA:  Fall. EXAM: CHEST 1 VIEW COMPARISON:  Radiographs of May 12, 2015. FINDINGS: Stable cardiomegaly. No pneumothorax or pleural effusion is noted. Moderate hiatal hernia is noted. No acute pulmonary disease is noted. Bony thorax is unremarkable. IMPRESSION: Moderate hiatal hernia.  No acute cardiopulmonary abnormality seen. Electronically Signed   By: Lupita Raider, M.D.   On: 10/11/2016 21:41   Dg Knee Complete 4 Views Left  Result Date: 10/11/2016 CLINICAL DATA:  Left knee pain after fall in bathroom today. EXAM: LEFT KNEE - COMPLETE 4+ VIEW COMPARISON:  None. FINDINGS: No evidence of fracture, dislocation, or joint effusion. Moderate tricompartmental osteoarthritis with peripheral spurring and narrowing of the patellofemoral and medial tibial femoral compartments. No focal soft tissue edema. Phleboliths noted in the medial soft tissues. IMPRESSION: Tricompartmental osteoarthritis without acute fracture or subluxation of the left knee. Electronically Signed   By: Rubye Oaks M.D.   On: 10/11/2016 19:15   Dg Knee Complete 4 Views Right  Result Date: 10/11/2016 CLINICAL DATA:  Right knee pain after fall in bathroom today. EXAM: RIGHT KNEE - COMPLETE 4+ VIEW COMPARISON:  None. FINDINGS: No evidence of fracture, dislocation, or joint effusion. Moderate tricompartmental osteoarthritis with peripheral spurring, narrowing of the patellofemoral and medial tibial femoral compartments. Faint chondrocalcinosis. Soft tissues are unremarkable. IMPRESSION: Tricompartmental osteoarthritis without acute fracture or subluxation of the right knee. Electronically Signed   By: Rubye Oaks M.D.   On: 10/11/2016 19:16   Dg Foot Complete Left  Result Date: 10/11/2016 CLINICAL DATA:  Pain after fall. EXAM: LEFT FOOT - COMPLETE 3+ VIEW COMPARISON:  None. FINDINGS: Degenerative changes seen involving the carpometacarpal joints. No acute fracture or dislocation is noted. Mild spurring of posterior calcaneus is  noted. No soft tissue abnormality seen. IMPRESSION: Degenerative changes as described above. No acute abnormality seen in the left foot. Electronically Signed   By: Lupita Raider, M.D.   On: 10/11/2016 21:39   Dg Foot Complete Right  Result Date: 10/11/2016 CLINICAL DATA:  Fall in the bathroom this morning, now with right toe pain. EXAM: RIGHT FOOT COMPLETE - 3+ VIEW COMPARISON:  None. FINDINGS: The fourth and fifth digits are partially obscured due to hammertoe deformities, allowing for this, no evidence of displaced fracture. No dislocation. The bones are under mineralized. Mild degenerative change of the metatarsal phalangeal joints. There is a plantar calcaneal spur. Soft tissues are unremarkable. IMPRESSION: No evidence of fracture or dislocation of the right foot. Electronically Signed   By: Rubye Oaks M.D.   On: 10/11/2016 19:13   Dg Hip Unilat  With Pelvis 2-3 Views Right  Result Date: 10/11/2016 CLINICAL DATA:  82 y/o  F; status post fall with right hip pain. EXAM: DG HIP (WITH OR WITHOUT PELVIS) 2-3V RIGHT COMPARISON:  None. FINDINGS: Impacted right-sided femoral neck  fracture. No other fracture identified. Mild degenerative changes of bilateral sacroiliac joints. Lower lumbar arthrosis. Surgical sutures project over the mid pelvis. IMPRESSION: Impacted right-sided femoral neck fracture. Electronically Signed   By: Kristine Garbe M.D.   On: 10/11/2016 19:17    Positive ROS: All other systems have been reviewed and were otherwise negative with the exception of those mentioned in the HPI and as above.  Objective: Labs cbc  Recent Labs  10/11/16 2042 10/12/16 0303  WBC 11.7* 8.2  HGB 12.2 10.1*  HCT 38.6 32.2*  PLT 263 199    Labs inflam No results for input(s): CRP in the last 72 hours.  Invalid input(s): ESR  Labs coag  Recent Labs  10/11/16 2042  INR 1.00     Recent Labs  10/11/16 2042 10/12/16 0303  NA 136 137  K 4.3 3.7  CL 102 104  CO2 21*  24  GLUCOSE 105* 109*  BUN 21* 16  CREATININE 0.84 0.63  CALCIUM 9.5 8.6*    Physical Exam: Vitals:   10/12/16 0517 10/12/16 0536  BP: (!) 133/59   Pulse: 81   Resp: 18   Temp: 100 F (37.8 C) 99.5 F (37.5 C)   General: Alert, frail appearing, no acute distress Mental status: Alert and Oriented x3 Neurologic: Speech Clear and organized, no gross focal findings or movement disorder appreciated. Respiratory: No cyanosis, no use of accessory musculature.  Breaths shallow. Cardiovascular: Systolic murmur RUSB GI: Abdomen is protuberant, soft and non-tender, non-distended. Skin: Warm and dry.  No lesions in the area of chief complaint Extremities: Warm and well perfused w/o edema Psychiatric: Patient is competent for consent with normal mood and affect MUSCULOSKELETAL:  Right hip without tenderness laterally.  No Echymosis or lesion.   Pain with range of motion. Distal sensation intact. EHL, FHL, dorsiflexion, plantar flexion intact. Mild bilateral symmetrical nonpitting edema lower extremities. Other extremities are atraumatic with painless ROM and NVI.   Prudencio Burly III PA-C 10/12/2016 8:18 AM

## 2016-10-13 NOTE — Interval H&P Note (Signed)
History and Physical Interval Note:  10/13/2016 7:47 AM  Helen Odom  has presented today for surgery, with the diagnosis of Right Hip Fracture  The various methods of treatment have been discussed with the patient and family. After consideration of risks, benefits and other options for treatment, the patient has consented to  Procedure(s): RIGHT HIP PINNING (Right) as a surgical intervention .  The patient's history has been reviewed, patient examined, no change in status, stable for surgery.  I have reviewed the patient's chart and labs.  Questions were answered to the patient's satisfaction.     Emiko Osorto D

## 2016-10-13 NOTE — Discharge Instructions (Signed)
Hip Fracture A hip fracture is a fracture of the upper part of your thigh bone (femur). What are the causes? A hip fracture is caused by a direct blow to the side of your hip. This is usually the result of a fall but can occur in other circumstances, such as an automobile accident. What increases the risk? There is an increased risk of hip fractures in people with:  An unsteady walking pattern (gait) and those with conditions that contribute to poor balance, such as Parkinson's disease or dementia.  Osteopenia and osteoporosis.  Cancer that spreads to the leg bones.  Certain metabolic diseases. What are the signs or symptoms? Symptoms of hip fracture include:  Pain over the injured hip.  Inability to put weight on the leg in which the fracture occurred (although, some patients are able to walk after a hip fracture).  Toes and foot of the affected leg point outward when you lie down. How is this diagnosed? A physical exam can determine if a hip fracture is likely to have occurred. X-ray exams are needed to confirm the fracture and to look for other injuries. The X-ray exam can help to determine the type of hip fracture. Rarely, the fracture is not visible on an X-ray image and a CT scan or MRI will have to be done. How is this treated? The treatment for a fracture is usually surgery. This means using a screw, nail, or rod to hold the bones in place. Follow these instructions at home: Take all medicines as directed by your health care provider. Contact a health care provider if: Pain continues, even after taking pain medicine. This information is not intended to replace advice given to you by your health care provider. Make sure you discuss any questions you have with your health care provider. Document Released: 10/24/2005 Document Revised: 03/31/2016 Document Reviewed: 06/05/2013 Elsevier Interactive Patient Education  2017 [employer]  Diet: As you were doing prior to  hospitalization   Shower:   Keep dressing dry. If the bandage gets wet, change with a clean dry gauze.    Dressing:  We will change your bandages during your first follow-up appointment.    Activity:  Increase activity slowly as tolerated, but follow the weight bearing instructions below.  The rules on driving is that you can not be taking narcotics while you drive, and you must feel in control of the vehicle.    Weight Bearing:   As tolerated right leg  To prevent constipation: you may use a stool softener such as -  Colace (over the counter) 100 mg by mouth twice a day  Drink plenty of fluids (prune juice may be helpful) and high fiber foods Miralax (over the counter) for constipation as needed.    Itching:  If you experience itching with your medications, try taking only a single pain pill, or even half a pain pill at a time.  You may take up to 10 pain pills per day, and you can also use benadryl over the counter for itching or also to help with sleep.   Precautions:  If you experience chest pain or shortness of breath - call 911 immediately for transfer to the hospital emergency department!!  If you develop a fever greater that 101 F, purulent drainage from wound, increased redness or drainage from wound, or calf pain -- Call the office at [phone]  Follow- Up Appointment:  Please call for an appointment to be seen in 2 weeks [place] - [phone]

## 2016-10-13 NOTE — Progress Notes (Signed)
Family Medicine Teaching Service Daily Progress Note Intern Pager: [phone]  Patient name: Helen Odom Medical record number: [id number] Date of birth: Oct 20, 1931 Age: 82 y.o. Gender: female  Primary Care Provider: Lind Covert, MD Consultants: ortho Code Status: partial - DNI  Pt Overview and Major Events to Date:  12/5 - Admitted for hip fracture after mechanical fall 12/6 Ortho evaluated, planning pinning 12/7am 12/7 - surgical pinning performed, tolerated well  Assessment and Plan: [name] is a 82 y.o. female presenting with R impacted femoral neck fracture after mechanical fall. PMH is significant for osteoporosis (didn't tolerate bisphosphonates or fosamax - GI upset), depression (currently on zoloft), gait abnormality (hx of eval for parkinson's without diagosis), hx of colon cancer.  # R impacted femoral neck fracture: Surgical pinning conducted this morning.  -ortho consulted, appreciate recs: tolerated well, WBAT, continue chemical VTE prophylaxis  -regular diet today - PT/OT -pain control per ortho: norco, toradol, tylenol -colace and senokot for constipation in the setting of opioid use  # Hypoxia:  No respiratory history or reason for hypoxia on CXR, likely poor respiratory effort in the setting of pain, but cannot r/o possible suppression of respiratory drive 2/2 opiates. Would need to consider fat embolus in the setting of fracture should other vitals change. Last echo 05/2015 with G2DD, no formal CHF diagnosis and no history of exacerbations. G2DD could be normal age-related changes. 2 view CXR without fluid or infectious concerns.  -incentive spirometry -O2 to maintain sats >92 -duonebs once and then PRN  # Depression: worsening per PCP notes.  -continue home zoloft  # Gait abnormality, tremor: bed rest for now until hip repaired. -PT/OT -fall precautions  # Osteoporosis: patient unable to tolerate fosamax or bisphosphonates per chart  review.   -continue home vitamin D   # Osteoarthritis: Patient reports pain in bilateral knees at baseline, worsened after fall. Takes 2 tylenol at home, reports high pain tolerance. -pain control as above for hip fracture  FEN/GI: NPO after MN Prophylaxis: SCDs and lovenox  Disposition: continued inpatient management  Subjective:  Family very displeased as Mrs. Yearwood did not received any pain medicine overnight despite asking for it. I have discussed this with the nursing supervisor.   Objective: Temp:  [97.7 F (36.5 C)-99.1 F (37.3 C)] 98 F (36.7 C) (12/07 1045) Pulse Rate:  [29-108] 83 (12/07 1100) Resp:  [15-24] 18 (12/07 1100) BP: (85-165)/(48-78) 144/63 (12/07 1100) SpO2:  [93 %-99 %] 95 % (12/07 1100) Weight:  [169 lb (76.7 kg)] 169 lb (76.7 kg) (12/06 1643) Physical Exam: General: Elderly female resting comfortably in bed in NAD. Cardiovascular: RRR, no edema Respiratory: CTAB, no wheezing, crackles or increased WOB; relatively shallow controlled breaths Gastrointestinal: soft, nontender, nondistended, + bowel sounds MSK: Bandage clean and dry over R lateral hip.   Derm: No rashes on visualized skin.  Neuro: AOx4. CN II-XII grossly intact.  Psych: mood and affect appropriate  Laboratory:  Recent Labs Lab 10/12/16 0303 10/12/16 1354 10/13/16 0510  WBC 8.2 10.4 10.3  HGB 10.1* 10.8* 10.4*  HCT 32.2* 34.1* 33.3*  PLT 199 206 209    Recent Labs Lab 10/11/16 2042 10/12/16 0303 10/13/16 0510  NA 136 137 137  K 4.3 3.7 3.9  CL 102 104 104  CO2 21* 24 21*  BUN 21* 16 13  CREATININE 0.84 0.63 0.69  CALCIUM 9.5 8.6* 8.8*  GLUCOSE 105* 109* 99    Imaging/Diagnostic Tests: No new imaging since last note.  Curt Bears  Lindell Noe, MD 10/13/2016, 12:15 PM PGY-1, [place] Intern pager: [phone], text pages welcome

## 2016-10-13 NOTE — Transfer of Care (Signed)
Immediate Anesthesia Transfer of Care Note  Patient: Helen Odom  Procedure(s) Performed: Procedure(s): RIGHT HIP PINNING (Right)  Patient Location: PACU  Anesthesia Type:MAC and Spinal  Level of Consciousness: awake, alert , oriented and sedated  Airway & Oxygen Therapy: Patient Spontanous Breathing and Patient connected to nasal cannula oxygen  Post-op Assessment: Report given to RN, Post -op Vital signs reviewed and stable and Patient moving all extremities  Post vital signs: Reviewed  Last Vitals:  Vitals:   10/13/16 0503 10/13/16 0855  BP: (!) 165/67   Pulse: 94   Resp: (!) 22   Temp: 36.7 C (P) 36.5 C    Last Pain:  Vitals:   10/13/16 0503  TempSrc: Oral  PainSc:       Patients Stated Pain Goal: 2 ([id number])  Complications: No apparent anesthesia complications

## 2016-10-13 NOTE — Op Note (Signed)
10/11/2016 - 10/13/2016  8:30 AM  PATIENT:  Helen Odom    PRE-OPERATIVE DIAGNOSIS:  Right Hip Fracture  POST-OPERATIVE DIAGNOSIS:  Same  PROCEDURE:  RIGHT HIP PINNING  SURGEON:  Alek Poncedeleon, Ernesta Amble, MD  ASSISTANT: Roxan Hockey, PA-C, he was present and scrubbed throughout the case, critical for completion in a timely fashion, and for retraction, instrumentation, and closure.   ANESTHESIA:   General  PREOPERATIVE INDICATIONS:  Helen Odom is a  82 y.o. female who fell and was found to have a diagnosis of Right Hip Fracture who elected for surgical management.    The risks benefits and alternatives were discussed with the patient preoperatively including but not limited to the risks of infection, bleeding, nerve injury, cardiopulmonary complications, blood clots, malunion, nonunion, avascular necrosis, the need for revision surgery, the potential for conversion to hemiarthroplasty, among others, and the patient was willing to proceed.  OPERATIVE IMPLANTS: 6.5 mm cannulated screws x3  OPERATIVE FINDINGS: Clinical osteoporosis with weak bone, proximal femur  OPERATIVE PROCEDURE: The patient was brought to the operating room and placed in supine position. IV antibiotics were given. General anesthesia administered. Foley was also given. The patient was placed on the fracture table. The operative extremity was positioned, without any significant reduction maneuver and was prepped and draped in usual sterile fashion.  Time out was performed.  Small incisions were made distal to the greater trochanter, and 3 guidewires were introduced Into an inverted triangle configuration. The lengths were measured. The reduction was slightly valgus, and near-anatomic. I opened the cortex with a cannulated drill, and then placed the screws into position. Satisfactory fixation was achieved. I sequentially tightened the screws by hand.  I performed a live fluoroscopic exam and no screw penetrance was  noted. All threads crossed the fracture site.   The wounds were irrigated copiously, and repaired with Vicryl with Steri-Strips and sterile gauze. There no complications and the patient tolerated the procedure well.  The patient will be weightbearing as tolerated, VTE prophylaxis will be: mobilization and chemical px

## 2016-10-13 NOTE — OR Nursing (Signed)
Dr Conrad Southview updated to spinal anesthesia recovery progress, permission recvd to return pt to room.

## 2016-10-13 NOTE — Anesthesia Postprocedure Evaluation (Signed)
Anesthesia Post Note  Patient: Helen Odom  Procedure(s) Performed: Procedure(s) (LRB): RIGHT HIP PINNING (Right)  Patient location during evaluation: PACU Anesthesia Type: Spinal Level of consciousness: awake and alert Pain management: pain level controlled Vital Signs Assessment: post-procedure vital signs reviewed and stable Respiratory status: spontaneous breathing, nonlabored ventilation, respiratory function stable and patient connected to nasal cannula oxygen Cardiovascular status: blood pressure returned to baseline and stable Postop Assessment: no signs of nausea or vomiting Anesthetic complications: no    Last Vitals:  Vitals:   10/13/16 1057 10/13/16 1100  BP: 122/71 (!) 144/63  Pulse: 91 83  Resp:  18  Temp:      Last Pain:  Vitals:   10/13/16 1000  TempSrc:   PainSc: Asleep                 [name]

## 2016-10-14 MED ORDER — POLYETHYLENE GLYCOL 3350 17 G PO PACK: 17.0000 g | PACK | Freq: Every day | ORAL | Status: DC | PRN

## 2016-10-14 MED ORDER — BOOST / RESOURCE BREEZE PO LIQD
1.0000 | Freq: Three times a day (TID) | ORAL | Status: DC
  Administered 2016-10-14 – 2016-10-15 (×5): 1 via ORAL

## 2016-10-14 MED ORDER — POLYETHYLENE GLYCOL 3350 17 G PO PACK
17.0000 g | PACK | Freq: Once | ORAL | Status: DC
  Filled 2016-10-14: qty 1

## 2016-10-14 NOTE — Progress Notes (Signed)
   Assessment: 1 Day Post-Op  S/P Procedure(s) (LRB): RIGHT HIP PINNING (Right) by Dr. Ernesta Amble. Murphy on 10/13/16  Active Problems:   Closed fracture of right hip ([place])   Fall at home   Shortness of breath  Doing well.  Not OOB yet.  Pain controlled.    Plan: Advance diet Up with therapy  Weight Bearing: Weight Bearing as Tolerated (WBAT) right leg Dressings: prn.  VTE prophylaxis: Lovenox, SCDs, ambulation Dispo: Wants to go back home w/ HHPT to be with her husband with Dementia.  She has 24 hr nursing help. PT eval pending.  At baseline she could walk with walker, but needs help getting up and down.  Subjective: Patient reports pain as moderate. Pain controlled with PO meds.  Tolerating diet.  Urinating.  +Flatus.  No CP, SOB.  Not yet OOB.  Wants to go back home to be with her husband with Dementia.  Objective:   VITALS:   Vitals:   10/14/16 0000 10/14/16 0027 10/14/16 0400 10/14/16 0611  BP: (!) 92/44  (!) 128/57   Pulse: 77 76 75 71  Resp:    17  Temp:  98.2 F (36.8 C)  98.1 F (36.7 C)  TempSrc:  Oral  Oral  SpO2:  94%  95%  Weight:      Height:       CBC Latest Ref Rng & Units 10/13/2016 10/12/2016 10/12/2016  WBC 4.0 - 10.5 K/uL 10.3 10.4 8.2  Hemoglobin 12.0 - 15.0 g/dL 10.4(L) 10.8(L) 10.1(L)  Hematocrit 36.0 - 46.0 % 33.3(L) 34.1(L) 32.2(L)  Platelets 150 - 400 K/uL 209 206 199   BMP Latest Ref Rng & Units 10/13/2016 10/12/2016 10/11/2016  Glucose 65 - 99 mg/dL 99 109(H) 105(H)  BUN 6 - 20 mg/dL 13 16 21(H)  Creatinine 0.44 - 1.00 mg/dL 0.69 0.63 0.84  Sodium 135 - 145 mmol/L 137 137 136  Potassium 3.5 - 5.1 mmol/L 3.9 3.7 4.3  Chloride 101 - 111 mmol/L 104 104 102  CO2 22 - 32 mmol/L 21(L) 24 21(L)  Calcium 8.9 - 10.3 mg/dL 8.8(L) 8.6(L) 9.5   Intake/Output      12/07 0701 - 12/08 0700 12/08 0701 - 12/09 0700   P.O. 100    I.V. (mL/kg) 910 (11.9)    Total Intake(mL/kg) 1010 (13.2)    Urine (mL/kg/hr) 1050 (0.6)    Blood 100 (0.1)    Total  Output 1150     Net -140            Physical Exam: General: NAD.  Upright in bed speaking with niece Rod Holler on the phone. Resp: No increased wob Neurologically intact.  Speech clear and organized MSK Right LE: Neurovascularly intact Sensation intact distally Dorsiflexion/Plantar flexion intact Incision: dressing C/D/I  Prudencio Burly III 10/14/2016, 8:24 AM

## 2016-10-14 NOTE — Progress Notes (Signed)
Family Medicine Teaching Service Daily Progress Note Intern Pager: [phone]  Patient name: Helen Odom Medical record number: [id number] Date of birth: [birth year] Age: 82 y.o. Gender: female  Primary Care Provider: Lind Covert, MD Consultants: ortho Code Status: partial - DNI  Pt Overview and Major Events to Date:  12/5 - Admitted for hip fracture after mechanical fall 12/6 Ortho evaluated, planning pinning 12/7am 12/7 - surgical pinning performed, tolerated well 12/8 - pain well controlled, PT recommending SNF  Assessment and Plan: Helen Odom is a 82 y.o. female presenting with R impacted femoral neck fracture after mechanical fall. PMH is significant for osteoporosis (didn't tolerate bisphosphonates or fosamax - GI upset), depression (currently on zoloft), gait abnormality (hx of eval for parkinson's without diagosis), hx of colon cancer.  # R impacted femoral neck fracture: Surgical pinning conducted 12/7 -ortho consulted, appreciate recs: tolerated well, WBAT, continue chemical VTE prophylaxis for 30 days total -regular diet today - PT/OT: SNF -pain control per ortho: morphine, norco, tylenol  -colace and senokot for constipation in the setting of opioid use, added one miralax scheduled today and then PRN  # Hypoxia, improving. Thought to be poor effort due to pain or concomitant opioids vs underlying undiagnosed lung disease. -incentive spirometry -O2 to maintain sats >92 -duonebs PRN  # Depression: worsening per PCP notes.  -continue home zoloft  # Gait abnormality, tremor: bed rest for now until hip repaired. -PT/OT -fall precautions  # Osteoporosis: patient unable to tolerate fosamax or bisphosphonates per chart review.   -continue home vitamin D   # Osteoarthritis: Patient reports pain in bilateral knees at baseline, worsened after fall. Takes 2 tylenol at home, reports high pain tolerance. -pain control as above for hip fracture  FEN/GI:  NPO after MN Prophylaxis: SCDs and lovenox  Disposition: continued inpatient management  Subjective:  Patient feels well today, has not had a BM. Encouraged IS.   Objective: Temp:  [97.5 F (36.4 C)-98.4 F (36.9 C)] 98.1 F (36.7 C) (12/08 0611) Pulse Rate:  [29-108] 71 (12/08 0611) Resp:  [15-21] 17 (12/08 0611) BP: (85-147)/(44-78) 128/57 (12/08 0400) SpO2:  [89 %-99 %] 95 % (12/08 [id number]) Physical Exam: General: Elderly female resting comfortably in bed in NAD. Cardiovascular: RRR, no edema Respiratory: CTAB, no wheezing, crackles or increased WOB; relatively shallow controlled breaths Gastrointestinal: soft, nontender, nondistended, + bowel sounds MSK: Bandage clean and dry over R lateral hip.   Derm: No rashes on visualized skin.  Neuro: AOx4. CN II-XII grossly intact.  Psych: mood and affect appropriate  Laboratory:  Recent Labs Lab 10/12/16 0303 10/12/16 1354 10/13/16 0510  WBC 8.2 10.4 10.3  HGB 10.1* 10.8* 10.4*  HCT 32.2* 34.1* 33.3*  PLT 199 206 209    Recent Labs Lab 10/11/16 2042 10/12/16 0303 10/13/16 0510  NA 136 137 137  K 4.3 3.7 3.9  CL 102 104 104  CO2 21* 24 21*  BUN 21* 16 13  CREATININE 0.84 0.63 0.69  CALCIUM 9.5 8.6* 8.8*  GLUCOSE 105* 109* 99    Imaging/Diagnostic Tests: No new imaging since last note.  Sela Hilding, MD 10/14/2016, 7:21 AM PGY-1, [place] Intern pager: [phone], text pages welcome

## 2016-10-14 NOTE — Evaluation (Signed)
Occupational Therapy Evaluation Patient Details Name: Helen Odom MRN: [id number] DOB: December 15, 1930 Today's Date: 10/14/2016    History of Present Illness Pt is an 82 y.o. female s/p R hip pinning. PMHx: Arthritis, Cancer, Depression, Heart murmur, Hyperlipidemia, Neuropathy, Osteoporosis.   Clinical Impression   Pt reports she required assist with ADL and mobility PTA. Currently pt max assist +2 for basic transfers, mod assist for seated UB ADL, and max +2 for LB ADL. Discussed SNF vs home with [facility] with pt and family; pt and family would like d/c home with [facility] therapies for follow up. Pt does have hired caregivers 24/7 upon return home that can physically assist as needed. Recommending HHOT for follow up to maximize independence and safety with ADL and functional mobility. Pt would benefit from continued skilled OT to address established goals.    Follow Up Recommendations  Home health OT;Supervision/Assistance - 24 hour    Equipment Recommendations  3 in 1 bedside commode    Recommendations for Other Services PT consult     Precautions / Restrictions Precautions Precautions: Fall Restrictions Weight Bearing Restrictions: Yes RLE Weight Bearing: Weight bearing as tolerated      Mobility Bed Mobility Overal bed mobility: Needs Assistance Bed Mobility: Supine to Sit     Supine to sit: Max assist;HOB elevated     General bed mobility comments: Max assist for bil LEs to EOB, assist for trunk elevation from supine to sit and to scoot hips out to EOB.  Transfers Overall transfer level: Needs assistance Equipment used: 2 person hand held assist Transfers: Sit to/from [facility] Sit to Stand: Max assist;+2 physical assistance Stand pivot transfers: Max assist;+2 physical assistance       General transfer comment: Attempted sit to stand from EOB with RW and +1 assist; pt unable to perform. Pt able to come to standing with max assist +2 with use of bed pad  to boost up.    Balance Overall balance assessment: Needs assistance Sitting-balance support: Feet supported;Bilateral upper extremity supported Sitting balance-Leahy Scale: Fair     Standing balance support: Bilateral upper extremity supported Standing balance-Leahy Scale: Poor                              ADL Overall ADL's : Needs assistance/impaired Eating/Feeding: Set up;Sitting   Grooming: Minimal assistance;Sitting   Upper Body Bathing: Moderate assistance;Sitting   Lower Body Bathing: Maximal assistance;+2 for physical assistance;Sit to/from stand   Upper Body Dressing : Moderate assistance;Sitting   Lower Body Dressing: Maximal assistance;+2 for physical assistance;Sit to/from stand   Toilet Transfer: Maximal assistance;+2 for physical assistance;Stand-pivot;BSC Toilet Transfer Details (indicate cue type and reason): Simulated by stand pivot from EOB to chair         Functional mobility during ADLs: Maximal assistance;+2 for physical assistance (for stand pivot only) General ADL Comments: Discussed post acute rehab vs home. Pt would prefer to d/c home as she has 24/7 hired caregivers that can physically assist as needed with [facility] therapy for follow up.     Vision     Perception     Praxis      Pertinent Vitals/Pain Pain Assessment: Faces Faces Pain Scale: Hurts even more Pain Location: R hip Pain Descriptors / Indicators: Aching;Grimacing;Guarding;Operative site guarding Pain Intervention(s): Monitored during session;Repositioned;Premedicated before session     Hand Dominance Right   Extremity/Trunk Assessment Upper Extremity Assessment Upper Extremity Assessment: Generalized weakness   Lower  Extremity Assessment Lower Extremity Assessment: Defer to PT evaluation   Cervical / Trunk Assessment Cervical / Trunk Assessment: Kyphotic   Communication Communication Communication: No difficulties   Cognition Arousal/Alertness:  Awake/alert Behavior During Therapy: WFL for tasks assessed/performed Overall Cognitive Status: Within Functional Limits for tasks assessed                     General Comments       Exercises       Shoulder Instructions      Home Living Family/patient expects to be discharged to:: Private residence Living Arrangements: Spouse/significant other Available Help at Discharge: Family;Personal care attendant;Available 24 hours/day (PCA 24/7) Type of Home: House       Home Layout: One level     Bathroom Shower/Tub: Occupational psychologist: Handicapped height     Home Equipment: Wheelchair - manual;Shower seat          Prior Functioning/Environment Level of Independence: Needs assistance  Gait / Transfers Assistance Needed: RW for household mobility ADL's / Homemaking Assistance Needed: Caregivers assist with ADL            OT Problem List: Decreased strength;Decreased range of motion;Decreased activity tolerance;Impaired balance (sitting and/or standing);Decreased safety awareness;Decreased knowledge of use of DME or AE;Decreased knowledge of precautions;Obesity;Pain   OT Treatment/Interventions: Self-care/ADL training;Energy conservation;DME and/or AE instruction;Therapeutic activities;Patient/family education;Balance training    OT Goals(Current goals can be found in the care plan section) Acute Rehab OT Goals Patient Stated Goal: return home to be with husband OT Goal Formulation: With patient Time For Goal Achievement: 10/28/16 Potential to Achieve Goals: Good ADL Goals Pt Will Perform Grooming: with supervision;sitting Pt Will Perform Upper Body Bathing: with supervision;sitting Pt Will Perform Lower Body Bathing: with min assist;sit to/from stand Pt Will Transfer to Toilet: with min assist;ambulating;bedside commode Pt Will Perform Toileting - Clothing Manipulation and hygiene: with min assist;sit to/from stand  OT Frequency: Min 2X/week    Barriers to D/C:            Co-evaluation              End of Session Equipment Utilized During Treatment: Gait belt Nurse Communication: Mobility status (RN assisting with transfer)  Activity Tolerance: Patient tolerated treatment well Patient left: in chair;with call bell/phone within reach;with chair alarm set   Time: 1334-1401 OT Time Calculation (min): 27 min Charges:  OT General Charges $OT Visit: 1 Procedure OT Evaluation $OT Eval Moderate Complexity: 1 Procedure OT Treatments $Self Care/Home Management : 8-22 mins G-Codes:     Binnie Kand M.S., OTR/L Pager: [phone]  10/14/2016, 3:18 PM

## 2016-10-14 NOTE — Progress Notes (Signed)
Occupational Therapy Treatment Patient Details Name: Helen Odom MRN: [id number] DOB: [birth year] Today's Date: 10/14/2016    History of present illness Pt is an 82 y.o. female s/p R hip pinning. PMHx: Arthritis, Cancer, Depression, Heart murmur, Hyperlipidemia, Neuropathy, Osteoporosis.   OT comments  Per RN request; pt assisted back to bed via stand pivot transfer with max assist +2. Pt with increased confusion this afternoon. D/c plan remains appropriate. Will continue to follow acutely.   Follow Up Recommendations  Home health OT;Supervision/Assistance - 24 hour    Equipment Recommendations  3 in 1 bedside commode    Recommendations for Other Services PT consult    Precautions / Restrictions Precautions Precautions: Fall Restrictions Weight Bearing Restrictions: Yes RLE Weight Bearing: Weight bearing as tolerated LLE Weight Bearing: Weight bearing as tolerated       Mobility Bed Mobility Overal bed mobility: Needs Assistance Bed Mobility: Sit to Supine     Supine to sit: Max assist;+2 for physical assistance     General bed mobility comments: Assist for LEs and trunk.  Transfers Overall transfer level: Needs assistance Equipment used: 2 person hand held assist Transfers: Sit to/from [facility] Sit to Stand: Max assist;+2 physical assistance Stand pivot transfers: Max assist;+2 physical assistance       General transfer comment: Use of bed pad and 2 person assist for stand pivot from chair to bed.    Balance Overall balance assessment: Needs assistance Sitting-balance support: Feet supported;Bilateral upper extremity supported Sitting balance-Leahy Scale: Fair                         ADL Overall ADL's : Needs assistance/impaired          Functional mobility during ADLs: Maximal assistance;+2 for physical assistance (for stand pivot only) General ADL Comments: Per RN request; pt assisted back to bed via stand pivot  transfer with max assist +2.       Vision                     Perception     Praxis      Cognition   Behavior During Therapy: WFL for tasks assessed/performed Overall Cognitive Status: No family/caregiver present to determine baseline cognitive functioning                  General Comments: Increased confusion this afternoon    Extremity/Trunk Assessment  Upper Extremity Assessment Upper Extremity Assessment: Generalized weakness   Lower Extremity Assessment Lower Extremity Assessment: Generalized weakness   Cervical / Trunk Assessment Cervical / Trunk Assessment: Kyphotic    Exercises     Shoulder Instructions       General Comments      Pertinent Vitals/ Pain       Pain Assessment: Faces Faces Pain Scale: Hurts whole lot Pain Location: R hip Pain Descriptors / Indicators: Aching;Grimacing Pain Intervention(s): Monitored during session;Repositioned;RN gave pain meds during session  Home Living Family/patient expects to be discharged to:: Private residence Living Arrangements: Spouse/significant other Available Help at Discharge: Family;Personal care attendant;Available 24 hours/day Type of Home: House       Home Layout: One level     Bathroom Shower/Tub: Occupational psychologist: Handicapped height     Home Equipment: Wheelchair - Insurance claims handler - 4 wheels          Prior Functioning/Environment Level of Independence: Needs assistance  Gait / Transfers Assistance Needed: RW for household mobility  ADL's / Homemaking Assistance Needed: Caregivers assist with ADL       Frequency  Min 2X/week        Progress Toward Goals  OT Goals(current goals can now be found in the care plan section)  Progress towards OT goals: Not progressing toward goals - comment  Acute Rehab OT Goals Patient Stated Goal: return home to be with husband OT Goal Formulation: With patient Time For Goal Achievement:  10/28/16 Potential to Achieve Goals: Good ADL Goals Pt Will Perform Grooming: with supervision;sitting Pt Will Perform Upper Body Bathing: with supervision;sitting Pt Will Perform Lower Body Bathing: with min assist;sit to/from stand Pt Will Transfer to Toilet: with min assist;ambulating;bedside commode Pt Will Perform Toileting - Clothing Manipulation and hygiene: with min assist;sit to/from stand  Plan Discharge plan remains appropriate    Co-evaluation                 End of Session Equipment Utilized During Treatment: Gait belt   Activity Tolerance Patient tolerated treatment well   Patient Left in bed;with call bell/phone within reach;with bed alarm set;with nursing/sitter in room   Nurse Communication Mobility status (RN assisting)        Time: [id number] OT Time Calculation (min): 10 min  Charges: OT General Charges $OT Visit: 1 Procedure  OT Treatments $Therapeutic Activity: 8-22 mins  Binnie Kand M.S., OTR/L Pager: [phone]  10/14/2016, 5:31 PM

## 2016-10-14 NOTE — Care Management Important Message (Signed)
Important Message  Patient Details  Name: Helen Odom MRN: [id number] Date of Birth: 10-07-1931   Medicare Important Message Given:  Yes    Nathen May 10/14/2016, 12:24 PM

## 2016-10-14 NOTE — Evaluation (Signed)
Physical Therapy Evaluation Patient Details Name: Helen Odom MRN: [id number] DOB: 12-Sep-1931 Today's Date: 10/14/2016   History of Present Illness  Pt is an 82 y.o. female s/p R hip pinning. PMHx: Arthritis, Cancer, Depression, Heart murmur, Hyperlipidemia, Neuropathy, Osteoporosis.  Clinical Impression  Pt was up to chair when PT arrived, has been able to assist standing attempt but very weak and not able to fully stand.  Pt also was confused about which leg was injured, and did not change her mind when PT asked her specifically about this.  Her plan is to try to mobilize as much as she can tolerate, then to transition to SNF level of care due to the extent of her debility.    Follow Up Recommendations SNF    Equipment Recommendations  None recommended by PT    Recommendations for Other Services       Precautions / Restrictions Precautions Precautions: Fall Restrictions Weight Bearing Restrictions: Yes RLE Weight Bearing: Weight bearing as tolerated LLE Weight Bearing: Weight bearing as tolerated      Mobility  Bed Mobility Overal bed mobility: Needs Assistance Bed Mobility: Supine to Sit     Supine to sit: Max assist;HOB elevated     General bed mobility comments: up when PT arrived  Transfers Overall transfer level: Needs assistance Equipment used: Rolling walker (2 wheeled);1 person hand held assist Transfers: Sit to/from Stand Sit to Stand: Max assist;From elevated surface (unable to fully stand) Stand pivot transfers: Max assist;+2 physical assistance       General transfer comment: Attempted sit to stand from EOB with RW and +1 assist; pt unable to perform. Pt able to come to standing with max assist +2 with use of bed pad to boost up.  Ambulation/Gait             General Gait Details: unable to stand fully  Stairs            Wheelchair Mobility    Modified Rankin (Stroke Patients Only)       Balance Overall balance assessment:  Needs assistance Sitting-balance support: Feet supported;Bilateral upper extremity supported Sitting balance-Leahy Scale: Fair     Standing balance support: Bilateral upper extremity supported Standing balance-Leahy Scale: Poor                               Pertinent Vitals/Pain Pain Assessment: No/denies pain Faces Pain Scale: Hurts a little bit Pain Location: R hip Pain Descriptors / Indicators: Aching;Operative site guarding Pain Intervention(s): Limited activity within patient's tolerance;Monitored during session;Premedicated before session;Repositioned    Home Living Family/patient expects to be discharged to:: Private residence Living Arrangements: Spouse/significant other Available Help at Discharge: Family;Personal care attendant;Available 24 hours/day Type of Home: House       Home Layout: One level Home Equipment: Wheelchair - Insurance claims handler - 4 wheels      Prior Function Level of Independence: Needs assistance   Gait / Transfers Assistance Needed: RW for household mobility  ADL's / Homemaking Assistance Needed: Caregivers assist with ADL        Hand Dominance   Dominant Hand: Right    Extremity/Trunk Assessment   Upper Extremity Assessment: Generalized weakness           Lower Extremity Assessment: Generalized weakness      Cervical / Trunk Assessment: Kyphotic  Communication   Communication: No difficulties  Cognition Arousal/Alertness: Awake/alert Behavior During Therapy: WFL for tasks assessed/performed Overall Cognitive  Status: No family/caregiver present to determine baseline cognitive functioning                 General Comments: Pt insisted her L hip was injured side    General Comments General comments (skin integrity, edema, etc.): Pt has bruising over LE skin due to falling on her knees in the fracture    Exercises     Assessment/Plan    PT Assessment Patient needs continued PT services  PT  Problem List Decreased strength;Decreased range of motion;Decreased activity tolerance;Decreased balance;Decreased mobility;Decreased coordination;Decreased cognition;Decreased knowledge of use of DME;Decreased safety awareness;Decreased knowledge of precautions;Cardiopulmonary status limiting activity;Obesity;Decreased skin integrity;Pain          PT Treatment Interventions DME instruction;Gait training;Functional mobility training;Therapeutic activities;Therapeutic exercise;Balance training;Neuromuscular re-education;Patient/family education    PT Goals (Current goals can be found in the Care Plan section)  Acute Rehab PT Goals Patient Stated Goal: return home to be with husband PT Goal Formulation: With patient Time For Goal Achievement: 10/28/16 Potential to Achieve Goals: Good    Frequency 7X/week   Barriers to discharge   has 2 person assistance needed, not sure if home allows this    Co-evaluation               End of Session Equipment Utilized During Treatment: Gait belt Activity Tolerance: Patient tolerated treatment well;Patient limited by fatigue Patient left: in chair;with call bell/phone within reach;with chair alarm set Nurse Communication: Mobility status         Time: 1425-1452 PT Time Calculation (min) (ACUTE ONLY): 27 min   Charges:   PT Evaluation $PT Eval Moderate Complexity: 1 Procedure PT Treatments $Therapeutic Activity: 8-22 mins   PT G Codes:        Ramond Dial 11-10-16, 3:46 PM  [name], PT MS Acute Rehab Dept. Number: [place] [facility] [place]

## 2016-10-14 NOTE — Progress Notes (Signed)
PT Cancellation Note  Patient Details Name: Helen Odom MRN: [id number] DOB: May 06, 1931   Cancelled Treatment:    Reason Eval/Treat Not Completed: Other (comment) (bedrest in family medicine note, will check later).   Ramond Dial 10/14/2016, 12:59 PM    [name], PT MS Acute Rehab Dept. Number: [place]

## 2016-10-15 LAB — BASIC METABOLIC PANEL
Anion gap: 9 (ref 5–15)
BUN: 16 mg/dL (ref 6–20)
CHLORIDE: 108 mmol/L (ref 101–111)
CO2: 22 mmol/L (ref 22–32)
Calcium: 8.5 mg/dL — ABNORMAL LOW (ref 8.9–10.3)
Creatinine, Ser: 0.51 mg/dL (ref 0.44–1.00)
GFR calc Af Amer: >60 mL/min (ref >60)
GFR calc non Af Amer: >60 mL/min (ref >60)
GLUCOSE: 100 mg/dL — AB (ref 65–99)
POTASSIUM: 3.7 mmol/L (ref 3.5–5.1)
Sodium: 139 mmol/L (ref 135–145)

## 2016-10-15 LAB — CBC
HCT: 28.5 % — ABNORMAL LOW (ref 36.0–46.0)
Hemoglobin: 9.1 g/dL — ABNORMAL LOW (ref 12.0–15.0)
MCH: 29.6 pg (ref 26.0–34.0)
MCHC: 31.9 g/dL (ref 30.0–36.0)
MCV: 92.8 fL (ref 78.0–100.0)
Platelets: 201 10*3/uL (ref 150–400)
RBC: 3.07 MIL/uL — ABNORMAL LOW (ref 3.87–5.11)
RDW: 14.8 % (ref 11.5–15.5)
WBC: 7.2 10*3/uL (ref 4.0–10.5)

## 2016-10-15 NOTE — Progress Notes (Signed)
CSW received consult regarding PT recommendation of SNF at discharge.  Patient is refusing SNF. She would like to go home with home health services. She and her husband have a 24 hour a day caregiver at home.   CSW signing off.   Percell Locus Laiyla Slagel LCSWA [phone]

## 2016-10-15 NOTE — Progress Notes (Signed)
Family Medicine Teaching Service Daily Progress Note Intern Pager: [phone]  Patient name: Helen Odom Medical record number: [id number] Date of birth: [birth year] Age: 82 y.o. Gender: female  Primary Care Provider: Lind Covert, MD Consultants: ortho Code Status: partial - DNI  Pt Overview and Major Events to Date:  12/5 - Admitted for hip fracture after mechanical fall 12/6 Ortho evaluated, planning pinning 12/7am 12/7 - surgical pinning performed, tolerated well 12/8 - pain well controlled, PT recommending SNF  Assessment and Plan: Helen Odom is a 82 y.o. female presenting with R impacted femoral neck fracture after mechanical fall. PMH is significant for osteoporosis (didn't tolerate bisphosphonates or fosamax - GI upset), depression (currently on zoloft), gait abnormality (hx of eval for parkinson's without diagosis), hx of colon cancer.  # R impacted femoral neck fracture: Surgical pinning conducted 12/7 -ortho consulted, appreciate recs: tolerated well, WBAT, continue chemical VTE prophylaxis for 30 days total -regular diet today - PT/OT: SNF -pain control per ortho: morphine, norco, tylenol  -colace and senokot for constipation in the setting of opioid use, miralax PRN  # Hypoxia, improving.  -incentive spirometry -O2 to maintain sats >92 -duonebs PRN  # Depression: worsening per PCP notes.  -continue home zoloft  # Gait abnormality, tremor:  -PT/OT -fall precautions  # Osteoporosis: patient unable to tolerate fosamax or bisphosphonates per chart review.   -continue home vitamin D   # Osteoarthritis: Patient reports pain in bilateral knees at baseline, worsened after fall. Takes 2 tylenol at home, reports high pain tolerance. -pain control as above for hip fracture  FEN/GI: NPO after MN Prophylaxis: SCDs and lovenox  Disposition: continued inpatient management, SNF, SW consulted  Subjective:  Patient feels well today, no chest pain, SOB,  hip pain stable  Objective: Temp:  [98.1 F (36.7 C)-99.8 F (37.7 C)] 98.1 F (36.7 C) (12/09 0616) Pulse Rate:  [68-93] 68 (12/09 0616) Resp:  [16-18] 16 (12/09 0616) BP: (131-147)/(54-71) 147/65 (12/09 0616) SpO2:  [83 %-98 %] 97 % (12/09 0616) FiO2 (%):  [2 %] 2 % (12/09 [id number]) Physical Exam: General: NAD. Cardiovascular: RRR, no edema Respiratory: CTAB, no wheezing, crackles or increased WOB Gastrointestinal: soft, nontender, nondistended, + bowel sounds MSK: Bandage clean and dry over R lateral hip. No evidence of hematoma  Derm: No rashes on visualized skin.  Neuro: AOx4.  Psych: mood and affect appropriate  Laboratory:  Recent Labs Lab 10/12/16 1354 10/13/16 0510 10/15/16 0530  WBC 10.4 10.3 7.2  HGB 10.8* 10.4* 9.1*  HCT 34.1* 33.3* 28.5*  PLT 206 209 201    Recent Labs Lab 10/12/16 0303 10/13/16 0510 10/15/16 0530  NA 137 137 139  K 3.7 3.9 3.7  CL 104 104 108  CO2 24 21* 22  BUN 16 13 16   CREATININE 0.63 0.69 0.51  CALCIUM 8.6* 8.8* 8.5*  GLUCOSE 109* 99 100*    Imaging/Diagnostic Tests: No new imaging since last note.  Veatrice Bourbon, MD 10/15/2016, 8:40 AM PGY-3, [place] Intern pager: [phone], text pages welcome

## 2016-10-15 NOTE — Progress Notes (Signed)
Physical Therapy Treatment Patient Details Name: Helen Odom MRN: [id number] DOB: Sep 18, 1931 Today's Date: 10/15/2016    History of Present Illness Pt is an 82 y.o. female s/p R hip pinning. PMHx: Arthritis, Cancer, Depression, Heart murmur, Hyperlipidemia, Neuropathy, Osteoporosis.    PT Comments    Pt's mobility is progressing very slowly  She demo difficulty with advancing either leg during pivot transfer.  She spent >1 hr to eat her meal as she requested for therapy to return later upon first attempt.  Pt may have unrealistic awareness and goals at this time.  Therapy will continue to follow to assist with discharge planning and follow up recommendations.Will continue to follow patient while on this venue of care to progress mobility.   Follow Up Recommendations  SNF;Supervision/Assistance - 24 hour     Equipment Recommendations  None recommended by PT    Recommendations for Other Services       Precautions / Restrictions Precautions Precautions: Fall Restrictions Weight Bearing Restrictions: Yes RLE Weight Bearing: Weight bearing as tolerated    Mobility  Bed Mobility Overal bed mobility: Needs Assistance Bed Mobility: Supine to Sit     Supine to sit: +2 for physical assistance     General bed mobility comments: mod cues for sequencing, mod assist bil LEs  Transfers Overall transfer level: Needs assistance Equipment used: Rolling walker (2 wheeled) Transfers: Sit to/from [employer] Transfers Sit to Stand: +2 physical assistance;Max assist Stand pivot transfers: Max assist;+2 physical assistance       General transfer comment: mod cues for hand placement, decr left ankle DF, blocked her left foot with therapist's foot to reduce risk of slipping, max assist to advance either foot  Ambulation/Gait                 Stairs            Wheelchair Mobility    Modified Rankin (Stroke Patients Only)       Balance Overall balance  assessment: Needs assistance Sitting-balance support: Bilateral upper extremity supported;Feet supported Sitting balance-Leahy Scale: Poor Sitting balance - Comments: assist for safety Postural control: Posterior lean Standing balance support: Bilateral upper extremity supported Standing balance-Leahy Scale: Zero Standing balance comment: heavy reliance on walker                    Cognition Arousal/Alertness: Awake/alert Behavior During Therapy: WFL for tasks assessed/performed Overall Cognitive Status: No family/caregiver present to determine baseline cognitive functioning                 General Comments: pt appears to be oriented to place and situation    Exercises General Exercises - Lower Extremity Ankle Circles/Pumps: AROM;Both;10 reps;Seated    General Comments General comments (skin integrity, edema, etc.): pt moves very slowly, needs incr time for all tasks      Pertinent Vitals/Pain Pain Assessment: No/denies pain Pain Intervention(s): Limited activity within patient's tolerance;Monitored during session;Premedicated before session    Home Living                      Prior Function            PT Goals (current goals can now be found in the care plan section) Acute Rehab PT Goals Patient Stated Goal: none stated PT Goal Formulation: With patient Time For Goal Achievement: 10/28/16 Potential to Achieve Goals: Good Progress towards PT goals: Not progressing toward goals - comment    Frequency  7X/week      PT Plan Current plan remains appropriate    Co-evaluation             End of Session Equipment Utilized During Treatment: Gait belt Activity Tolerance: Patient tolerated treatment well;No increased pain Patient left: in chair;with call bell/phone within reach;with chair alarm set     Time: [id number] PT Time Calculation (min) (ACUTE ONLY): 22 min  Charges:  $Therapeutic Activity: 23-37 mins                    G  CodesMalka So, PT [phone]  Shyam Dawson 10/15/2016, 3:16 PM

## 2016-10-15 NOTE — Progress Notes (Signed)
SPORTS MEDICINE AND JOINT REPLACEMENT  Lara Mulch, MD    Carlyon Shadow, PA-C [place], [place],   [id number]                             [phone]   PROGRESS NOTE  Subjective:  negative for Chest Pain  negative for Shortness of Breath  negative for Nausea/Vomiting   negative for Calf Pain  negative for Bowel Movement   Tolerating Diet: yes         Patient reports pain as 4 on 0-10 scale.    Objective: Vital signs in last 24 hours:   Patient Vitals for the past 24 hrs:  BP Temp Temp src Pulse Resp SpO2  10/15/16 0616 (!) 147/65 98.1 F (36.7 C) Oral 68 16 97 %  10/14/16 2158 (!) 131/54 99.8 F (37.7 C) Oral 77 17 98 %  10/14/16 1907 - - - 91 - 93 %  10/14/16 1322 140/71 98.9 F (37.2 C) Oral 93 18 (!) 83 %    @flow {1959:LAST@   Intake/Output from previous day:   12/08 0701 - 12/09 0700 In: 1000 [P.O.:600] Out: 1350 [Urine:1350]   Intake/Output this shift:   No intake/output data recorded.   Intake/Output      12/08 0701 - 12/09 0700 12/09 0701 - 12/10 0700   P.O. 600    I.V. (mL/kg)     Other 400    Total Intake(mL/kg) 1000 (13)    Urine (mL/kg/hr) 1350 (0.7)    Blood     Total Output 1350     Net -350             LABORATORY DATA:  Recent Labs  10/11/16 2042 10/12/16 0303 10/12/16 1354 10/13/16 0510 10/15/16 0530  WBC 11.7* 8.2 10.4 10.3 7.2  HGB 12.2 10.1* 10.8* 10.4* 9.1*  HCT 38.6 32.2* 34.1* 33.3* 28.5*  PLT 263 199 206 209 201    Recent Labs  10/11/16 2042 10/12/16 0303 10/13/16 0510 10/15/16 0530  NA 136 137 137 139  K 4.3 3.7 3.9 3.7  CL 102 104 104 108  CO2 21* 24 21* 22  BUN 21* 16 13 16   CREATININE 0.84 0.63 0.69 0.51  GLUCOSE 105* 109* 99 100*  CALCIUM 9.5 8.6* 8.8* 8.5*   Lab Results  Component Value Date   INR 1.00 10/11/2016    Examination:  General appearance: alert, cooperative and no distress Extremities: extremities normal, atraumatic, no cyanosis or edema  Wound Exam: clean, dry,  intact   Drainage:  None: wound tissue dry  Motor Exam: Quadriceps and Hamstrings Intact  Sensory Exam: Superficial Peroneal, Deep Peroneal and Tibial normal   Assessment:    2 Days Post-Op  Procedure(s) (LRB): RIGHT HIP PINNING (Right)  ADDITIONAL DIAGNOSIS:  Active Problems:   Closed fracture of right hip (HCC)   Fall at home   Shortness of breath     Plan: Physical Therapy as ordered Weight Bearing as Tolerated (WBAT)  DVT Prophylaxis:  Lovenox  DISCHARGE PLAN: Skilled Nursing Facility/Rehab  DISCHARGE NEEDS: HHPT   Patient is doing well from an orthopedic standpoint. Will continue to follow through the weekend until cleared by medicine.          Donia Ast 10/15/2016, 9:02 AM

## 2016-10-15 NOTE — Progress Notes (Signed)
Upon initial assessment pt noted with foley in place ,  Removed . No signs of infection . Pt tolerated well

## 2016-10-16 DIAGNOSIS — R0600 Dyspnea, unspecified: Secondary | ICD-10-CM

## 2016-10-16 DIAGNOSIS — F330 Major depressive disorder, recurrent, mild: Secondary | ICD-10-CM

## 2016-10-16 DIAGNOSIS — R0609 Other forms of dyspnea: Secondary | ICD-10-CM

## 2016-10-16 DIAGNOSIS — S72009D Fracture of unspecified part of neck of unspecified femur, subsequent encounter for closed fracture with routine healing: Secondary | ICD-10-CM

## 2016-10-16 DIAGNOSIS — M8000XD Age-related osteoporosis with current pathological fracture, unspecified site, subsequent encounter for fracture with routine healing: Secondary | ICD-10-CM

## 2016-10-16 DIAGNOSIS — R0989 Other specified symptoms and signs involving the circulatory and respiratory systems: Secondary | ICD-10-CM

## 2016-10-16 LAB — CBC
HEMATOCRIT: 31.6 % — AB (ref 36.0–46.0)
HEMOGLOBIN: 9.9 g/dL — AB (ref 12.0–15.0)
MCH: 28.9 pg (ref 26.0–34.0)
MCHC: 31.3 g/dL (ref 30.0–36.0)
MCV: 92.4 fL (ref 78.0–100.0)
Platelets: 232 10*3/uL (ref 150–400)
RBC: 3.42 MIL/uL — ABNORMAL LOW (ref 3.87–5.11)
RDW: 14.7 % (ref 11.5–15.5)
WBC: 9.1 10*3/uL (ref 4.0–10.5)

## 2016-10-16 LAB — BASIC METABOLIC PANEL
ANION GAP: 10 (ref 5–15)
BUN: 17 mg/dL (ref 6–20)
CALCIUM: 8.5 mg/dL — AB (ref 8.9–10.3)
CO2: 22 mmol/L (ref 22–32)
Chloride: 108 mmol/L (ref 101–111)
Creatinine, Ser: 0.52 mg/dL (ref 0.44–1.00)
GFR calc Af Amer: >60 mL/min (ref >60)
Glucose, Bld: 95 mg/dL (ref 65–99)
POTASSIUM: 3.9 mmol/L (ref 3.5–5.1)
SODIUM: 140 mmol/L (ref 135–145)

## 2016-10-16 MED ORDER — ENSURE ENLIVE PO LIQD
237.0000 mL | Freq: Two times a day (BID) | ORAL | Status: DC
  Administered 2016-10-16 – 2016-10-17 (×3): 237 mL via ORAL

## 2016-10-16 NOTE — Progress Notes (Addendum)
Physical Therapy Treatment Patient Details Name: SHULANDA SCHLOEMER MRN: [id number] DOB: 27-Mar-1931 Today's Date: 10/16/2016    History of Present Illness Pt is an 82 y.o. female s/p R hip pinning. PMHx: Arthritis, Cancer, Depression, Heart murmur, Hyperlipidemia, Neuropathy, Osteoporosis.    PT Comments    Pt able to begin gait progression today. Donned pt's shoes for gait in place of gripper socks. Extensive time and assist required to ambulate 3 feet x 2 trials with RW. Pt and family now agreeable to ST SNF at d/c.   Follow Up Recommendations  SNF;Supervision/Assistance - 24 hour     Equipment Recommendations  None recommended by PT    Recommendations for Other Services       Precautions / Restrictions Precautions Precautions: Fall Restrictions RLE Weight Bearing: Weight bearing as tolerated    Mobility  Bed Mobility               General bed mobility comments: Pt received in recliner.  Transfers   Equipment used: Rolling walker (2 wheeled)   Sit to Stand: +2 physical assistance;Max assist         General transfer comment: Pt very retropulsive with max assist required to maintain upright stance.  Ambulation/Gait Ambulation/Gait assistance: Max assist;+2 physical assistance Ambulation Distance (Feet): 3 Feet (x 2) Assistive device: Rolling walker (2 wheeled) Gait Pattern/deviations: Step-to pattern;Decreased stride length;Decreased weight shift to right Gait velocity: very slow Gait velocity interpretation: <1.8 ft/sec, indicative of risk for recurrent falls General Gait Details: Extensive time and assist required to advance steps. Assist to propel LLE through swing phase.    Stairs            Wheelchair Mobility    Modified Rankin (Stroke Patients Only)       Balance           Standing balance support: During functional activity;Bilateral upper extremity supported Standing balance-Leahy Scale: Zero Standing balance comment: heavy  reliance on walker and therapist to maintain stance                    Cognition Arousal/Alertness: Awake/alert Behavior During Therapy: WFL for tasks assessed/performed Overall Cognitive Status: Within Functional Limits for tasks assessed                      Exercises      General Comments        Pertinent Vitals/Pain Faces Pain Scale: Hurts a little bit Pain Location: R hip Pain Descriptors / Indicators: Guarding;Grimacing Pain Intervention(s): Monitored during session    Home Living                      Prior Function            PT Goals (current goals can now be found in the care plan section) Acute Rehab PT Goals Patient Stated Goal: none stated PT Goal Formulation: With patient Time For Goal Achievement: 10/28/16 Potential to Achieve Goals: Good Progress towards PT goals: Progressing toward goals (slowly)    Frequency    7X/week      PT Plan Current plan remains appropriate    Co-evaluation             End of Session Equipment Utilized During Treatment: Gait belt Activity Tolerance: Patient tolerated treatment well Patient left: in chair;with call bell/phone within reach;with family/visitor present;with SCD's reapplied     Time: 1335-1406 PT Time Calculation (min) (ACUTE ONLY): 31 min  Charges:  $  Gait Training: 23-37 mins                    G Codes:      Lorriane Shire 10/16/2016, 2:52 PM

## 2016-10-16 NOTE — Progress Notes (Signed)
SPORTS MEDICINE AND JOINT REPLACEMENT  Lara Mulch, MD    Carlyon Shadow, PA-C [place], [place], [place]  [address]                             [phone]   PROGRESS NOTE  Subjective:  negative for Chest Pain  negative for Shortness of Breath  negative for Nausea/Vomiting   negative for Calf Pain  negative for Bowel Movement   Tolerating Diet: yes         Patient reports pain as 4 on 0-10 scale.    Objective: Vital signs in last 24 hours:   Patient Vitals for the past 24 hrs:  BP Temp Temp src Pulse Resp SpO2  10/16/16 0541 (!) 141/61 97.8 F (36.6 C) Oral 64 18 95 %  10/15/16 2140 (!) 123/51 99.2 F (37.3 C) Oral 86 18 91 %  10/15/16 1611 135/75 - - 89 18 94 %    @flow {1959:LAST@   Intake/Output from previous day:   12/09 0701 - 12/10 0700 In: [id number] [P.O.:780; I.V.:800] Out: 100 [Urine:100]   Intake/Output this shift:   No intake/output data recorded.   Intake/Output      12/09 0701 - 12/10 0700 12/10 0701 - 12/11 0700   P.O. 780    I.V. (mL/kg) 800 (10.4)    Other     Total Intake(mL/kg) 1580 (20.6)    Urine (mL/kg/hr) 100 (0.1)    Total Output 100     Net +1480          Urine Occurrence 7 x       LABORATORY DATA:  Recent Labs  10/11/16 2042 10/12/16 0303 10/12/16 1354 10/13/16 0510 10/15/16 0530 10/16/16 0707  WBC 11.7* 8.2 10.4 10.3 7.2 9.1  HGB 12.2 10.1* 10.8* 10.4* 9.1* 9.9*  HCT 38.6 32.2* 34.1* 33.3* 28.5* 31.6*  PLT 263 199 206 209 201 232    Recent Labs  10/11/16 2042 10/12/16 0303 10/13/16 0510 10/15/16 0530 10/16/16 0453  NA 136 137 137 139 140  K 4.3 3.7 3.9 3.7 3.9  CL 102 104 104 108 108  CO2 21* 24 21* 22 22  BUN 21* 16 13 16 17   CREATININE 0.84 0.63 0.69 0.51 0.52  GLUCOSE 105* 109* 99 100* 95  CALCIUM 9.5 8.6* 8.8* 8.5* 8.5*   Lab Results  Component Value Date   INR 1.00 10/11/2016    Examination:  General appearance: alert, cooperative and no distress Extremities: extremities normal,  atraumatic, no cyanosis or edema  Wound Exam: clean, dry, intact   Drainage:  None: wound tissue dry  Motor Exam: Quadriceps and Hamstrings Intact  Sensory Exam: Superficial Peroneal, Deep Peroneal and Tibial normal   Assessment:    3 Days Post-Op  Procedure(s) (LRB): RIGHT HIP PINNING (Right)  ADDITIONAL DIAGNOSIS:  Active Problems:   Closed fracture of right hip (HCC)   Fall at home   Shortness of breath     Plan: Physical Therapy as ordered Weight Bearing as Tolerated (WBAT)  DVT Prophylaxis:  Lovenox  DISCHARGE PLAN: Skilled Nursing Facility/Rehab  DISCHARGE NEEDS: HHPT   Patient continuing to do well and progressing in PT. Is tolerating diet well and is able to ambulate around her room with assistance. Will continue to follow through the weekend.          Donia Ast 10/16/2016, 7:36 AM

## 2016-10-16 NOTE — Progress Notes (Signed)
Discussed Mrs. Pugh's case with her niece, [name], at [phone]. Patient has agreed to go to a SNF, as her home health aids will probably not be enough to help her with transfers. She is currently requiring 2 people for transfers, and at home they have 24 hour care with 1 person for both Mr. And Mrs. Cordial. Family would specifically like [facility], because Mrs. Panzer would like to be geographically close to Mr. Giang. Have reconsulted SW via VM. Updated Rod Holler that Mrs. Ogando is nearing discharge from a medical standpoint.

## 2016-10-16 NOTE — Progress Notes (Signed)
Family Medicine Teaching Service Daily Progress Note Intern Pager: [phone]  Patient name: Helen Odom Medical record number: [id number] Date of birth: July 27, 1931 Age: 82 y.o. Gender: female  Primary Care Provider: Lind Covert, MD Consultants: ortho Code Status: partial - DNI  Pt Overview and Major Events to Date:  12/5 - Admitted for hip fracture after mechanical fall 12/6 Ortho evaluated, planning pinning 12/7am 12/7 - surgical pinning performed, tolerated well 12/8 - pain well controlled, PT recommending SNF  Assessment and Plan: Helen Odom is a 82 y.o. female presenting with R impacted femoral neck fracture after mechanical fall. PMH is significant for osteoporosis (didn't tolerate bisphosphonates or fosamax - GI upset), depression (currently on zoloft), gait abnormality (hx of eval for parkinson's without diagosis), hx of colon cancer.  # R impacted femoral neck fracture: Surgical pinning conducted 12/7 -ortho consulted, appreciate recs: tolerated well, WBAT, continue chemical VTE prophylaxis for 30 days total -regular diet - PT/OT: SNF -pain control per ortho: morphine x1, norco x2, tylenol over the last 24 hours -colace and senokot for constipation in the setting of opioid use, miralax PRN, had BM today  # Hypoxia: patient reports O2 was reapplied while sat was 94%. Spoke with RN team, goal sats >90, attempt to wean O2 today. Removed O2 during my exam.  -incentive spirometry, counseled patient on the importance of this -O2 to maintain sats >90 -duonebs PRN  #Elevated blood pressure: 2 episodes of elevated BP, one on admission and one today. Will not add BP meds today as possibly due to pain, will recheck.  - continue to monitor BP  # Depression: worsening per PCP notes.  -continue home zoloft  # Gait abnormality, tremor:  -PT/OT -fall precautions  # Osteoporosis: patient unable to tolerate fosamax or bisphosphonates per chart review.   -continue  home vitamin D   # Osteoarthritis: Patient reports pain in bilateral knees at baseline, worsened after fall. Takes 2 tylenol at home, reports high pain tolerance. -pain control as above for hip fracture  FEN/GI: NPO after MN Prophylaxis: SCDs and lovenox  Disposition: continued inpatient management, SNF, SW consulted  Subjective:  Patient feels well today. Still wanting to go home rather than SNF. Discussed that she is nearing discharge and we will work with the CM and [place] team to arrange services at home if that is what she desires.   Objective: Temp:  [97.8 F (36.6 C)-99.2 F (37.3 C)] 97.8 F (36.6 C) (12/10 0541) Pulse Rate:  [64-89] 64 (12/10 0541) Resp:  [18] 18 (12/10 0541) BP: (123-141)/(51-75) 141/61 (12/10 0541) SpO2:  [91 %-95 %] 95 % (12/10 0541) Physical Exam: General: NAD. Cardiovascular: RRR, no edema Respiratory: good air movement, crackles in bilateral bases, no wheezing, increased WOB Gastrointestinal: soft, nontender, nondistended, + bowel sounds MSK: Bandage clean and dry over R lateral hip. No evidence of hematoma  Derm: No rashes on visualized skin.  Neuro: AOx4.  Psych: mood and affect appropriate  Laboratory:  Recent Labs Lab 10/13/16 0510 10/15/16 0530 10/16/16 0707  WBC 10.3 7.2 9.1  HGB 10.4* 9.1* 9.9*  HCT 33.3* 28.5* 31.6*  PLT 209 201 232    Recent Labs Lab 10/13/16 0510 10/15/16 0530 10/16/16 0453  NA 137 139 140  K 3.9 3.7 3.9  CL 104 108 108  CO2 21* 22 22  BUN 13 16 17   CREATININE 0.69 0.51 0.52  CALCIUM 8.8* 8.5* 8.5*  GLUCOSE 99 100* 95    Imaging/Diagnostic Tests: No new imaging since  last note.  Helen Hilding, MD 10/16/2016, 10:20 AM PGY-1, [facility] Intern pager: [phone], text pages welcome

## 2016-10-17 ENCOUNTER — Telehealth: Payer: Self-pay | Admitting: *Deleted

## 2016-10-17 ENCOUNTER — Encounter: Payer: Self-pay | Admitting: *Deleted

## 2016-10-17 ENCOUNTER — Encounter (HOSPITAL_COMMUNITY): Payer: Self-pay | Admitting: Orthopedic Surgery

## 2016-10-17 ENCOUNTER — Ambulatory Visit: Payer: Medicare Other | Admitting: Neurology

## 2016-10-17 DIAGNOSIS — D508 Other iron deficiency anemias: Secondary | ICD-10-CM | POA: Diagnosis not present

## 2016-10-17 DIAGNOSIS — G20 Parkinson's disease: Secondary | ICD-10-CM | POA: Diagnosis not present

## 2016-10-17 DIAGNOSIS — Z4789 Encounter for other orthopedic aftercare: Secondary | ICD-10-CM | POA: Diagnosis not present

## 2016-10-17 DIAGNOSIS — R251 Tremor, unspecified: Secondary | ICD-10-CM | POA: Diagnosis not present

## 2016-10-17 DIAGNOSIS — W19XXXD Unspecified fall, subsequent encounter: Secondary | ICD-10-CM | POA: Diagnosis not present

## 2016-10-17 DIAGNOSIS — Z85038 Personal history of other malignant neoplasm of large intestine: Secondary | ICD-10-CM | POA: Diagnosis not present

## 2016-10-17 DIAGNOSIS — M8000XA Age-related osteoporosis with current pathological fracture, unspecified site, initial encounter for fracture: Secondary | ICD-10-CM | POA: Diagnosis not present

## 2016-10-17 DIAGNOSIS — F329 Major depressive disorder, single episode, unspecified: Secondary | ICD-10-CM | POA: Diagnosis not present

## 2016-10-17 DIAGNOSIS — S72009A Fracture of unspecified part of neck of unspecified femur, initial encounter for closed fracture: Secondary | ICD-10-CM | POA: Diagnosis not present

## 2016-10-17 DIAGNOSIS — R1311 Dysphagia, oral phase: Secondary | ICD-10-CM | POA: Diagnosis not present

## 2016-10-17 DIAGNOSIS — C189 Malignant neoplasm of colon, unspecified: Secondary | ICD-10-CM | POA: Diagnosis not present

## 2016-10-17 DIAGNOSIS — Z5181 Encounter for therapeutic drug level monitoring: Secondary | ICD-10-CM | POA: Diagnosis not present

## 2016-10-17 DIAGNOSIS — R0902 Hypoxemia: Secondary | ICD-10-CM | POA: Diagnosis not present

## 2016-10-17 DIAGNOSIS — F330 Major depressive disorder, recurrent, mild: Secondary | ICD-10-CM | POA: Diagnosis not present

## 2016-10-17 DIAGNOSIS — R278 Other lack of coordination: Secondary | ICD-10-CM | POA: Diagnosis not present

## 2016-10-17 DIAGNOSIS — R2689 Other abnormalities of gait and mobility: Secondary | ICD-10-CM | POA: Diagnosis not present

## 2016-10-17 DIAGNOSIS — D518 Other vitamin B12 deficiency anemias: Secondary | ICD-10-CM | POA: Diagnosis not present

## 2016-10-17 DIAGNOSIS — S72041D Displaced fracture of base of neck of right femur, subsequent encounter for closed fracture with routine healing: Secondary | ICD-10-CM | POA: Diagnosis not present

## 2016-10-17 DIAGNOSIS — S72041A Displaced fracture of base of neck of right femur, initial encounter for closed fracture: Secondary | ICD-10-CM | POA: Diagnosis not present

## 2016-10-17 DIAGNOSIS — R531 Weakness: Secondary | ICD-10-CM | POA: Diagnosis not present

## 2016-10-17 DIAGNOSIS — E784 Other hyperlipidemia: Secondary | ICD-10-CM | POA: Diagnosis not present

## 2016-10-17 DIAGNOSIS — J449 Chronic obstructive pulmonary disease, unspecified: Secondary | ICD-10-CM | POA: Diagnosis not present

## 2016-10-17 DIAGNOSIS — S72001D Fracture of unspecified part of neck of right femur, subsequent encounter for closed fracture with routine healing: Secondary | ICD-10-CM | POA: Diagnosis not present

## 2016-10-17 DIAGNOSIS — E538 Deficiency of other specified B group vitamins: Secondary | ICD-10-CM | POA: Diagnosis not present

## 2016-10-17 MED ORDER — ENOXAPARIN SODIUM 40 MG/0.4ML ~~LOC~~ SOLN: 40.0000 mg | SUBCUTANEOUS | 0 refills | Status: DC

## 2016-10-17 MED ORDER — ACETAMINOPHEN 325 MG PO TABS: 650.0000 mg | ORAL_TABLET | Freq: Four times a day (QID) | ORAL | 0 refills | Status: AC | PRN

## 2016-10-17 MED ORDER — DOCUSATE SODIUM 100 MG PO CAPS: 100.0000 mg | ORAL_CAPSULE | Freq: Two times a day (BID) | ORAL | 0 refills | Status: DC

## 2016-10-17 MED ORDER — HYDROCODONE-ACETAMINOPHEN 5-325 MG PO TABS: 1.0000 | ORAL_TABLET | ORAL | 0 refills | Status: DC | PRN

## 2016-10-17 MED ORDER — POLYETHYLENE GLYCOL 3350 17 G PO PACK: 17.0000 g | PACK | Freq: Every day | ORAL | 0 refills | Status: DC | PRN

## 2016-10-17 NOTE — Telephone Encounter (Signed)
Called patient's niece to learn patient has been discharged to [facility] on [address]. Rod Holler will contact our office when patient is discharged from rehab so she can be scheduled with Dr. Erin Hearing for Transitions of Care appt. OK to double book appt per Dr. Erin Hearing. Marland KitchenLD

## 2016-10-17 NOTE — Progress Notes (Signed)
Occupational Therapy Treatment Patient Details Name: Helen Odom MRN: [id number] DOB: [birth year] Today's Date: 10/17/2016    History of present illness Pt is an 82 y.o. female s/p R hip pinning. PMHx: Arthritis, Cancer, Depression, Heart murmur, Hyperlipidemia, Neuropathy, Osteoporosis.   OT comments  Upon entering the room, pt supine in bed with 4/10 c/o soreness and aching in R hip. Pt requiring maximal encouragement to be agreeable to OT intervention this session. Pt declined OOB or EOB activities. Pt engaged in B UE strengthening exercises with level 2 theraband. Pt performed chest pulls, lateral pull downs, bicep curls, and shoulder diagonals x 10 reps with min verbal cues for proper technique. Pt needing rest breaks secondary to decreased endurance with task. OT educated pt on importance of doing exercises on her own for strengthening and endurance. Pt verbalized understanding.    Follow Up Recommendations  SNF    Equipment Recommendations  Other (comment) (defer to next venue of care)    Recommendations for Other Services      Precautions / Restrictions Precautions Precautions: Fall Restrictions Weight Bearing Restrictions: No                              Cognition   Behavior During Therapy: WFL for tasks assessed/performed Overall Cognitive Status: Within Functional Limits for tasks assessed                                    Pertinent Vitals/ Pain       Pain Assessment: 0-10 Pain Score: 4  Pain Location: R hip Pain Descriptors / Indicators: Aching Pain Intervention(s): Monitored during session         Frequency  Min 2X/week        Progress Toward Goals  OT Goals(current goals can now be found in the care plan section)  Progress towards OT goals: Progressing toward goals     Plan Discharge plan needs to be updated    Co-evaluation                 End of Session     Activity Tolerance Patient tolerated treatment  well   Patient Left in bed;with call bell/phone within reach;with bed alarm set;with family/visitor present   Nurse Communication          Time: [id number] OT Time Calculation (min): 24 min  Charges: OT General Charges $OT Visit: 1 Procedure OT Treatments $Therapeutic Exercise: 23-37 mins (24)  Aleksandr Pellow P 10/17/2016, 11:14 AM

## 2016-10-17 NOTE — NC FL2 (Signed)
[place] LEVEL OF CARE SCREENING TOOL     IDENTIFICATION  Patient Name: Helen Odom Birthdate: 31-Dec-1930 Sex: female Admission Date (Current Location): 10/11/2016  [facility] and [place] Number:  Herbalist and Address:  [facility], [place] [address] [place], [place] [address]      Provider Number: [id number]  Attending Physician Name and Address:  Dickie La, MD  Relative Name and Phone Number:       Current Level of Care: Hospital Recommended Level of Care: [place] Prior Approval Number:    Date Approved/Denied:   PASRR Number:    Discharge Plan: SNF    Current Diagnoses: Patient Active Problem List   Diagnosis Date Noted  . Mild episode of recurrent major depressive disorder ([place])   . Dyspnea   . Fall at home   . Closed fracture of right hip ([place]) 10/11/2016  . Obesity 06/23/2016  . Abnormality of gait 05/25/2016  . Left shoulder pain 05/25/2016  . Pedal edema 05/25/2016  . Age-related osteoporosis with current pathological fracture 10/14/2014  . Depression 10/14/2014  . Lumbar spondylosis 10/14/2014  . DJD (degenerative joint disease), multiple sites 10/14/2014  . Tremor 10/14/2014  . Macular hole 10/14/2014    Orientation RESPIRATION BLADDER Height & Weight     Self, Time, Situation, Place  Normal Continent Weight: 169 lb (76.7 kg) Height:  5' (152.4 cm) (11/19/14)  BEHAVIORAL SYMPTOMS/MOOD NEUROLOGICAL BOWEL NUTRITION STATUS      Continent Diet (regular)  AMBULATORY STATUS COMMUNICATION OF NEEDS Skin   Extensive Assist Verbally Normal                       Personal Care Assistance Level of Assistance  Bathing, Dressing Bathing Assistance: Maximum assistance   Dressing Assistance: Maximum assistance     Functional Limitations Info             SPECIAL CARE FACTORS FREQUENCY  OT (By licensed OT)     PT Frequency: 5/wk OT Frequency: 5/wk            Contractures       Additional Factors Info  Code Status, Allergies, Psychotropic Code Status Info: Partial Allergies Info: Flu Virus Vaccine, Penicillins Psychotropic Info: zoloft         Current Medications (10/17/2016):  This is the current hospital active medication list Current Facility-Administered Medications  Medication Dose Route Frequency Provider Last Rate Last Dose  . acetaminophen (TYLENOL) tablet 650 mg  650 mg Oral Q6H PRN Charna Elizabeth Martensen III, PA-C   650 mg at 10/16/16 1040   Or  . acetaminophen (TYLENOL) suppository 650 mg  650 mg Rectal Q6H PRN Charna Elizabeth Martensen III, PA-C      . cholecalciferol (VITAMIN D) tablet 1,000 Units  1,000 Units Oral Daily Sela Hilding, MD   1,000 Units at 10/17/16 0820  . docusate sodium (COLACE) capsule 100 mg  100 mg Oral BID Lind Covert, MD   100 mg at 10/17/16 [id number]  . enoxaparin (LOVENOX) injection 40 mg  40 mg Subcutaneous Q24H Charna Elizabeth Martensen III, PA-C   40 mg at 10/17/16 0818  . feeding supplement (ENSURE ENLIVE) (ENSURE ENLIVE) liquid 237 mL  237 mL Oral BID BM Sela Hilding, MD   237 mL at 10/17/16 0827  . HYDROcodone-acetaminophen (NORCO/VICODIN) 5-325 MG per tablet 1-2 tablet  1-2 tablet Oral Q4H PRN Prudencio Burly III, PA-C   2 tablet  at 10/17/16 0719  . ipratropium-albuterol (DUONEB) 0.5-2.5 (3) MG/3ML nebulizer solution 3 mL  3 mL Nebulization Q6H PRN Steve Rattler, DO      . morphine 4 MG/ML injection 1-2 mg  1-2 mg Intravenous Q2H PRN Sela Hilding, MD   2 mg at 10/15/16 1348  . polyethylene glycol (MIRALAX / GLYCOLAX) packet 17 g  17 g Oral Once Sela Hilding, MD      . polyethylene glycol (MIRALAX / GLYCOLAX) packet 17 g  17 g Oral Daily PRN Sela Hilding, MD      . senna [facility]) tablet 8.6 mg  1 tablet Oral BID Charna Elizabeth Martensen III, PA-C   8.6 mg at 10/17/16 0820  . senna-docusate (Senokot-S) tablet 1 tablet  1 tablet Oral QHS PRN Sela Hilding, MD      . sertraline  (ZOLOFT) tablet 50 mg  50 mg Oral Daily Sela Hilding, MD   50 mg at 10/17/16 0820  . vitamin B-12 (CYANOCOBALAMIN) tablet 1,000 mcg  1,000 mcg Oral Daily Sela Hilding, MD   1,000 mcg at 10/17/16 0820     Discharge Medications: Please see discharge summary for a list of discharge medications.  Relevant Imaging Results:  Relevant Lab Results:   Additional Information SS#: [id number]  Jorge Ny, LCSW

## 2016-10-17 NOTE — Telephone Encounter (Signed)
Called Patient's emergency contact, [name] (niece), at patient request to discuss hospital encounter. Rod Holler states she is concerned with varying degrees of patient's private aids to help when patient is discharged. Has been cautiously hopeful with patient in discussing her ability to go home vs rehab. States patient was very tearful earlier in the day. Helps patient with finances and learned patient had asked someone to withdraw large sum of money from her acct and bring to hospital. That transaction was cancelled. Rod Holler made aware that I will be contacting patient within 2 days of discharge [facility] or SNF) and scheduling appt with PCP within 7 days. Rod Holler expressed appreciation for call and close f/u on patient.  Hubbard Hartshorn, RN, BSN

## 2016-10-17 NOTE — Progress Notes (Signed)
Patient will discharge to [facility] Anticipated discharge date: 12/11 Family notified: Rod Holler Transportation by Corey Harold- scheduled for 2pm Report #: [phone]  [place] signing off.  Jorge Ny, LCSW Clinical Social Worker [phone]

## 2016-10-17 NOTE — Clinical Social Work Note (Signed)
Clinical Social Work Assessment  Patient Details  Name: Helen Odom MRN: [id number] Date of Birth: 1931-07-09  Date of referral:  10/17/16               Reason for consult:  Facility Placement                Permission sought to share information with:  Family Supports Permission granted to share information::  Yes, Verbal Permission Granted  Name::     IT consultant::     Relationship::  niece  Contact Information:     Housing/Transportation Living arrangements for the past 2 months:  Apartment Source of Information:  Patient, Other (Comment Required) (niece) Patient Interpreter Needed:  None Criminal Activity/Legal Involvement Pertinent to Current Situation/Hospitalization:  No - Comment as needed Significant Relationships:  Spouse, Other Family Members Lives with:  Spouse Do you feel safe going back to the place where you live?  No Need for family participation in patient care:  No (Coment)  Care giving concerns:  Pt lives with husband who has severe dementia and unable to provide needed level of assistance.   Social Worker assessment / plan:  CSW informed by MD that now patient is willing to go SNF and prefers [facility].  CSW confirmed this change in plan with patient and niece.  Employment status:  Retired Forensic scientist:  Medicare PT Recommendations:  [place] / Referral to community resources:  [place]  Patient/Family's Response to care:  Agreeable to SNF placement- understands home would not be safe for her with current impairment.  Patient/Family's Understanding of and Emotional Response to Diagnosis, Current Treatment, and Prognosis:  No questions or concerns- hopeful she will be well enough to return home to husband too.    Emotional Assessment Appearance:  Appears stated age Attitude/Demeanor/Rapport:    Affect (typically observed):  Appropriate Orientation:  Oriented to Situation, Oriented to  Time,  Oriented to Place, Oriented to Self Alcohol / Substance use:  Not Applicable Psych involvement (Current and /or in the community):  No (Comment)  Discharge Needs  Concerns to be addressed:  Care Coordination Readmission within the last 30 days:  No Current discharge risk:  Physical Impairment Barriers to Discharge:  Continued Medical Work up   Jorge Ny, LCSW 10/17/2016, 10:30 AM

## 2016-10-17 NOTE — Progress Notes (Addendum)
Nsg Discharge Note  Admit Date:  10/11/2016 Discharge date: 10/17/2016   Gwenette Greet to be D/C'd Skilled nursing facility per MD order.  AVS completed.  Copy for chart, and copy for patient signed, and dated. Patient/caregiver able to verbalize understanding.  Discharge Medication:   Medication List    STOP taking these medications   aspirin 81 MG tablet   hydrocortisone 2.5 % ointment   ibuprofen 200 MG tablet Commonly known as:  ADVIL,MOTRIN     TAKE these medications   acetaminophen 325 MG tablet Commonly known as:  TYLENOL Take 2 tablets (650 mg total) by mouth every 6 (six) hours as needed for mild pain (or Fever >/= 101). What changed:  medication strength  how much to take  when to take this  reasons to take this   cholecalciferol 1000 units tablet Commonly known as:  VITAMIN D Take 1,000 Units by mouth daily.   docusate sodium 100 MG capsule Commonly known as:  COLACE Take 1 capsule (100 mg total) by mouth 2 (two) times daily.   docusate sodium 100 MG capsule Commonly known as:  COLACE Take 1 capsule (100 mg total) by mouth 2 (two) times daily.   enoxaparin 40 MG/0.4ML injection Commonly known as:  LOVENOX Inject 0.4 mLs (40 mg total) into the skin daily. For 30 days post op for DVT prophylaxis   enoxaparin 40 MG/0.4ML injection Commonly known as:  LOVENOX Inject 0.4 mLs (40 mg total) into the skin daily. Start taking on:  10/18/2016   HYDROcodone-acetaminophen 5-325 MG tablet Commonly known as:  NORCO Take 1-2 tablets by mouth every 6 (six) hours as needed for moderate pain.   HYDROcodone-acetaminophen 5-325 MG tablet Commonly known as:  NORCO/VICODIN Take 1-2 tablets by mouth every 4 (four) hours as needed (breakthrough pain).   polyethylene glycol packet Commonly known as:  MIRALAX / GLYCOLAX Take 17 g by mouth daily as needed for moderate constipation.   sertraline 25 MG tablet Commonly known as:  ZOLOFT Take 1 tablet (25 mg total)  by mouth daily. May increase to two tabs after one week What changed:  how much to take  additional instructions   vitamin B-12 1000 MCG tablet Commonly known as:  CYANOCOBALAMIN Take 1,000 mcg by mouth daily.       Discharge Assessment: Vitals:   10/17/16 0608 10/17/16 0750  BP: (!) 172/76 (!) 169/70  Pulse: 85 82  Resp: 20   Temp: 98.1 F (36.7 C)    Skin clean, dry and intact without evidence of skin break down, no evidence of skin tears noted. IV catheter discontinued intact. Site without signs and symptoms of complications - no redness or edema noted at insertion site, patient denies c/o pain - only slight tenderness at site.  Dressing with slight pressure applied.  D/c Instructions-Education: Discharge instructions given to patient/family with verbalized understanding. D/c education completed with patient/family including follow up instructions, medication list, d/c activities limitations if indicated, with other d/c instructions as indicated by MD - patient able to verbalize understanding, all questions fully answered. Patient instructed to return to ED, call 911, or call MD for any changes in condition.  Patient to transport via [facility] scheduled at 1400.  [facility] called at [phone] to give report.  Salley Slaughter, RN MSN 10/17/2016 1:56 PM

## 2016-10-17 NOTE — Progress Notes (Unsigned)
Transitions of Care Hospital Encounter Note:       Admit date: 10/11/2016 Best patient contact number: [phone]  Emergency contact(s): [name] (niece) [phone] Alternate number: None PCP: [facility]  Principal Diagnosis: Right hip fracture  Reason for Chronic Case Management: Co-morbidities as follows:  Osteoporosis, depression, gait abnormality  Met with patient to discuss Transitions of Care. PT was finishing with patient as I entered. Discussed concerns for patient discharging to home vs rehab facility for daily PT. Patient preference is to go home to be with husband. States she has round-the-clock private aids. Patient ambulated with walker prior to hip fracture. States she lives in one level apt [employer]) that is accessed by Media planner. No outside steps. States it's 30 ft from hall bath to living room and 10 feet from bedroom. Was ambulating this distance with walker prior to admission. Has grab bars and shower chair in bath. Patient reported being teary earlier in the day but appears in good spirits at present. Patient states she is able to afford all her medications; uses [facility] which is next door to her apt. Denies transportation needs or food insecurity.   Patient aware that this RN will be calling her within 2 days of discharge [facility] or SNF).  HFU/TOC appt: TBD   Identified Barriers:Patient need for daily PT/OT  Patient asked this RN to contact her niece Rod Holler to discuss our conversation. Will call when return to office. Hubbard Hartshorn, RN, BSN

## 2016-10-18 DIAGNOSIS — F329 Major depressive disorder, single episode, unspecified: Secondary | ICD-10-CM | POA: Diagnosis not present

## 2016-10-18 DIAGNOSIS — S72001D Fracture of unspecified part of neck of right femur, subsequent encounter for closed fracture with routine healing: Secondary | ICD-10-CM | POA: Diagnosis not present

## 2016-10-18 DIAGNOSIS — C189 Malignant neoplasm of colon, unspecified: Secondary | ICD-10-CM | POA: Diagnosis not present

## 2016-10-18 DIAGNOSIS — W19XXXD Unspecified fall, subsequent encounter: Secondary | ICD-10-CM | POA: Diagnosis not present

## 2016-10-19 ENCOUNTER — Inpatient Hospital Stay: Payer: Medicare Other | Admitting: Family Medicine

## 2016-10-19 DIAGNOSIS — D508 Other iron deficiency anemias: Secondary | ICD-10-CM | POA: Diagnosis not present

## 2016-10-19 DIAGNOSIS — Z85038 Personal history of other malignant neoplasm of large intestine: Secondary | ICD-10-CM | POA: Diagnosis not present

## 2016-10-19 DIAGNOSIS — S72041A Displaced fracture of base of neck of right femur, initial encounter for closed fracture: Secondary | ICD-10-CM | POA: Diagnosis not present

## 2016-10-19 DIAGNOSIS — F329 Major depressive disorder, single episode, unspecified: Secondary | ICD-10-CM | POA: Diagnosis not present

## 2016-10-19 DIAGNOSIS — R0902 Hypoxemia: Secondary | ICD-10-CM | POA: Diagnosis not present

## 2016-10-19 DIAGNOSIS — E538 Deficiency of other specified B group vitamins: Secondary | ICD-10-CM | POA: Diagnosis not present

## 2016-10-19 DIAGNOSIS — E784 Other hyperlipidemia: Secondary | ICD-10-CM | POA: Diagnosis not present

## 2016-10-19 DIAGNOSIS — J449 Chronic obstructive pulmonary disease, unspecified: Secondary | ICD-10-CM | POA: Diagnosis not present

## 2016-10-26 DIAGNOSIS — S72001D Fracture of unspecified part of neck of right femur, subsequent encounter for closed fracture with routine healing: Secondary | ICD-10-CM | POA: Diagnosis not present

## 2016-10-26 DIAGNOSIS — E784 Other hyperlipidemia: Secondary | ICD-10-CM | POA: Diagnosis not present

## 2016-10-26 DIAGNOSIS — W19XXXD Unspecified fall, subsequent encounter: Secondary | ICD-10-CM | POA: Diagnosis not present

## 2016-10-26 DIAGNOSIS — C189 Malignant neoplasm of colon, unspecified: Secondary | ICD-10-CM | POA: Diagnosis not present

## 2016-10-28 DIAGNOSIS — S72001D Fracture of unspecified part of neck of right femur, subsequent encounter for closed fracture with routine healing: Secondary | ICD-10-CM | POA: Diagnosis not present

## 2016-11-17 DIAGNOSIS — S72041D Displaced fracture of base of neck of right femur, subsequent encounter for closed fracture with routine healing: Secondary | ICD-10-CM | POA: Diagnosis not present

## 2016-11-17 DIAGNOSIS — J449 Chronic obstructive pulmonary disease, unspecified: Secondary | ICD-10-CM | POA: Diagnosis not present

## 2016-11-17 DIAGNOSIS — Z85038 Personal history of other malignant neoplasm of large intestine: Secondary | ICD-10-CM | POA: Diagnosis not present

## 2016-11-17 DIAGNOSIS — D518 Other vitamin B12 deficiency anemias: Secondary | ICD-10-CM | POA: Diagnosis not present

## 2016-11-17 DIAGNOSIS — R251 Tremor, unspecified: Secondary | ICD-10-CM | POA: Diagnosis not present

## 2016-11-17 DIAGNOSIS — E784 Other hyperlipidemia: Secondary | ICD-10-CM | POA: Diagnosis not present

## 2016-11-17 DIAGNOSIS — F329 Major depressive disorder, single episode, unspecified: Secondary | ICD-10-CM | POA: Diagnosis not present

## 2016-11-17 DIAGNOSIS — D508 Other iron deficiency anemias: Secondary | ICD-10-CM | POA: Diagnosis not present

## 2016-11-21 ENCOUNTER — Ambulatory Visit (INDEPENDENT_AMBULATORY_CARE_PROVIDER_SITE_OTHER): Payer: Medicare Other | Admitting: Neurology

## 2016-11-21 ENCOUNTER — Encounter: Payer: Self-pay | Admitting: Neurology

## 2016-11-21 ENCOUNTER — Telehealth: Payer: Self-pay | Admitting: Neurology

## 2016-11-21 VITALS — BP 142/82 | HR 75 | Ht 60.0 in

## 2016-11-21 DIAGNOSIS — G20 Parkinson's disease: Secondary | ICD-10-CM

## 2016-11-21 MED ORDER — CARBIDOPA-LEVODOPA 25-100 MG PO TABS: 0.5000 | ORAL_TABLET | Freq: Three times a day (TID) | ORAL | 6 refills | Status: DC

## 2016-11-21 MED ORDER — CARBIDOPA-LEVODOPA 25-100 MG PO TABS: 0.5000 | ORAL_TABLET | Freq: Two times a day (BID) | ORAL | 6 refills | Status: DC

## 2016-11-21 NOTE — Telephone Encounter (Signed)
Per AA,MD rx instructions, it should be  carbidopa-levodopa (SINEMET IR) 25-100 MG tablet Take 0.5 tablets by mouth 2 (two) times daily.   I tried calling pharmacy x5. Unable to reach associates. Kept looping back to automated message

## 2016-11-21 NOTE — Telephone Encounter (Signed)
Jennifer/[employer] needs clarification for carbidopa-levodopa (SINEMET IR) 25-100 MG tablet . Is it 1/2 bid or 1/2 tid.

## 2016-11-21 NOTE — Telephone Encounter (Signed)
Tried calling pharmacy again. Automated message states they are closed for lunch. Will try again later

## 2016-11-21 NOTE — Patient Instructions (Addendum)
Remember to drink plenty of fluid, eat healthy meals and do not skip any meals. Try to eat protein with a every meal and eat a healthy snack such as fruit or nuts in between meals. Try to keep a regular sleep-wake schedule and try to exercise daily, particularly in the form of walking, 20-30 minutes a day, if you can.   As far as your medications are concerned, I would like to suggest: Carbidopa/Levodopa 1/2 tablet twice a day  -Try to separate Sinemet from food (especially protein-rich foods like meat, dairy, eggs) by about 30-60 mins - this will help the absorption of the medication. If you have some nausea with the medication, you can take it with some light food like crackers or ginger ale   I would like to see you back in 3 months, sooner if we need to. Please call us with any interim questions, concerns, problems, updates or refill requests.   Our phone number is [phone]. We also have an after hours call service for urgent matters and there is a physician on-call for urgent questions. For any emergencies you know to call 911 or go to the nearest emergency room  Essential Tremor Introduction A tremor is trembling or shaking that you cannot control. Most tremors affect the hands or arms. Tremors can also affect the head, vocal cords, face, and other parts of the body. Essential tremor is a tremor without a known cause. What are the causes? Essential tremor has no known cause. What increases the risk? You may be at greater risk of essential tremor if:  You have a family member with essential tremor.  You are age 68 or older.  You take certain medicines. What are the signs or symptoms? The main sign of a tremor is uncontrolled and unintentional rhythmic shaking of a body part.  You may have difficulty eating with a spoon or fork.  You may have difficulty writing.  You may nod your head up and down or side to side.  You may have a quivering voice. Your tremors:  May get  worse over time.  May come and go.  May be more noticeable on one side of your body.  May get worse due to stress, fatigue, caffeine, and extreme heat or cold. How is this diagnosed? Your health care provider can diagnose essential tremor based on your symptoms, medical history, and a physical examination. There is no single test to diagnose an essential tremor. However, your health care provider may perform a variety of tests to rule out other conditions. Tests may include:  Blood and urine tests.  Imaging studies of your brain, such as:  CT scan.  MRI.  A test that measures involuntary muscle movement (electromyogram). How is this treated? Your tremors may go away without treatment. Mild tremors may not need treatment if they do not affect your day-to-day life. Severe tremors may need to be treated using one or a combination of the following options:  Medicines. This may include medicine that is injected.  Lifestyle changes.  Physical therapy. Follow these instructions at home:  Take medicines only as directed by your health care provider.  Limit alcohol intake to no more than 1 drink per day for nonpregnant women and 2 drinks per day for men. One drink equals 12 oz of beer, 5 oz of wine, or 1 oz of hard liquor.  Do not use any tobacco products, including cigarettes, chewing tobacco, or electronic cigarettes. If you need help quitting, ask your health  care provider.  Take medicines only as directed by your health care provider.  Avoid extreme heat or cold.  Limit the amount of caffeine you consumeas directed by your health care provider.  Try to get eight hours of sleep each night.  Find ways to manage your stress, such as meditation or yoga.  Keep all follow-up visits as directed by your health care provider. This is important. This includes any physical therapy visits. Contact a health care provider if:  You experience any changes in the location or intensity of  your tremors.  You start having a tremor after starting a new medicine.  You have tremor with other symptoms such as:  Numbness.  Tingling.  Pain.  Weakness.  Your tremor gets worse.  Your tremor interferes with your daily life. This information is not intended to replace advice given to you by your health care provider. Make sure you discuss any questions you have with your health care provider. Document Released: 11/14/2014 Document Revised: 03/31/2016 Document Reviewed: 04/21/2014  2017 Elsevier Carbidopa; Levodopa tablets What is this medicine? CARBIDOPA;LEVODOPA (kar bi DOE pa; lee voe DOE pa) is used to treat the symptoms of Parkinson's disease. This medicine may be used for other purposes; ask your health care provider or pharmacist if you have questions. COMMON BRAND NAME(S): Atamet, SINEMET What should I tell my health care provider before I take this medicine? They need to know if you have any of these conditions: -asthma or lung disease -depression or other mental illness -diabetes -glaucoma -heart disease, including history of a heart attack -irregular heart beat -kidney or liver disease -melanoma or suspicious skin lesions -stomach or intestine ulcers -an unusual or allergic reaction to levodopa, carbidopa, other medicines, foods, dyes, or preservatives -pregnant or trying to get pregnant -breast-feeding How should I use this medicine? Take this medicine by mouth with a glass of water. Follow the directions on the prescription label. Take your doses at regular intervals. Do not take your medicine more often than directed. Do not stop taking except on the advice of your doctor or health care professional. Talk to your pediatrician regarding the use of this medicine in children. Special care may be needed. Overdosage: If you think you have taken too much of this medicine contact a poison control center or emergency room at once. NOTE: This medicine is only for  you. Do not share this medicine with others. What if I miss a dose? If you miss a dose, take it as soon as you can. If it is almost time for your next dose, take only that dose. Do not take double or extra doses. What may interact with this medicine? Do not take this medicine with any of the following medications: -MAOIs like Marplan, Nardil, and Parnate -reserpine -tetrabenazine This medicine may also interact with the following medications: -alcohol -droperidol -entacapone -iron supplements or multivitamins with iron -isoniazid, INH -linezolid -medicines for depression, anxiety, or psychotic disturbances -medicines for high blood pressure -medicines for sleep -metoclopramide -papaverine -procarbazine -tedizolid -rasagiline -selegiline -tolcapone This list may not describe all possible interactions. Give your health care provider a list of all the medicines, herbs, non-prescription drugs, or dietary supplements you use. Also tell them if you smoke, drink alcohol, or use illegal drugs. Some items may interact with your medicine. What should I watch for while using this medicine? Visit your doctor or health care professional for regular checks on your progress. It may be several weeks or months before you feel the full  benefits of this medicine. Continue to take your medicine on a regular schedule. Do not take any additional medicines for Parkinson's disease without first consulting with your health care provider. You may experience a wearing of effect prior to the time for your next dose of this medicine. You may also experience an on-off effect where the medicine apparently stops working for anything from a minute to several hours, then suddenly starts working again. Tell your doctor or health care professional if any of these symptoms happen to you. Your dose may need to be changed. A high protein diet can slow or prevent absorption of this medicine. Avoid high protein foods near the  time of taking this medicine to help to prevent these problems. Take this medicine at least 30 minutes before eating or one hour after meals. You may want to eat higher protein foods later in the day or in small amounts. Discuss your diet with your doctor or health care professional or nutritionist. You may get drowsy or dizzy. Do not drive, use machinery, or do anything that needs mental alertness until you know how this drug affects you. Do not stand or sit up quickly, especially if you are an older patient. This reduces the risk of dizzy or fainting spells. Alcohol can make you more drowsy and dizzy. Avoid alcoholic drinks. If you find that you have sudden feelings of wanting to sleep during normal activities, like cooking, watching television, or while driving or riding in a car, you should contact your health care professional. If you are diabetic, this medicine may interfere with the accuracy of some tests for sugar or ketones in the urine (does not interfere with blood tests). Check with your doctor or health care professional before changing the dose of your diabetic medicine. This medicine may discolor the urine or sweat, making it look darker or red in color. This is of no cause for concern. However, this may stain clothing or fabrics. There have been reports of increased sexual urges or other strong urges such as gambling while taking some medicines for Parkinson's disease. If you experience any of these urges while taking this medicine, you should report it to your health care provider as soon as possible. You should check your skin often for changes to moles and new growths while taking this medicine. Call your doctor if you notice any of these changes. What side effects may I notice from receiving this medicine? Side effects that you should report to your doctor or health care professional as soon as possible: -allergic reactions like skin rash, itching or hives, swelling of the face, lips, or  tongue -anxiety, confusion, or nervousness -falling asleep during normal activities like driving -fast, irregular heartbeat -hallucination, loss of contact with reality -mood changes like aggressive behavior, depression -stomach pain -trouble passing urine -uncontrolled movements of the mouth, head, hands, feet, shoulders, eyelids or other unusual muscle movements Side effects that usually do not require medical attention (report to your doctor or health care professional if they continue or are bothersome): -headache -loss of appetite -muscle twitches -nausea, vomiting -nightmares, trouble sleeping -unusually weak or tired This list may not describe all possible side effects. Call your doctor for medical advice about side effects. You may report side effects to FDA at 1-800-FDA-1088. Where should I keep my medicine? Keep out of the reach of children. Store at room temperature between 15 and 30 degrees C (59 and 86 degrees F). Protect from light. Throw away any unused medicine after the  expiration date. NOTE: This sheet is a summary. It may not cover all possible information. If you have questions about this medicine, talk to your doctor, pharmacist, or health care provider.  2017 Elsevier/Gold Standard (2013-12-24 15:41:53)

## 2016-11-21 NOTE — Progress Notes (Addendum)
Helen Odom    Provider:  Dr Jaynee Eagles Referring Provider: Lind Covert, * Primary Care Physician:  Lind Covert, MD   Interval history 11/19/2014: Helen Odom is here with her aunt Helen Odom. Patient has been diagnosed with parkinsonism but has had a tremor for 5 years in arms and both legs more consistent with essential tremor. She reports she has "foot jumping" of the right foot which is helped by Norco pain medication. She has tremors in the hands which is not at rest but is worse with eating and trying to get food to her mouth. Walking is difficult, her left leg doesn't seem to "listen to her". She had a fall in December. She says her knee gave way in the restroom and her leg gave out and she fell. The right leg gave way. She has terrible arthritis which also impacts her walking. She is in a wheelchair and can't walk currently since her fall.  Her mother had a tremor as well. The tremor impacts her eating and it gets to the point that it is too much trouble to eat. She is spilling things due to the tremor. No significant resting tremor. More of an action tremor in the arms. She can stop the leg shaking voluntarily. Memory is fine, denies any memory issues. She has difficulty walking, her voice is softer, walking slower and recently she can't walk without significant assistance and has some stooping of her posture. Her left leg is more affected with movement, it does not respond to her brain per patient. Niece provides much information  Reviewed previous records:  HISTORY OF PRESENT ILLNESS, Dr. Felecia Shelling 11/19/2014 :  I had the pleasure seeing you patient, Helen Odom, at [facility] for a neurologic consultation regarding her parkinsonism. She recently moved to the [place] area with her husband. About a year and a half ago, she noted tremor in the right leg and the left greater than right arm. She went to a neurologist who diagnosed her  with parkinsonism.  He prescribed carbidopa levodopa. However, she took only a couple doses as it made her very nauseous. No other medications were tried. She noted mild weakness in her legs at that time. She feels her arms are not as weak as the legs but they tire out easily. The tremor in her leg is variable, often depending on how she is sitting (worse when ankle is more extended).  It is less likely to occur while standing.   It is always worse on her right and sometimes just present on the right.  The tremor in the arms (left worse than right) are different than the tremors in the legs.   The hand tremors are worse when she tries to hold still (like writing and eating).    They are less likely to occur at rest.     The weakness in her legs has increased and she feels weaker over the past few months. She is having a lot of difficulty getting out of chairs.   The tremors have also worsened. Her gait has progressively worsened over the last couple of years. Specifically, her stride has become shorter and her balance is more unsteady. She is very careful so she does not fall over the past couple of months she has had more difficulty getting out of a chair and how her caregivers help her stand. When she stands she is able to walk but with a small stride. For longer distances she uses a wheelchair.  She denies any bladder or bowel changes. She has not had any incontinence.  Her mother had a tremor at times when she was older   Social History   Social History  . Marital status: Married    Spouse name: N/A  . Number of children: N/A  . Years of education: N/A   Occupational History  . Not on file.   Social History Main Topics  . Smoking status: Never Smoker  . Smokeless tobacco: Never Used  . Alcohol use No     Comment: occasional  . Drug use: No  . Sexual activity: No   Other Topics Concern  . Not on file   Social History Narrative   Lives with her husband who has moderate Alz.    They are in an apartment with 24 hour assistance.   Her niece [name] is her most local relation.  She is in IT at [employer].      She lives at [place] right now. Unsure how long she will be there for per patient/niece   Fax: [phone]    Family History  Problem Relation Age of Onset  . Congestive Heart Failure Mother     Deceased at 64 yo  . CAD Father   . Heart attack Father 9    Deceased  . Atrial fibrillation Brother     Past Medical History:  Diagnosis Date  . Arthritis   . Cancer ([place])   . Colon cancer ([place])   . Depression   . Heart murmur   . Hyperlipidemia   . Movement disorder   . Neuropathy ([place])   . Osteoporosis   . Vision abnormalities     Past Surgical History:  Procedure Laterality Date  . CATARACT EXTRACTION, BILATERAL Bilateral 2015  . CHOLECYSTECTOMY    . COLON RESECTION    . HIP PINNING,CANNULATED Right 10/13/2016   Procedure: RIGHT HIP PINNING;  Surgeon: Renette Butters, MD;  Location: [facility];  Service: Orthopedics;  Laterality: Right;    Current Outpatient Prescriptions  Medication Sig Dispense Refill  . acetaminophen (TYLENOL) 325 MG tablet Take 2 tablets (650 mg total) by mouth every 6 (six) hours as needed for mild pain (or Fever >/= 101). 30 tablet 0  . cholecalciferol (VITAMIN D) 1000 UNITS tablet Take 1,000 Units by mouth daily.    Marland Kitchen docusate sodium (COLACE) 100 MG capsule Take 1 capsule (100 mg total) by mouth 2 (two) times daily. 60 capsule 0  . docusate sodium (COLACE) 100 MG capsule Take 1 capsule (100 mg total) by mouth 2 (two) times daily. 10 capsule 0  . enoxaparin (LOVENOX) 40 MG/0.4ML injection Inject 0.4 mLs (40 mg total) into the skin daily. 1 Syringe 0  . HYDROcodone-acetaminophen (NORCO) 5-325 MG tablet Take 1-2 tablets by mouth every 6 (six) hours as needed for moderate pain. 40 tablet 0  . HYDROcodone-acetaminophen (NORCO/VICODIN) 5-325 MG tablet Take 1-2 tablets by mouth every 4 (four) hours as needed  (breakthrough pain). 30 tablet 0  . sertraline (ZOLOFT) 25 MG tablet Take 1 tablet (25 mg total) by mouth daily. May increase to two tabs after one week (Patient taking differently: Take 50 mg by mouth daily. May increase to two tabs after one week) 60 tablet 1  . vitamin B-12 (CYANOCOBALAMIN) 1000 MCG tablet Take 1,000 mcg by mouth daily.    . carbidopa-levodopa (SINEMET IR) 25-100 MG tablet Take 0.5 tablets by mouth 2 (two) times daily. 30 tablet 6  . polyethylene glycol (  MIRALAX / GLYCOLAX) packet Take 17 g by mouth daily as needed for moderate constipation. (Patient not taking: Reported on 11/21/2016) 14 each 0   No current facility-administered medications for this visit.     Allergies as of 11/21/2016 - Review Complete 11/21/2016  Allergen Reaction Noted  . Flu virus vaccine Swelling 10/14/2014  . Penicillins Rash 10/14/2014    Vitals: BP (!) 142/82 (BP Location: Right Arm, Patient Position: Sitting, Cuff Size: Normal)   Pulse 75   Ht 5' (1.524 m)   LMP 10/14/1988   SpO2 96%  Last Weight:  Wt Readings from Last 1 Encounters:  10/12/16 169 lb (76.7 kg)   Last Height:   Ht Readings from Last 1 Encounters:  11/21/16 5' (1.524 m)   General: The patient is well-developed and well-nourished and in no acute distress  Eyes:  Funduscopic exam shows normal optic discs and retinal vessels.  Neck: The neck is supple, no carotid bruits are noted.  The neck is nontender. Range of motion of the neck is limited to about 20 rotation to either side. Flexion and extension is just slightly limited.  Skin: Extremities are without significant edema but the left ankle is asymmetric due to previous injury  Neurologic Exam  Mental status: The patient is alert and oriented x 3 at the time of the examination. The patient has apparent normal recent and remote memory, with an apparently normal attention span and concentration ability.   Speech is normal.  Cranial nerves: Hypomimia,  Extraocular movements are full. Pupils are equal, round, and reactive to light and accomodation.  Visual fields are full.  Facial symmetry is present. There is good facial sensation to soft touch bilaterally.Facial strength is normal.  Trapezius and sternocleidomastoid strength is normal. No dysarthria is noted.  The tongue is midline, and the patient has symmetric elevation of the soft palate. No obvious hearing deficits are noted.  Motor:  She has a 7 Hz essential tremor in both hands that is intensified by sustained posture. Muscle bulk is normal. Tone is slightly increased in the upper extremities and right leg. The left leg appears to have more normal tone. There is bilateral hip flexion weakness otherwise strength appears symmetrical and intact.  Sensory: Sensory testing is intact to pinprick, soft touch, vibration sensation, and position sense on all 4 extremities.  Coordination: Cerebellar testing reveals good finger-nose-finger but reduced heel-to-shin bilaterally.  Gait and station: She cannot stand without significant assistance due to recent fall and leg injury  Reflexes: Deep tendon reflexes increased in her legs and she has clonus at the right ankle. There is spread from the right knee. Reflexes in the arms are brisk without spread. Plantar responses are normal.    Assessment/Plan:    Chamari Statum is an 83 year old woman with a PMHx of Parkinsonism as well as essential tremor in the hands. No resting tremor, not consistent with Parkinson's Disease.  Exam shows clonus in the right leg, increased tone in the upper extremities with cogwheeling in the left arm, hypomimia and a mild essential tremor in her hands.  I agree that the the clonus and increased tone may be due to a spinal cord process such as cervical spondylosis with myelopathy or to a stroke or other lesion in the brain vs Parkinsonism but patient has refused imaging in the past. Tremor in the hands appears to be benign  essential tremor. It is disturbing her ability to eat. She is extremely claustrophobic and refuses to do an MRI or  CT. We can trial Sinemet very low dose but she may have tried it in the past without benefit, stop for any side effects. We could afterwards trial an essential tremor medication, Dr. Felecia Shelling had prescribed a benzodiazepine but she doen't remember if it helped.   As far as your medications are concerned, I would like to suggest: Carbidopa/Levodopa 25/100, 1/2 tablet twice a day, 8 hours apart.  -Try to separate Sinemet from food (especially protein-rich foods like meat, dairy, eggs) by about 30-60 mins - this will help the absorption of the medication. If you have some nausea with the medication, you can take it with some light food like crackers or ginger ale   Cc: Lind Covert, MD    Sarina Ill, MD  [facility] [address] [place], [place] [address]  Phone [phone] Fax [phone]  A total of 40 minutes was spent face-to-face with this patient. Over half this time was spent on counseling patient on the Parkinsonism, increased tone, essential tremor diagnosis and different diagnostic and therapeutic options available.

## 2016-11-21 NOTE — Telephone Encounter (Signed)
Called pharmacy again. Spoke to [place]. Relayed instructions below per AA,MD rx. She verbalized understanding.

## 2016-11-25 ENCOUNTER — Encounter: Payer: Self-pay | Admitting: *Deleted

## 2016-11-25 NOTE — Progress Notes (Signed)
Faxed patient's recent office note to [facility] per AA,MD request. Fax: [phone]. Received confirmation.

## 2016-11-29 ENCOUNTER — Telehealth: Payer: Self-pay | Admitting: Family Medicine

## 2016-11-29 ENCOUNTER — Telehealth: Payer: Self-pay | Admitting: *Deleted

## 2016-11-29 ENCOUNTER — Encounter: Payer: Self-pay | Admitting: Neurology

## 2016-11-29 DIAGNOSIS — C189 Malignant neoplasm of colon, unspecified: Secondary | ICD-10-CM | POA: Diagnosis not present

## 2016-11-29 DIAGNOSIS — F329 Major depressive disorder, single episode, unspecified: Secondary | ICD-10-CM | POA: Diagnosis not present

## 2016-11-29 DIAGNOSIS — M1991 Primary osteoarthritis, unspecified site: Secondary | ICD-10-CM | POA: Diagnosis not present

## 2016-11-29 DIAGNOSIS — M47896 Other spondylosis, lumbar region: Secondary | ICD-10-CM | POA: Diagnosis not present

## 2016-11-29 DIAGNOSIS — Z9181 History of falling: Secondary | ICD-10-CM | POA: Diagnosis not present

## 2016-11-29 DIAGNOSIS — S72001D Fracture of unspecified part of neck of right femur, subsequent encounter for closed fracture with routine healing: Secondary | ICD-10-CM | POA: Diagnosis not present

## 2016-11-29 DIAGNOSIS — E785 Hyperlipidemia, unspecified: Secondary | ICD-10-CM | POA: Diagnosis not present

## 2016-11-29 DIAGNOSIS — H35349 Macular cyst, hole, or pseudohole, unspecified eye: Secondary | ICD-10-CM | POA: Diagnosis not present

## 2016-11-29 DIAGNOSIS — Z7982 Long term (current) use of aspirin: Secondary | ICD-10-CM | POA: Diagnosis not present

## 2016-11-29 DIAGNOSIS — M810 Age-related osteoporosis without current pathological fracture: Secondary | ICD-10-CM | POA: Diagnosis not present

## 2016-11-29 NOTE — Telephone Encounter (Signed)
Tried to call patient but no answer.  Will forward to Lyman Bishop, RN who has been tracking patient's visit since her hospitalization. Jazmin Hartsell,CMA

## 2016-11-29 NOTE — Telephone Encounter (Signed)
If she feels well and does not have questions then ok to wait. If she does not feel well she should come in. Tell her sorry [place] of the [place] and can't call. Thanks!!

## 2016-11-29 NOTE — Telephone Encounter (Signed)
Will forward to MD to advise. Patient is schedule for 12/21/16 currently. Dorothie Wah,CMA

## 2016-11-29 NOTE — Telephone Encounter (Signed)
Pramod, Physical Therapist with [facility] called to request verbal order for physical therapy.  Physical therapy twice a week for total of 8 weeks. Please call [phone].  Derl Barrow, RN

## 2016-11-29 NOTE — Telephone Encounter (Signed)
Transitional Care Post-Discharge Follow-Up Phone Call:   Admit date: 10/17/2016 Discharge date: 11/27/2016  Discharge Disposition: Home  Best patient contact number: [phone] Emergency contact(s): [name] (Niece) [phone] PCP: Erin Hearing  Principal Discharge Diagnosis: Right hip fracture with secondary deconditioning  Reason for Chronic Case Management: Co-morbidities as follows:  Osteoporosis, Parkinsonism, Depression  Post-discharge Communication:   Call Completed: Yes, with patient   Interpreter Needed: No   Please check all that apply:  ? Patient is caring for self at home.  X Patient has caregiver. If so, name and best contact number: Patient employs around-the-clock private aids  ? Patient is knowledgeable of his/her condition(s) and/or treatment.  ? Family and/or caregiver is knowledgeable of patient's condition(s) and/or treatment.   Medication Reconciliation:  ? Medication list reviewed with patient.  X Patient has all discharge medications (Ability to afford, access, adherence, pharmacy) Patient uses [facility] next to home. Has all meds including sinemet which she began Sunday, 11/27/2016. Patient inquired if she should continue taking ASA 81 mg. Instructed to hold this med till she sees PCP on 12/21/2016. Preceptor Dr. Nani Ravens, concurred.  ? Patient has O2/CPAP ordered? If so, name of company that supplies:  Activities of Daily Living:  ? Independent  X Needs assist (describe) Private aids help patient with all ADLs (bathing, dressing, toileting, eating), take care of housework, laundry and cooking. ? Total Care (describe)   Community resources in place for patient:  X None, other than privately employed aids as above ? Home Health If so, name of agency: ? [facility] If so, name of Care Manager and contact number:   ? Assisted Living  ? Hospice  ? Support Group    Topics discussed:   Support System: Husband, niece  Home DME: Ordered at  discharge? Does patient have? Patient has walker but states she is "not walking right now." Using wheelchair only at this time.  Transportation: Barriers? Patient has own car, however, relies on main caregiver to drive her to all appts. Currently caregiver is only available Tuesdays and Wednesdays.  Food/Nutrition: (ability to afford, access, use of any community resources) Denies food insecurity  TOC appointment scheduled for: 12/21/2016 at 1045 with PCP  Identified Barriers: Transportation as above  Questions/concerns: Patient states she is feeling, "pretty well." Denies pain except when "overdoes it" referring to PT or when "weather acts up." C/o arthritis pain at that time in hands, feet, knees and hips bilaterally. Patient made aware that PCP is out of country at present and is unable to call patient. Patient states she is fine to wait to see PCP on 12/21/2016 but will contact this RN if any needs arise before then.  Hubbard Hartshorn, RN, BSN

## 2016-11-29 NOTE — Telephone Encounter (Signed)
Pt called to schedule her follow up appointment from her rehab stay. They wanted her to be checked out in 7 days. The first appointment with Dr. Erin Hearing is 12/14/16 but she can not come that  Day since she has another appointment. We have schedule the appointment for 10/20/17. She is feeling good and really only wanted to see her doctor or Dr. Burr Medico but she only has a ride on Tuesday or Wednesday. She would like to speak to Dr. Erin Hearing to make sure that this is okay and see what he might want her to do. jw

## 2016-11-30 DIAGNOSIS — F329 Major depressive disorder, single episode, unspecified: Secondary | ICD-10-CM | POA: Diagnosis not present

## 2016-11-30 DIAGNOSIS — M810 Age-related osteoporosis without current pathological fracture: Secondary | ICD-10-CM | POA: Diagnosis not present

## 2016-11-30 DIAGNOSIS — M47896 Other spondylosis, lumbar region: Secondary | ICD-10-CM | POA: Diagnosis not present

## 2016-11-30 DIAGNOSIS — C189 Malignant neoplasm of colon, unspecified: Secondary | ICD-10-CM | POA: Diagnosis not present

## 2016-11-30 DIAGNOSIS — S72001D Fracture of unspecified part of neck of right femur, subsequent encounter for closed fracture with routine healing: Secondary | ICD-10-CM | POA: Diagnosis not present

## 2016-11-30 DIAGNOSIS — M1991 Primary osteoarthritis, unspecified site: Secondary | ICD-10-CM | POA: Diagnosis not present

## 2016-11-30 NOTE — Telephone Encounter (Signed)
Please cal in the verbal order.   Thanks!

## 2016-11-30 NOTE — Telephone Encounter (Signed)
Left message for Pramod to call me back to give verbal ok. I was weary to leave verbal ok on VM.

## 2016-11-30 NOTE — Telephone Encounter (Signed)
Left voice message for Pramod, Physical Therapist with [facility] for physical therapy twice a week for 8 weeks per Dr. Erin Hearing. Derl Barrow, RN

## 2016-12-01 DIAGNOSIS — C189 Malignant neoplasm of colon, unspecified: Secondary | ICD-10-CM | POA: Diagnosis not present

## 2016-12-01 DIAGNOSIS — M47896 Other spondylosis, lumbar region: Secondary | ICD-10-CM | POA: Diagnosis not present

## 2016-12-01 DIAGNOSIS — M810 Age-related osteoporosis without current pathological fracture: Secondary | ICD-10-CM | POA: Diagnosis not present

## 2016-12-01 DIAGNOSIS — S72001D Fracture of unspecified part of neck of right femur, subsequent encounter for closed fracture with routine healing: Secondary | ICD-10-CM | POA: Diagnosis not present

## 2016-12-01 DIAGNOSIS — F329 Major depressive disorder, single episode, unspecified: Secondary | ICD-10-CM | POA: Diagnosis not present

## 2016-12-01 DIAGNOSIS — M1991 Primary osteoarthritis, unspecified site: Secondary | ICD-10-CM | POA: Diagnosis not present

## 2016-12-02 DIAGNOSIS — C189 Malignant neoplasm of colon, unspecified: Secondary | ICD-10-CM | POA: Diagnosis not present

## 2016-12-02 DIAGNOSIS — S72001D Fracture of unspecified part of neck of right femur, subsequent encounter for closed fracture with routine healing: Secondary | ICD-10-CM | POA: Diagnosis not present

## 2016-12-02 DIAGNOSIS — M47896 Other spondylosis, lumbar region: Secondary | ICD-10-CM | POA: Diagnosis not present

## 2016-12-02 DIAGNOSIS — F329 Major depressive disorder, single episode, unspecified: Secondary | ICD-10-CM | POA: Diagnosis not present

## 2016-12-02 DIAGNOSIS — M1991 Primary osteoarthritis, unspecified site: Secondary | ICD-10-CM | POA: Diagnosis not present

## 2016-12-02 DIAGNOSIS — M810 Age-related osteoporosis without current pathological fracture: Secondary | ICD-10-CM | POA: Diagnosis not present

## 2016-12-05 DIAGNOSIS — F329 Major depressive disorder, single episode, unspecified: Secondary | ICD-10-CM | POA: Diagnosis not present

## 2016-12-05 DIAGNOSIS — M1991 Primary osteoarthritis, unspecified site: Secondary | ICD-10-CM | POA: Diagnosis not present

## 2016-12-05 DIAGNOSIS — M810 Age-related osteoporosis without current pathological fracture: Secondary | ICD-10-CM | POA: Diagnosis not present

## 2016-12-05 DIAGNOSIS — M47896 Other spondylosis, lumbar region: Secondary | ICD-10-CM | POA: Diagnosis not present

## 2016-12-05 DIAGNOSIS — C189 Malignant neoplasm of colon, unspecified: Secondary | ICD-10-CM | POA: Diagnosis not present

## 2016-12-05 DIAGNOSIS — S72001D Fracture of unspecified part of neck of right femur, subsequent encounter for closed fracture with routine healing: Secondary | ICD-10-CM | POA: Diagnosis not present

## 2016-12-08 DIAGNOSIS — C189 Malignant neoplasm of colon, unspecified: Secondary | ICD-10-CM | POA: Diagnosis not present

## 2016-12-08 DIAGNOSIS — S72001D Fracture of unspecified part of neck of right femur, subsequent encounter for closed fracture with routine healing: Secondary | ICD-10-CM | POA: Diagnosis not present

## 2016-12-08 DIAGNOSIS — M47896 Other spondylosis, lumbar region: Secondary | ICD-10-CM | POA: Diagnosis not present

## 2016-12-08 DIAGNOSIS — M810 Age-related osteoporosis without current pathological fracture: Secondary | ICD-10-CM | POA: Diagnosis not present

## 2016-12-08 DIAGNOSIS — F329 Major depressive disorder, single episode, unspecified: Secondary | ICD-10-CM | POA: Diagnosis not present

## 2016-12-08 DIAGNOSIS — M1991 Primary osteoarthritis, unspecified site: Secondary | ICD-10-CM | POA: Diagnosis not present

## 2016-12-12 DIAGNOSIS — S72001D Fracture of unspecified part of neck of right femur, subsequent encounter for closed fracture with routine healing: Secondary | ICD-10-CM | POA: Diagnosis not present

## 2016-12-12 DIAGNOSIS — M47896 Other spondylosis, lumbar region: Secondary | ICD-10-CM | POA: Diagnosis not present

## 2016-12-12 DIAGNOSIS — C189 Malignant neoplasm of colon, unspecified: Secondary | ICD-10-CM | POA: Diagnosis not present

## 2016-12-12 DIAGNOSIS — M810 Age-related osteoporosis without current pathological fracture: Secondary | ICD-10-CM | POA: Diagnosis not present

## 2016-12-12 DIAGNOSIS — M1991 Primary osteoarthritis, unspecified site: Secondary | ICD-10-CM | POA: Diagnosis not present

## 2016-12-12 DIAGNOSIS — F329 Major depressive disorder, single episode, unspecified: Secondary | ICD-10-CM | POA: Diagnosis not present

## 2016-12-15 DIAGNOSIS — S72001D Fracture of unspecified part of neck of right femur, subsequent encounter for closed fracture with routine healing: Secondary | ICD-10-CM | POA: Diagnosis not present

## 2016-12-15 DIAGNOSIS — M810 Age-related osteoporosis without current pathological fracture: Secondary | ICD-10-CM | POA: Diagnosis not present

## 2016-12-15 DIAGNOSIS — M47896 Other spondylosis, lumbar region: Secondary | ICD-10-CM | POA: Diagnosis not present

## 2016-12-15 DIAGNOSIS — C189 Malignant neoplasm of colon, unspecified: Secondary | ICD-10-CM | POA: Diagnosis not present

## 2016-12-15 DIAGNOSIS — M1991 Primary osteoarthritis, unspecified site: Secondary | ICD-10-CM | POA: Diagnosis not present

## 2016-12-15 DIAGNOSIS — F329 Major depressive disorder, single episode, unspecified: Secondary | ICD-10-CM | POA: Diagnosis not present

## 2016-12-19 DIAGNOSIS — M47896 Other spondylosis, lumbar region: Secondary | ICD-10-CM | POA: Diagnosis not present

## 2016-12-19 DIAGNOSIS — F329 Major depressive disorder, single episode, unspecified: Secondary | ICD-10-CM | POA: Diagnosis not present

## 2016-12-19 DIAGNOSIS — M810 Age-related osteoporosis without current pathological fracture: Secondary | ICD-10-CM | POA: Diagnosis not present

## 2016-12-19 DIAGNOSIS — M1991 Primary osteoarthritis, unspecified site: Secondary | ICD-10-CM | POA: Diagnosis not present

## 2016-12-19 DIAGNOSIS — S72001D Fracture of unspecified part of neck of right femur, subsequent encounter for closed fracture with routine healing: Secondary | ICD-10-CM | POA: Diagnosis not present

## 2016-12-19 DIAGNOSIS — C189 Malignant neoplasm of colon, unspecified: Secondary | ICD-10-CM | POA: Diagnosis not present

## 2016-12-20 ENCOUNTER — Telehealth: Payer: Self-pay | Admitting: *Deleted

## 2016-12-20 NOTE — Telephone Encounter (Signed)
Left message on VM reminding of tomorrow's appt with PCP. Hubbard Hartshorn, RN, BSN

## 2016-12-21 ENCOUNTER — Encounter: Payer: Self-pay | Admitting: Family Medicine

## 2016-12-21 ENCOUNTER — Ambulatory Visit (INDEPENDENT_AMBULATORY_CARE_PROVIDER_SITE_OTHER): Payer: Medicare Other | Admitting: Family Medicine

## 2016-12-21 DIAGNOSIS — S72001A Fracture of unspecified part of neck of right femur, initial encounter for closed fracture: Secondary | ICD-10-CM | POA: Diagnosis present

## 2016-12-21 DIAGNOSIS — G8929 Other chronic pain: Secondary | ICD-10-CM

## 2016-12-21 DIAGNOSIS — M25512 Pain in left shoulder: Secondary | ICD-10-CM

## 2016-12-21 DIAGNOSIS — S72001D Fracture of unspecified part of neck of right femur, subsequent encounter for closed fracture with routine healing: Secondary | ICD-10-CM

## 2016-12-21 DIAGNOSIS — R269 Unspecified abnormalities of gait and mobility: Secondary | ICD-10-CM

## 2016-12-21 DIAGNOSIS — F3289 Other specified depressive episodes: Secondary | ICD-10-CM | POA: Diagnosis not present

## 2016-12-21 NOTE — Assessment & Plan Note (Signed)
Under good control 

## 2016-12-21 NOTE — Progress Notes (Signed)
Subjective  Patient is presenting with the following illnesses  Hip Fracture Out of NH for a few weeks.  Off all pain medications except as needed tylenol.   Able to walk with walker when Physical Therapy is present but not on her own or with her aide.     Gait Instability Parkinsonism  Needs mostly balance assistance to rise (hand on her lower back).  Always uses walker.  Feels stronger since doing rehab.  Tried Sinemet for a few times but gave her intolerable diarrhea and did not seem to help.  Depression Feels generally very well on Zoloft 50 mg.  Rarely upset or crying.  Eating well.  Sleeping ok.   Chief Complaint noted Review of Symptoms - see HPI PMH - Smoking status noted.     Objective Vital Signs reviewed Alert interactive happy Able to stand from wc with minimal assistance - supporting her lower back  Trace edema at ankles Heart - Regular rate and rhythm.  No murmurs, gallops or rubs.   Lungs:  Normal respiratory effort, chest expands symmetrically. Lungs are clear to auscultation, no crackles or wheezes.     Assessments/Plans  No problem-specific Assessment & Plan notes found for this encounter.   See Encounter view if individual problem A/Ps not visible See after visit summary for details of patient instuctions

## 2016-12-21 NOTE — Assessment & Plan Note (Signed)
Healing well.

## 2016-12-21 NOTE — Assessment & Plan Note (Signed)
Stable actually maybe better since hip fracture and rehab.  Continue to work with Physical Therapy.

## 2016-12-21 NOTE — Assessment & Plan Note (Signed)
Improved with Physical Therapy As needed tylenol

## 2016-12-21 NOTE — Patient Instructions (Signed)
Good to see you today!  Thanks for coming in.  Keep taking all medications as you are  Stand up 3-4 times stand up with a walker.     Investigate having someone help you walk outside the Physical Therapy visits.     Cut back on sweets and fats

## 2016-12-22 DIAGNOSIS — S72001D Fracture of unspecified part of neck of right femur, subsequent encounter for closed fracture with routine healing: Secondary | ICD-10-CM | POA: Diagnosis not present

## 2016-12-22 DIAGNOSIS — M810 Age-related osteoporosis without current pathological fracture: Secondary | ICD-10-CM | POA: Diagnosis not present

## 2016-12-22 DIAGNOSIS — F329 Major depressive disorder, single episode, unspecified: Secondary | ICD-10-CM | POA: Diagnosis not present

## 2016-12-22 DIAGNOSIS — C189 Malignant neoplasm of colon, unspecified: Secondary | ICD-10-CM | POA: Diagnosis not present

## 2016-12-22 DIAGNOSIS — M1991 Primary osteoarthritis, unspecified site: Secondary | ICD-10-CM | POA: Diagnosis not present

## 2016-12-22 DIAGNOSIS — M47896 Other spondylosis, lumbar region: Secondary | ICD-10-CM | POA: Diagnosis not present

## 2016-12-26 DIAGNOSIS — M47896 Other spondylosis, lumbar region: Secondary | ICD-10-CM | POA: Diagnosis not present

## 2016-12-26 DIAGNOSIS — F329 Major depressive disorder, single episode, unspecified: Secondary | ICD-10-CM | POA: Diagnosis not present

## 2016-12-26 DIAGNOSIS — M810 Age-related osteoporosis without current pathological fracture: Secondary | ICD-10-CM | POA: Diagnosis not present

## 2016-12-26 DIAGNOSIS — S72001D Fracture of unspecified part of neck of right femur, subsequent encounter for closed fracture with routine healing: Secondary | ICD-10-CM | POA: Diagnosis not present

## 2016-12-26 DIAGNOSIS — M1991 Primary osteoarthritis, unspecified site: Secondary | ICD-10-CM | POA: Diagnosis not present

## 2016-12-26 DIAGNOSIS — C189 Malignant neoplasm of colon, unspecified: Secondary | ICD-10-CM | POA: Diagnosis not present

## 2016-12-29 DIAGNOSIS — M1991 Primary osteoarthritis, unspecified site: Secondary | ICD-10-CM | POA: Diagnosis not present

## 2016-12-29 DIAGNOSIS — M47896 Other spondylosis, lumbar region: Secondary | ICD-10-CM | POA: Diagnosis not present

## 2016-12-29 DIAGNOSIS — C189 Malignant neoplasm of colon, unspecified: Secondary | ICD-10-CM | POA: Diagnosis not present

## 2016-12-29 DIAGNOSIS — S72001D Fracture of unspecified part of neck of right femur, subsequent encounter for closed fracture with routine healing: Secondary | ICD-10-CM | POA: Diagnosis not present

## 2016-12-29 DIAGNOSIS — M810 Age-related osteoporosis without current pathological fracture: Secondary | ICD-10-CM | POA: Diagnosis not present

## 2016-12-29 DIAGNOSIS — F329 Major depressive disorder, single episode, unspecified: Secondary | ICD-10-CM | POA: Diagnosis not present

## 2016-12-30 DIAGNOSIS — M1991 Primary osteoarthritis, unspecified site: Secondary | ICD-10-CM | POA: Diagnosis not present

## 2016-12-30 DIAGNOSIS — M47896 Other spondylosis, lumbar region: Secondary | ICD-10-CM | POA: Diagnosis not present

## 2016-12-30 DIAGNOSIS — M810 Age-related osteoporosis without current pathological fracture: Secondary | ICD-10-CM | POA: Diagnosis not present

## 2016-12-30 DIAGNOSIS — S72001D Fracture of unspecified part of neck of right femur, subsequent encounter for closed fracture with routine healing: Secondary | ICD-10-CM | POA: Diagnosis not present

## 2016-12-30 DIAGNOSIS — C189 Malignant neoplasm of colon, unspecified: Secondary | ICD-10-CM | POA: Diagnosis not present

## 2016-12-30 DIAGNOSIS — F329 Major depressive disorder, single episode, unspecified: Secondary | ICD-10-CM | POA: Diagnosis not present

## 2017-01-02 DIAGNOSIS — M47896 Other spondylosis, lumbar region: Secondary | ICD-10-CM | POA: Diagnosis not present

## 2017-01-02 DIAGNOSIS — M810 Age-related osteoporosis without current pathological fracture: Secondary | ICD-10-CM | POA: Diagnosis not present

## 2017-01-02 DIAGNOSIS — C189 Malignant neoplasm of colon, unspecified: Secondary | ICD-10-CM | POA: Diagnosis not present

## 2017-01-02 DIAGNOSIS — S72001D Fracture of unspecified part of neck of right femur, subsequent encounter for closed fracture with routine healing: Secondary | ICD-10-CM | POA: Diagnosis not present

## 2017-01-02 DIAGNOSIS — M1991 Primary osteoarthritis, unspecified site: Secondary | ICD-10-CM | POA: Diagnosis not present

## 2017-01-02 DIAGNOSIS — F329 Major depressive disorder, single episode, unspecified: Secondary | ICD-10-CM | POA: Diagnosis not present

## 2017-01-05 DIAGNOSIS — M1991 Primary osteoarthritis, unspecified site: Secondary | ICD-10-CM | POA: Diagnosis not present

## 2017-01-05 DIAGNOSIS — C189 Malignant neoplasm of colon, unspecified: Secondary | ICD-10-CM | POA: Diagnosis not present

## 2017-01-05 DIAGNOSIS — M47896 Other spondylosis, lumbar region: Secondary | ICD-10-CM | POA: Diagnosis not present

## 2017-01-05 DIAGNOSIS — S72001D Fracture of unspecified part of neck of right femur, subsequent encounter for closed fracture with routine healing: Secondary | ICD-10-CM | POA: Diagnosis not present

## 2017-01-05 DIAGNOSIS — F329 Major depressive disorder, single episode, unspecified: Secondary | ICD-10-CM | POA: Diagnosis not present

## 2017-01-05 DIAGNOSIS — M810 Age-related osteoporosis without current pathological fracture: Secondary | ICD-10-CM | POA: Diagnosis not present

## 2017-01-09 DIAGNOSIS — M1991 Primary osteoarthritis, unspecified site: Secondary | ICD-10-CM | POA: Diagnosis not present

## 2017-01-09 DIAGNOSIS — M810 Age-related osteoporosis without current pathological fracture: Secondary | ICD-10-CM | POA: Diagnosis not present

## 2017-01-09 DIAGNOSIS — C189 Malignant neoplasm of colon, unspecified: Secondary | ICD-10-CM | POA: Diagnosis not present

## 2017-01-09 DIAGNOSIS — S72001D Fracture of unspecified part of neck of right femur, subsequent encounter for closed fracture with routine healing: Secondary | ICD-10-CM | POA: Diagnosis not present

## 2017-01-09 DIAGNOSIS — M47896 Other spondylosis, lumbar region: Secondary | ICD-10-CM | POA: Diagnosis not present

## 2017-01-09 DIAGNOSIS — F329 Major depressive disorder, single episode, unspecified: Secondary | ICD-10-CM | POA: Diagnosis not present

## 2017-01-12 DIAGNOSIS — F329 Major depressive disorder, single episode, unspecified: Secondary | ICD-10-CM | POA: Diagnosis not present

## 2017-01-12 DIAGNOSIS — S72001D Fracture of unspecified part of neck of right femur, subsequent encounter for closed fracture with routine healing: Secondary | ICD-10-CM | POA: Diagnosis not present

## 2017-01-12 DIAGNOSIS — C189 Malignant neoplasm of colon, unspecified: Secondary | ICD-10-CM | POA: Diagnosis not present

## 2017-01-12 DIAGNOSIS — M47896 Other spondylosis, lumbar region: Secondary | ICD-10-CM | POA: Diagnosis not present

## 2017-01-12 DIAGNOSIS — M1991 Primary osteoarthritis, unspecified site: Secondary | ICD-10-CM | POA: Diagnosis not present

## 2017-01-12 DIAGNOSIS — M810 Age-related osteoporosis without current pathological fracture: Secondary | ICD-10-CM | POA: Diagnosis not present

## 2017-01-19 DIAGNOSIS — M1991 Primary osteoarthritis, unspecified site: Secondary | ICD-10-CM | POA: Diagnosis not present

## 2017-01-19 DIAGNOSIS — S72001D Fracture of unspecified part of neck of right femur, subsequent encounter for closed fracture with routine healing: Secondary | ICD-10-CM | POA: Diagnosis not present

## 2017-01-19 DIAGNOSIS — M47896 Other spondylosis, lumbar region: Secondary | ICD-10-CM | POA: Diagnosis not present

## 2017-01-19 DIAGNOSIS — M810 Age-related osteoporosis without current pathological fracture: Secondary | ICD-10-CM | POA: Diagnosis not present

## 2017-01-19 DIAGNOSIS — C189 Malignant neoplasm of colon, unspecified: Secondary | ICD-10-CM | POA: Diagnosis not present

## 2017-01-19 DIAGNOSIS — F329 Major depressive disorder, single episode, unspecified: Secondary | ICD-10-CM | POA: Diagnosis not present

## 2017-01-23 DIAGNOSIS — M810 Age-related osteoporosis without current pathological fracture: Secondary | ICD-10-CM | POA: Diagnosis not present

## 2017-01-23 DIAGNOSIS — M47896 Other spondylosis, lumbar region: Secondary | ICD-10-CM | POA: Diagnosis not present

## 2017-01-23 DIAGNOSIS — M1991 Primary osteoarthritis, unspecified site: Secondary | ICD-10-CM | POA: Diagnosis not present

## 2017-01-23 DIAGNOSIS — F329 Major depressive disorder, single episode, unspecified: Secondary | ICD-10-CM | POA: Diagnosis not present

## 2017-01-23 DIAGNOSIS — C189 Malignant neoplasm of colon, unspecified: Secondary | ICD-10-CM | POA: Diagnosis not present

## 2017-01-23 DIAGNOSIS — S72001D Fracture of unspecified part of neck of right femur, subsequent encounter for closed fracture with routine healing: Secondary | ICD-10-CM | POA: Diagnosis not present

## 2017-01-26 DIAGNOSIS — M47896 Other spondylosis, lumbar region: Secondary | ICD-10-CM | POA: Diagnosis not present

## 2017-01-26 DIAGNOSIS — M810 Age-related osteoporosis without current pathological fracture: Secondary | ICD-10-CM | POA: Diagnosis not present

## 2017-01-26 DIAGNOSIS — C189 Malignant neoplasm of colon, unspecified: Secondary | ICD-10-CM | POA: Diagnosis not present

## 2017-01-26 DIAGNOSIS — F329 Major depressive disorder, single episode, unspecified: Secondary | ICD-10-CM | POA: Diagnosis not present

## 2017-01-26 DIAGNOSIS — M1991 Primary osteoarthritis, unspecified site: Secondary | ICD-10-CM | POA: Diagnosis not present

## 2017-01-26 DIAGNOSIS — S72001D Fracture of unspecified part of neck of right femur, subsequent encounter for closed fracture with routine healing: Secondary | ICD-10-CM | POA: Diagnosis not present

## 2017-01-28 DIAGNOSIS — E785 Hyperlipidemia, unspecified: Secondary | ICD-10-CM | POA: Diagnosis not present

## 2017-01-28 DIAGNOSIS — Z7982 Long term (current) use of aspirin: Secondary | ICD-10-CM | POA: Diagnosis not present

## 2017-01-28 DIAGNOSIS — M810 Age-related osteoporosis without current pathological fracture: Secondary | ICD-10-CM | POA: Diagnosis not present

## 2017-01-28 DIAGNOSIS — S72001D Fracture of unspecified part of neck of right femur, subsequent encounter for closed fracture with routine healing: Secondary | ICD-10-CM | POA: Diagnosis not present

## 2017-01-28 DIAGNOSIS — F329 Major depressive disorder, single episode, unspecified: Secondary | ICD-10-CM | POA: Diagnosis not present

## 2017-01-28 DIAGNOSIS — H35349 Macular cyst, hole, or pseudohole, unspecified eye: Secondary | ICD-10-CM | POA: Diagnosis not present

## 2017-01-28 DIAGNOSIS — M1991 Primary osteoarthritis, unspecified site: Secondary | ICD-10-CM | POA: Diagnosis not present

## 2017-01-28 DIAGNOSIS — Z9181 History of falling: Secondary | ICD-10-CM | POA: Diagnosis not present

## 2017-01-28 DIAGNOSIS — C189 Malignant neoplasm of colon, unspecified: Secondary | ICD-10-CM | POA: Diagnosis not present

## 2017-01-28 DIAGNOSIS — M47896 Other spondylosis, lumbar region: Secondary | ICD-10-CM | POA: Diagnosis not present

## 2017-01-30 ENCOUNTER — Other Ambulatory Visit: Payer: Self-pay | Admitting: *Deleted

## 2017-01-30 DIAGNOSIS — F3289 Other specified depressive episodes: Secondary | ICD-10-CM

## 2017-01-30 NOTE — Telephone Encounter (Signed)
Please contact patient and ask how many mg of Zoloft (sertraline) she is taking per day.  The refill request is unclear  Thanks  [place]

## 2017-02-01 NOTE — Telephone Encounter (Signed)
Patient states she taking 2 tabs (50mg ) daily.

## 2017-02-02 ENCOUNTER — Other Ambulatory Visit: Payer: Self-pay | Admitting: Family Medicine

## 2017-02-02 DIAGNOSIS — M1991 Primary osteoarthritis, unspecified site: Secondary | ICD-10-CM | POA: Diagnosis not present

## 2017-02-02 DIAGNOSIS — S72001D Fracture of unspecified part of neck of right femur, subsequent encounter for closed fracture with routine healing: Secondary | ICD-10-CM | POA: Diagnosis not present

## 2017-02-02 DIAGNOSIS — M810 Age-related osteoporosis without current pathological fracture: Secondary | ICD-10-CM | POA: Diagnosis not present

## 2017-02-02 DIAGNOSIS — F3289 Other specified depressive episodes: Secondary | ICD-10-CM

## 2017-02-02 DIAGNOSIS — C189 Malignant neoplasm of colon, unspecified: Secondary | ICD-10-CM | POA: Diagnosis not present

## 2017-02-02 DIAGNOSIS — F329 Major depressive disorder, single episode, unspecified: Secondary | ICD-10-CM | POA: Diagnosis not present

## 2017-02-02 DIAGNOSIS — M47896 Other spondylosis, lumbar region: Secondary | ICD-10-CM | POA: Diagnosis not present

## 2017-02-02 NOTE — Telephone Encounter (Signed)
Pt states she has been out of medication for 5 days and would like to get this refilled as soon as possible. ep

## 2017-02-03 DIAGNOSIS — F329 Major depressive disorder, single episode, unspecified: Secondary | ICD-10-CM | POA: Diagnosis not present

## 2017-02-03 DIAGNOSIS — M810 Age-related osteoporosis without current pathological fracture: Secondary | ICD-10-CM | POA: Diagnosis not present

## 2017-02-03 DIAGNOSIS — M47896 Other spondylosis, lumbar region: Secondary | ICD-10-CM | POA: Diagnosis not present

## 2017-02-03 DIAGNOSIS — C189 Malignant neoplasm of colon, unspecified: Secondary | ICD-10-CM | POA: Diagnosis not present

## 2017-02-03 DIAGNOSIS — S72001D Fracture of unspecified part of neck of right femur, subsequent encounter for closed fracture with routine healing: Secondary | ICD-10-CM | POA: Diagnosis not present

## 2017-02-03 DIAGNOSIS — M1991 Primary osteoarthritis, unspecified site: Secondary | ICD-10-CM | POA: Diagnosis not present

## 2017-02-06 DIAGNOSIS — S72001D Fracture of unspecified part of neck of right femur, subsequent encounter for closed fracture with routine healing: Secondary | ICD-10-CM | POA: Diagnosis not present

## 2017-02-06 DIAGNOSIS — M810 Age-related osteoporosis without current pathological fracture: Secondary | ICD-10-CM | POA: Diagnosis not present

## 2017-02-06 DIAGNOSIS — M47896 Other spondylosis, lumbar region: Secondary | ICD-10-CM | POA: Diagnosis not present

## 2017-02-06 DIAGNOSIS — C189 Malignant neoplasm of colon, unspecified: Secondary | ICD-10-CM | POA: Diagnosis not present

## 2017-02-06 DIAGNOSIS — M1991 Primary osteoarthritis, unspecified site: Secondary | ICD-10-CM | POA: Diagnosis not present

## 2017-02-06 DIAGNOSIS — F329 Major depressive disorder, single episode, unspecified: Secondary | ICD-10-CM | POA: Diagnosis not present

## 2017-02-09 DIAGNOSIS — S72001D Fracture of unspecified part of neck of right femur, subsequent encounter for closed fracture with routine healing: Secondary | ICD-10-CM | POA: Diagnosis not present

## 2017-02-09 DIAGNOSIS — M47896 Other spondylosis, lumbar region: Secondary | ICD-10-CM | POA: Diagnosis not present

## 2017-02-09 DIAGNOSIS — F329 Major depressive disorder, single episode, unspecified: Secondary | ICD-10-CM | POA: Diagnosis not present

## 2017-02-09 DIAGNOSIS — M810 Age-related osteoporosis without current pathological fracture: Secondary | ICD-10-CM | POA: Diagnosis not present

## 2017-02-09 DIAGNOSIS — C189 Malignant neoplasm of colon, unspecified: Secondary | ICD-10-CM | POA: Diagnosis not present

## 2017-02-09 DIAGNOSIS — M1991 Primary osteoarthritis, unspecified site: Secondary | ICD-10-CM | POA: Diagnosis not present

## 2017-02-16 DIAGNOSIS — M810 Age-related osteoporosis without current pathological fracture: Secondary | ICD-10-CM | POA: Diagnosis not present

## 2017-02-16 DIAGNOSIS — M1991 Primary osteoarthritis, unspecified site: Secondary | ICD-10-CM | POA: Diagnosis not present

## 2017-02-16 DIAGNOSIS — F329 Major depressive disorder, single episode, unspecified: Secondary | ICD-10-CM | POA: Diagnosis not present

## 2017-02-16 DIAGNOSIS — C189 Malignant neoplasm of colon, unspecified: Secondary | ICD-10-CM | POA: Diagnosis not present

## 2017-02-16 DIAGNOSIS — M47896 Other spondylosis, lumbar region: Secondary | ICD-10-CM | POA: Diagnosis not present

## 2017-02-16 DIAGNOSIS — S72001D Fracture of unspecified part of neck of right femur, subsequent encounter for closed fracture with routine healing: Secondary | ICD-10-CM | POA: Diagnosis not present

## 2017-02-20 DIAGNOSIS — F329 Major depressive disorder, single episode, unspecified: Secondary | ICD-10-CM | POA: Diagnosis not present

## 2017-02-20 DIAGNOSIS — M810 Age-related osteoporosis without current pathological fracture: Secondary | ICD-10-CM | POA: Diagnosis not present

## 2017-02-20 DIAGNOSIS — C189 Malignant neoplasm of colon, unspecified: Secondary | ICD-10-CM | POA: Diagnosis not present

## 2017-02-20 DIAGNOSIS — M47896 Other spondylosis, lumbar region: Secondary | ICD-10-CM | POA: Diagnosis not present

## 2017-02-20 DIAGNOSIS — M1991 Primary osteoarthritis, unspecified site: Secondary | ICD-10-CM | POA: Diagnosis not present

## 2017-02-20 DIAGNOSIS — S72001D Fracture of unspecified part of neck of right femur, subsequent encounter for closed fracture with routine healing: Secondary | ICD-10-CM | POA: Diagnosis not present

## 2017-02-21 ENCOUNTER — Ambulatory Visit: Payer: Medicare Other | Admitting: Neurology

## 2017-02-23 DIAGNOSIS — M810 Age-related osteoporosis without current pathological fracture: Secondary | ICD-10-CM | POA: Diagnosis not present

## 2017-02-23 DIAGNOSIS — C189 Malignant neoplasm of colon, unspecified: Secondary | ICD-10-CM | POA: Diagnosis not present

## 2017-02-23 DIAGNOSIS — F329 Major depressive disorder, single episode, unspecified: Secondary | ICD-10-CM | POA: Diagnosis not present

## 2017-02-23 DIAGNOSIS — M47896 Other spondylosis, lumbar region: Secondary | ICD-10-CM | POA: Diagnosis not present

## 2017-02-23 DIAGNOSIS — S72001D Fracture of unspecified part of neck of right femur, subsequent encounter for closed fracture with routine healing: Secondary | ICD-10-CM | POA: Diagnosis not present

## 2017-02-23 DIAGNOSIS — M1991 Primary osteoarthritis, unspecified site: Secondary | ICD-10-CM | POA: Diagnosis not present

## 2017-02-27 DIAGNOSIS — C189 Malignant neoplasm of colon, unspecified: Secondary | ICD-10-CM | POA: Diagnosis not present

## 2017-02-27 DIAGNOSIS — M810 Age-related osteoporosis without current pathological fracture: Secondary | ICD-10-CM | POA: Diagnosis not present

## 2017-02-27 DIAGNOSIS — F329 Major depressive disorder, single episode, unspecified: Secondary | ICD-10-CM | POA: Diagnosis not present

## 2017-02-27 DIAGNOSIS — M47896 Other spondylosis, lumbar region: Secondary | ICD-10-CM | POA: Diagnosis not present

## 2017-02-27 DIAGNOSIS — M1991 Primary osteoarthritis, unspecified site: Secondary | ICD-10-CM | POA: Diagnosis not present

## 2017-02-27 DIAGNOSIS — S72001D Fracture of unspecified part of neck of right femur, subsequent encounter for closed fracture with routine healing: Secondary | ICD-10-CM | POA: Diagnosis not present

## 2017-03-02 DIAGNOSIS — S72001D Fracture of unspecified part of neck of right femur, subsequent encounter for closed fracture with routine healing: Secondary | ICD-10-CM | POA: Diagnosis not present

## 2017-03-02 DIAGNOSIS — M1991 Primary osteoarthritis, unspecified site: Secondary | ICD-10-CM | POA: Diagnosis not present

## 2017-03-02 DIAGNOSIS — F329 Major depressive disorder, single episode, unspecified: Secondary | ICD-10-CM | POA: Diagnosis not present

## 2017-03-02 DIAGNOSIS — M810 Age-related osteoporosis without current pathological fracture: Secondary | ICD-10-CM | POA: Diagnosis not present

## 2017-03-02 DIAGNOSIS — C189 Malignant neoplasm of colon, unspecified: Secondary | ICD-10-CM | POA: Diagnosis not present

## 2017-03-02 DIAGNOSIS — M47896 Other spondylosis, lumbar region: Secondary | ICD-10-CM | POA: Diagnosis not present

## 2017-03-06 DIAGNOSIS — F329 Major depressive disorder, single episode, unspecified: Secondary | ICD-10-CM | POA: Diagnosis not present

## 2017-03-06 DIAGNOSIS — M810 Age-related osteoporosis without current pathological fracture: Secondary | ICD-10-CM | POA: Diagnosis not present

## 2017-03-06 DIAGNOSIS — M47896 Other spondylosis, lumbar region: Secondary | ICD-10-CM | POA: Diagnosis not present

## 2017-03-06 DIAGNOSIS — M1991 Primary osteoarthritis, unspecified site: Secondary | ICD-10-CM | POA: Diagnosis not present

## 2017-03-06 DIAGNOSIS — S72001D Fracture of unspecified part of neck of right femur, subsequent encounter for closed fracture with routine healing: Secondary | ICD-10-CM | POA: Diagnosis not present

## 2017-03-06 DIAGNOSIS — C189 Malignant neoplasm of colon, unspecified: Secondary | ICD-10-CM | POA: Diagnosis not present

## 2017-03-08 ENCOUNTER — Encounter: Payer: Self-pay | Admitting: Neurology

## 2017-03-08 ENCOUNTER — Ambulatory Visit (INDEPENDENT_AMBULATORY_CARE_PROVIDER_SITE_OTHER): Payer: Medicare Other | Admitting: Neurology

## 2017-03-08 VITALS — BP 153/74 | HR 68 | Ht 60.0 in | Wt 170.0 lb

## 2017-03-08 DIAGNOSIS — G250 Essential tremor: Secondary | ICD-10-CM

## 2017-03-08 MED ORDER — GABAPENTIN 100 MG PO CAPS: 100.0000 mg | ORAL_CAPSULE | Freq: Three times a day (TID) | ORAL | 6 refills | Status: DC

## 2017-03-08 NOTE — Progress Notes (Signed)
[facility]    Provider:  Dr Jaynee Eagles Referring Provider: Lind Covert, * Primary Care Physician:  Lind Covert, MD   Interval History 03/08/2017: Patient returns today for follow-up of tremor likely essential tremor. She also been diagnosed with parkinsonism past. Did not tolerate Sinemet.She is walking with a walker which is improved. The tremor "comes and goes". Can try low dose neurontin, she was on that in the past without side effects.  Interval history 11/19/2014: [name] is here with her aunt Jennier Schissler. Patient has been diagnosed with parkinsonism but has had a tremor for 5 years in arms and both legs more consistent with essential tremor. She reports she has "foot jumping" of the right foot which is helped by Norco pain medication. She has tremors in the hands which is not at rest but is worse with eating and trying to get food to her mouth. Walking is difficult, her left leg doesn't seem to "listen to her". She had a fall in December. She says her knee gave way in the restroom and her leg gave out and she fell. The right leg gave way. She has terrible arthritis which also impacts her walking. She is in a wheelchair and can't walk currently since her fall.  Her mother had a tremor as well. The tremor impacts her eating and it gets to the point that it is too much trouble to eat. She is spilling things due to the tremor. No significant resting tremor. More of an action tremor in the arms. She can stop the leg shaking voluntarily. Memory is fine, denies any memory issues. She has difficulty walking, her voice is softer, walking slower and recently she can't walk without significant assistance and has some stooping of her posture. Her left leg is more affected with movement, it does not respond to her brain per patient. Niece provides much information  Reviewed previous records:  HISTORY OF PRESENT ILLNESS, Dr. Felecia Shelling 11/19/2014 :  I had the pleasure  seeing you patient,Helen Odom, at [facility] for a neurologic consultation regarding her parkinsonism. She recently moved to the [place] area with her husband. About a year and a half ago, she noted tremor in the right leg and the left greater than right arm. She went to a neurologist who diagnosed her with parkinsonism. He prescribed carbidopa levodopa. However, she took only a couple doses as it made her very nauseous. No other medications were tried. She noted mild weakness in her legs at that time. She feels her arms are not as weak as the legs but they tire out easily. The tremor in her leg is variable, often depending on how she is sitting (worse when ankle is more extended). It is less likely to occur while standing. It is always worse on her right and sometimes just present on the right. The tremor in the arms (left worse than right) are different than the tremors in the legs. The hand tremors are worse when she tries to hold still (like writing and eating). They are less likely to occur at rest.   The weakness in her legs has increased and she feels weaker over the past few months. She is having a lot of difficulty getting out of chairs. The tremors have also worsened. Her gait has progressively worsened over the last couple of years. Specifically, her stride has become shorter and her balance is more unsteady. She is very careful so she does not fall over the past couple of  months she has had more difficulty getting out of a chair and how her caregivers help her stand. When she stands she is able to walk but with a small stride. For longer distances she uses a wheelchair.  She denies any bladder or bowel changes. She has not had any incontinence.  Her mother had a tremor at times when she was older.   Social History   Social History  . Marital status: Married    Spouse name: N/A  . Number of children: N/A  . Years of education: N/A    Occupational History  . Not on file.   Social History Main Topics  . Smoking status: Never Smoker  . Smokeless tobacco: Never Used  . Alcohol use No     Comment: occasional  . Drug use: No  . Sexual activity: No   Other Topics Concern  . Not on file   Social History Narrative   Lives with her husband who has moderate Alz.   They are in an apartment with 24 hour assistance.   Her niece [name] is her most local relation.  She is in IT at [employer].       Family History  Problem Relation Age of Onset  . Congestive Heart Failure Mother     Deceased at 25 yo  . CAD Father   . Heart attack Father 64    Deceased  . Atrial fibrillation Brother     Past Medical History:  Diagnosis Date  . Arthritis   . Cancer ([place])   . Colon cancer ([place])   . Depression   . Heart murmur   . Hyperlipidemia   . Movement disorder   . Neuropathy   . Osteoporosis   . Vision abnormalities     Past Surgical History:  Procedure Laterality Date  . CATARACT EXTRACTION, BILATERAL Bilateral 2015  . CHOLECYSTECTOMY    . COLON RESECTION    . HIP PINNING,CANNULATED Right 10/13/2016   Procedure: RIGHT HIP PINNING;  Surgeon: Renette Butters, MD;  Location: [place];  Service: Orthopedics;  Laterality: Right;    Current Outpatient Prescriptions  Medication Sig Dispense Refill  . acetaminophen (TYLENOL) 325 MG tablet Take 2 tablets (650 mg total) by mouth every 6 (six) hours as needed for mild pain (or Fever >/= 101). 30 tablet 0  . aspirin EC 81 MG tablet Take 81 mg by mouth daily.    . cholecalciferol (VITAMIN D) 1000 UNITS tablet Take 1,000 Units by mouth daily.    Marland Kitchen esomeprazole (NEXIUM) 20 MG capsule Take 20 mg by mouth daily at 12 noon.    . sertraline (ZOLOFT) 50 MG tablet Take 1 tablet (50 mg total) by mouth daily. 90 tablet 1  . vitamin B-12 (CYANOCOBALAMIN) 1000 MCG tablet Take 1,000 mcg by mouth daily.    Marland Kitchen gabapentin (NEURONTIN) 100 MG capsule Take 1 capsule (100 mg total) by mouth 3  (three) times daily. 90 capsule 6   No current facility-administered medications for this visit.     Allergies as of 03/08/2017 - Review Complete 03/08/2017  Allergen Reaction Noted  . Flu virus vaccine Swelling 10/14/2014  . Penicillins Rash 10/14/2014    Vitals: BP (!) 153/74   Pulse 68   Ht 5' (1.524 m)   Wt 170 lb (77.1 kg)   LMP 10/14/1988   BMI 33.20 kg/m  Last Weight:  Wt Readings from Last 1 Encounters:  03/08/17 170 lb (77.1 kg)   Last Height:   Ht  Readings from Last 1 Encounters:  03/08/17 5' (1.524 m)    General:The patient is well-developed and well-nourishedand in no acute distress  Eyes:Funduscopic exam shows normal optic discs and retinal vessels.  Neck:The neck is supple, no carotid bruits are noted. The neck is nontender.Range of motion of the neck is limited to about 20rotation to either side. Flexion and extension is just slightly limited.  Skin:Extremities are without significant edema but the left ankle is asymmetric due to previous injury  Neurologic Exam  Mental status: The patient is alert and oriented x 3 at the time of the examination. The patient has apparent normal recent and remote memory, with an apparently normal attention span and concentration ability. Speech is normal.  Cranial nerves:Hypomimia, Extraocular movements are full. Pupils are equal, round, and reactive to lightand accomodation. Visual fields are full.Facial symmetry is present. There is good facial sensationto soft touch bilaterally.Facial strengthis normal. Trapezius and sternocleidomastoid strength is normal. No dysarthria is noted. The tongue is midline, and the patient has symmetric elevation of the soft palate. No obvious hearing deficits are noted.  Motor:She has a 7 Hz essential tremor in both hands that is intensified by sustained posture. Muscle bulk is normal. Tone is slightly increased in the upper extremities and right leg. The left leg  appears to have more normal tone. There is bilateral hip flexion weakness otherwise strength appears symmetrical and intact.  Sensory:Sensory testing is intact to pinprick, soft touch, vibration sensation, and position sense on all 4 extremities.  Coordination:Cerebellar testing reveals good finger-nose-finger but reduced heel-to-shin bilaterally.  Gait and station:She cannot stand without significant assistance  Reflexes:Deep tendon reflexes increased in her legs and she has clonus at the right ankle. There is spread from the right knee. Reflexes in the arms are brisk without spread. Plantar responses are normal.    Assessment/Plan:  Sequoyah Ramone is an 83 year old woman with a PMHx of Parkinsonism as well as essential tremor in the hands. No resting tremor, not consistent with Parkinson's Disease.  Exam shows clonus in the right leg, increased tone in the upper extremities with cogwheeling in the left arm, hypomimia and a mild essential tremor in her hands.  I agree that the the clonus and increased tone may be due to a spinal cord process such as cervical spondylosis with myelopathy or to a stroke or other lesion in the brain vs Parkinsonism but patient has refused imaging in the past. Tremor in the hands appears to be benign essential tremor. It is disturbing her ability to eat. She is extremely claustrophobic and refuses to do an MRI or CT. Dr. Felecia Shelling had prescribed a benzodiazepine but she doen't remember if it helped.   Gait abnormality: Continue physical therapy, she is improving Essential tremor: try very low dose gabapentin  Cc: Lind Covert, MD  Sarina Ill, MD  [facility] [address] [place], [place] [address]  Phone [phone] Fax [phone]  A total of 15 minutes was spent face-to-face with this patient. Over half this time was spent on counseling patient on the essential tremor and gait abnormality diagnosis and  different diagnostic and therapeutic options available.

## 2017-03-08 NOTE — Patient Instructions (Signed)
Remember to drink plenty of fluid, eat healthy meals and do not skip any meals. Try to eat protein with a every meal and eat a healthy snack such as fruit or nuts in between meals. Try to keep a regular sleep-wake schedule and try to exercise daily, particularly in the form of walking, 20-30 minutes a day, if you can.   As far as your medications are concerned, I would like to suggest: Gabapentin 100mg  three times a day  I would like to see you back as needed, sooner if we need to. Please call us with any interim questions, concerns, problems, updates or refill requests.   Our phone number is [phone]. We also have an after hours call service for urgent matters and there is a physician on-call for urgent questions. For any emergencies you know to call 911 or go to the nearest emergency room  Gabapentin capsules or tablets What is this medicine? GABAPENTIN (GA ba pen tin) is used to control partial seizures in adults with epilepsy. It is also used to treat certain types of nerve pain. This medicine may be used for other purposes; ask your health care provider or pharmacist if you have questions. COMMON BRAND NAME(S): Active-PAC with Gabapentin, Gabarone, Neurontin What should I tell my health care provider before I take this medicine? They need to know if you have any of these conditions: -kidney disease -suicidal thoughts, plans, or attempt; a previous suicide attempt by you or a family member -an unusual or allergic reaction to gabapentin, other medicines, foods, dyes, or preservatives -pregnant or trying to get pregnant -breast-feeding How should I use this medicine? Take this medicine by mouth with a glass of water. Follow the directions on the prescription label. You can take it with or without food. If it upsets your stomach, take it with food.Take your medicine at regular intervals. Do not take it more often than directed. Do not stop taking except on your doctor's advice. If you are  directed to break the 600 or 800 mg tablets in half as part of your dose, the extra half tablet should be used for the next dose. If you have not used the extra half tablet within 28 days, it should be thrown away. A special MedGuide will be given to you by the pharmacist with each prescription and refill. Be sure to read this information carefully each time. Talk to your pediatrician regarding the use of this medicine in children. Special care may be needed. Overdosage: If you think you have taken too much of this medicine contact a poison control center or emergency room at once. NOTE: This medicine is only for you. Do not share this medicine with others. What if I miss a dose? If you miss a dose, take it as soon as you can. If it is almost time for your next dose, take only that dose. Do not take double or extra doses. What may interact with this medicine? Do not take this medicine with any of the following medications: -other gabapentin products This medicine may also interact with the following medications: -alcohol -antacids -antihistamines for allergy, cough and cold -certain medicines for anxiety or sleep -certain medicines for depression or psychotic disturbances -homatropine; hydrocodone -naproxen -narcotic medicines (opiates) for pain -phenothiazines like chlorpromazine, mesoridazine, prochlorperazine, thioridazine This list may not describe all possible interactions. Give your health care provider a list of all the medicines, herbs, non-prescription drugs, or dietary supplements you use. Also tell them if you smoke, drink alcohol, or  use illegal drugs. Some items may interact with your medicine. What should I watch for while using this medicine? Visit your doctor or health care professional for regular checks on your progress. You may want to keep a record at home of how you feel your condition is responding to treatment. You may want to share this information with your doctor or  health care professional at each visit. You should contact your doctor or health care professional if your seizures get worse or if you have any new types of seizures. Do not stop taking this medicine or any of your seizure medicines unless instructed by your doctor or health care professional. Stopping your medicine suddenly can increase your seizures or their severity. Wear a medical identification bracelet or chain if you are taking this medicine for seizures, and carry a card that lists all your medications. You may get drowsy, dizzy, or have blurred vision. Do not drive, use machinery, or do anything that needs mental alertness until you know how this medicine affects you. To reduce dizzy or fainting spells, do not sit or stand up quickly, especially if you are an older patient. Alcohol can increase drowsiness and dizziness. Avoid alcoholic drinks. Your mouth may get dry. Chewing sugarless gum or sucking hard candy, and drinking plenty of water will help. The use of this medicine may increase the chance of suicidal thoughts or actions. Pay special attention to how you are responding while on this medicine. Any worsening of mood, or thoughts of suicide or dying should be reported to your health care professional right away. Women who become pregnant while using this medicine may enroll in the [place] Pregnancy Registry by calling ([phone]. This registry collects information about the safety of antiepileptic drug use during pregnancy. What side effects may I notice from receiving this medicine? Side effects that you should report to your doctor or health care professional as soon as possible: -allergic reactions like skin rash, itching or hives, swelling of the face, lips, or tongue -worsening of mood, thoughts or actions of suicide or dying Side effects that usually do not require medical attention (report to your doctor or health care professional if they continue or  are bothersome): -constipation -difficulty walking or controlling muscle movements -dizziness -nausea -slurred speech -tiredness -tremors -weight gain This list may not describe all possible side effects. Call your doctor for medical advice about side effects. You may report side effects to FDA at 1-800-FDA-1088. Where should I keep my medicine? Keep out of reach of children. This medicine may cause accidental overdose and death if it taken by other adults, children, or pets. Mix any unused medicine with a substance like cat litter or coffee grounds. Then throw the medicine away in a sealed container like a sealed bag or a coffee can with a lid. Do not use the medicine after the expiration date. Store at room temperature between 15 and 30 degrees C (59 and 86 degrees F). NOTE: This sheet is a summary. It may not cover all possible information. If you have questions about this medicine, talk to your doctor, pharmacist, or health care provider.  2018 Elsevier/Gold Standard (2013-12-20 15:26:50)

## 2017-03-09 DIAGNOSIS — M1991 Primary osteoarthritis, unspecified site: Secondary | ICD-10-CM | POA: Diagnosis not present

## 2017-03-09 DIAGNOSIS — M47896 Other spondylosis, lumbar region: Secondary | ICD-10-CM | POA: Diagnosis not present

## 2017-03-09 DIAGNOSIS — F329 Major depressive disorder, single episode, unspecified: Secondary | ICD-10-CM | POA: Diagnosis not present

## 2017-03-09 DIAGNOSIS — M810 Age-related osteoporosis without current pathological fracture: Secondary | ICD-10-CM | POA: Diagnosis not present

## 2017-03-09 DIAGNOSIS — S72001D Fracture of unspecified part of neck of right femur, subsequent encounter for closed fracture with routine healing: Secondary | ICD-10-CM | POA: Diagnosis not present

## 2017-03-09 DIAGNOSIS — C189 Malignant neoplasm of colon, unspecified: Secondary | ICD-10-CM | POA: Diagnosis not present

## 2017-03-16 DIAGNOSIS — M47896 Other spondylosis, lumbar region: Secondary | ICD-10-CM | POA: Diagnosis not present

## 2017-03-16 DIAGNOSIS — M1991 Primary osteoarthritis, unspecified site: Secondary | ICD-10-CM | POA: Diagnosis not present

## 2017-03-16 DIAGNOSIS — C189 Malignant neoplasm of colon, unspecified: Secondary | ICD-10-CM | POA: Diagnosis not present

## 2017-03-16 DIAGNOSIS — S72001D Fracture of unspecified part of neck of right femur, subsequent encounter for closed fracture with routine healing: Secondary | ICD-10-CM | POA: Diagnosis not present

## 2017-03-16 DIAGNOSIS — F329 Major depressive disorder, single episode, unspecified: Secondary | ICD-10-CM | POA: Diagnosis not present

## 2017-03-16 DIAGNOSIS — M810 Age-related osteoporosis without current pathological fracture: Secondary | ICD-10-CM | POA: Diagnosis not present

## 2017-03-17 DIAGNOSIS — M47896 Other spondylosis, lumbar region: Secondary | ICD-10-CM | POA: Diagnosis not present

## 2017-03-17 DIAGNOSIS — C189 Malignant neoplasm of colon, unspecified: Secondary | ICD-10-CM | POA: Diagnosis not present

## 2017-03-17 DIAGNOSIS — F329 Major depressive disorder, single episode, unspecified: Secondary | ICD-10-CM | POA: Diagnosis not present

## 2017-03-17 DIAGNOSIS — S72001D Fracture of unspecified part of neck of right femur, subsequent encounter for closed fracture with routine healing: Secondary | ICD-10-CM | POA: Diagnosis not present

## 2017-03-17 DIAGNOSIS — M1991 Primary osteoarthritis, unspecified site: Secondary | ICD-10-CM | POA: Diagnosis not present

## 2017-03-17 DIAGNOSIS — M810 Age-related osteoporosis without current pathological fracture: Secondary | ICD-10-CM | POA: Diagnosis not present

## 2017-03-20 DIAGNOSIS — M810 Age-related osteoporosis without current pathological fracture: Secondary | ICD-10-CM | POA: Diagnosis not present

## 2017-03-20 DIAGNOSIS — F329 Major depressive disorder, single episode, unspecified: Secondary | ICD-10-CM | POA: Diagnosis not present

## 2017-03-20 DIAGNOSIS — M1991 Primary osteoarthritis, unspecified site: Secondary | ICD-10-CM | POA: Diagnosis not present

## 2017-03-20 DIAGNOSIS — C189 Malignant neoplasm of colon, unspecified: Secondary | ICD-10-CM | POA: Diagnosis not present

## 2017-03-20 DIAGNOSIS — M47896 Other spondylosis, lumbar region: Secondary | ICD-10-CM | POA: Diagnosis not present

## 2017-03-20 DIAGNOSIS — S72001D Fracture of unspecified part of neck of right femur, subsequent encounter for closed fracture with routine healing: Secondary | ICD-10-CM | POA: Diagnosis not present

## 2017-03-24 DIAGNOSIS — M810 Age-related osteoporosis without current pathological fracture: Secondary | ICD-10-CM | POA: Diagnosis not present

## 2017-03-24 DIAGNOSIS — M1991 Primary osteoarthritis, unspecified site: Secondary | ICD-10-CM | POA: Diagnosis not present

## 2017-03-24 DIAGNOSIS — F329 Major depressive disorder, single episode, unspecified: Secondary | ICD-10-CM | POA: Diagnosis not present

## 2017-03-24 DIAGNOSIS — S72001D Fracture of unspecified part of neck of right femur, subsequent encounter for closed fracture with routine healing: Secondary | ICD-10-CM | POA: Diagnosis not present

## 2017-03-24 DIAGNOSIS — C189 Malignant neoplasm of colon, unspecified: Secondary | ICD-10-CM | POA: Diagnosis not present

## 2017-03-24 DIAGNOSIS — M47896 Other spondylosis, lumbar region: Secondary | ICD-10-CM | POA: Diagnosis not present

## 2017-04-18 ENCOUNTER — Ambulatory Visit (INDEPENDENT_AMBULATORY_CARE_PROVIDER_SITE_OTHER): Payer: Medicare Other | Admitting: *Deleted

## 2017-04-18 ENCOUNTER — Encounter: Payer: Self-pay | Admitting: *Deleted

## 2017-04-18 VITALS — BP 164/68 | HR 70 | Temp 98.6°F | Ht 60.0 in | Wt 166.2 lb

## 2017-04-18 DIAGNOSIS — Z0000 Encounter for general adult medical examination without abnormal findings: Secondary | ICD-10-CM

## 2017-04-18 NOTE — Patient Instructions (Addendum)
Helen Odom, Thank you for taking time to come for yourMedicare Wellness Visit. I appreciate your ongoing commitment to your health goals. Please review the following plan we discussed and let me know if I can assist you in the future.   These are the goals we discussed:  Goals    . Blood Pressure < 140/90    . Walk (pt-stated)      Fall Prevention in the Home Falls can cause injuries. They can happen to people of all ages. There are many things you can do to make your home safe and to help prevent falls. What can I do on the outside of my home?  Regularly fix the edges of walkways and driveways and fix any cracks.  Remove anything that might make you trip as you walk through a door, such as a raised step or threshold.  Trim any bushes or trees on the path to your home.  Use bright outdoor lighting.  Clear any walking paths of anything that might make someone trip, such as rocks or tools.  Regularly check to see if handrails are loose or broken. Make sure that both sides of any steps have handrails.  Any raised decks and porches should have guardrails on the edges.  Have any leaves, snow, or ice cleared regularly.  Use sand or salt on walking paths during winter.  Clean up any spills in your garage right away. This includes oil or grease spills. What can I do in the bathroom?  Use night lights.  Install grab bars by the toilet and in the tub and shower. Do not use towel bars as grab bars.  Use non-skid mats or decals in the tub or shower.  If you need to sit down in the shower, use a plastic, non-slip stool.  Keep the floor dry. Clean up any water that spills on the floor as soon as it happens.  Remove soap buildup in the tub or shower regularly.  Attach bath mats securely with double-sided non-slip rug tape.  Do not have throw rugs and other things on the floor that can make you trip. What can I do in the bedroom?  Use night lights.  Make sure that you have  a light by your bed that is easy to reach.  Do not use any sheets or blankets that are too big for your bed. They should not hang down onto the floor.  Have a firm chair that has side arms. You can use this for support while you get dressed.  Do not have throw rugs and other things on the floor that can make you trip. What can I do in the kitchen?  Clean up any spills right away.  Avoid walking on wet floors.  Keep items that you use a lot in easy-to-reach places.  If you need to reach something above you, use a strong step stool that has a grab bar.  Keep electrical cords out of the way.  Do not use floor polish or wax that makes floors slippery. If you must use wax, use non-skid floor wax.  Do not have throw rugs and other things on the floor that can make you trip. What can I do with my stairs?  Do not leave any items on the stairs.  Make sure that there are handrails on both sides of the stairs and use them. Fix handrails that are broken or loose. Make sure that handrails are as long as the stairways.  Check any  carpeting to make sure that it is firmly attached to the stairs. Fix any carpet that is loose or worn.  Avoid having throw rugs at the top or bottom of the stairs. If you do have throw rugs, attach them to the floor with carpet tape.  Make sure that you have a light switch at the top of the stairs and the bottom of the stairs. If you do not have them, ask someone to add them for you. What else can I do to help prevent falls?  Wear shoes that: ? Do not have high heels. ? Have rubber bottoms. ? Are comfortable and fit you well. ? Are closed at the toe. Do not wear sandals.  If you use a stepladder: ? Make sure that it is fully opened. Do not climb a closed stepladder. ? Make sure that both sides of the stepladder are locked into place. ? Ask someone to hold it for you, if possible.  Clearly mark and make sure that you can see: ? Any grab bars or  handrails. ? First and last steps. ? Where the edge of each step is.  Use tools that help you move around (mobility aids) if they are needed. These include: ? Canes. ? Walkers. ? Scooters. ? Crutches.  Turn on the lights when you go into a dark area. Replace any light bulbs as soon as they burn out.  Set up your furniture so you have a clear path. Avoid moving your furniture around.  If any of your floors are uneven, fix them.  If there are any pets around you, be aware of where they are.  Review your medicines with your doctor. Some medicines can make you feel dizzy. This can increase your chance of falling. Ask your doctor what other things that you can do to help prevent falls. This information is not intended to replace advice given to you by your health care provider. Make sure you discuss any questions you have with your health care provider. Document Released: 08/20/2009 Document Revised: 03/31/2016 Document Reviewed: 11/28/2014 Elsevier Interactive Patient Education  2018 [place] Maintenance, Female Adopting a healthy lifestyle and getting preventive care can go a long way to promote health and wellness. Talk with your health care provider about what schedule of regular examinations is right for you. This is a good chance for you to check in with your provider about disease prevention and staying healthy. In between checkups, there are plenty of things you can do on your own. Experts have done a lot of research about which lifestyle changes and preventive measures are most likely to keep you healthy. Ask your health care provider for more information. Weight and diet Eat a healthy diet  Be sure to include plenty of vegetables, fruits, low-fat dairy products, and lean protein.  Do not eat a lot of foods high in solid fats, added sugars, or salt.  Get regular exercise. This is one of the most important things you can do for your health. ? Most adults should  exercise for at least 150 minutes each week. The exercise should increase your heart rate and make you sweat (moderate-intensity exercise). ? Most adults should also do strengthening exercises at least twice a week. This is in addition to the moderate-intensity exercise.  Maintain a healthy weight  Body mass index (BMI) is a measurement that can be used to identify possible weight problems. It estimates body fat based on height and weight. Your health care provider can  help determine your BMI and help you achieve or maintain a healthy weight.  For females 68 years of age and older: ? A BMI below 18.5 is considered underweight. ? A BMI of 18.5 to 24.9 is normal. ? A BMI of 25 to 29.9 is considered overweight. ? A BMI of 30 and above is considered obese.  Watch levels of cholesterol and blood lipids  You should start having your blood tested for lipids and cholesterol at 81 years of age, then have this test every 5 years.  You may need to have your cholesterol levels checked more often if: ? Your lipid or cholesterol levels are high. ? You are older than 81 years of age. ? You are at high risk for heart disease.  Cancer screening Lung Cancer  Lung cancer screening is recommended for adults 36-66 years old who are at high risk for lung cancer because of a history of smoking.  A yearly low-dose CT scan of the lungs is recommended for people who: ? Currently smoke. ? Have quit within the past 15 years. ? Have at least a 30-pack-year history of smoking. A pack year is smoking an average of one pack of cigarettes a day for 1 year.  Yearly screening should continue until it has been 15 years since you quit.  Yearly screening should stop if you develop a health problem that would prevent you from having lung cancer treatment.  Breast Cancer  Practice breast self-awareness. This means understanding how your breasts normally appear and feel.  It also means doing regular breast  self-exams. Let your health care provider know about any changes, no matter how small.  If you are in your 20s or 30s, you should have a clinical breast exam (CBE) by a health care provider every 1-3 years as part of a regular health exam.  If you are 72 or older, have a CBE every year. Also consider having a breast X-ray (mammogram) every year.  If you have a family history of breast cancer, talk to your health care provider about genetic screening.  If you are at high risk for breast cancer, talk to your health care provider about having an MRI and a mammogram every year.  Breast cancer gene (BRCA) assessment is recommended for women who have family members with BRCA-related cancers. BRCA-related cancers include: ? Breast. ? Ovarian. ? Tubal. ? Peritoneal cancers.  Results of the assessment will determine the need for genetic counseling and BRCA1 and BRCA2 testing.  Cervical Cancer Your health care provider may recommend that you be screened regularly for cancer of the pelvic organs (ovaries, uterus, and vagina). This screening involves a pelvic examination, including checking for microscopic changes to the surface of your cervix (Pap test). You may be encouraged to have this screening done every 3 years, beginning at age 58.  For women ages 51-65, health care providers may recommend pelvic exams and Pap testing every 3 years, or they may recommend the Pap and pelvic exam, combined with testing for human papilloma virus (HPV), every 5 years. Some types of HPV increase your risk of cervical cancer. Testing for HPV may also be done on women of any age with unclear Pap test results.  Other health care providers may not recommend any screening for nonpregnant women who are considered low risk for pelvic cancer and who do not have symptoms. Ask your health care provider if a screening pelvic exam is right for you.  If you have had past treatment for  cervical cancer or a condition that could  lead to cancer, you need Pap tests and screening for cancer for at least 20 years after your treatment. If Pap tests have been discontinued, your risk factors (such as having a new sexual partner) need to be reassessed to determine if screening should resume. Some women have medical problems that increase the chance of getting cervical cancer. In these cases, your health care provider may recommend more frequent screening and Pap tests.  Colorectal Cancer  This type of cancer can be detected and often prevented.  Routine colorectal cancer screening usually begins at 81 years of age and continues through 81 years of age.  Your health care provider may recommend screening at an earlier age if you have risk factors for colon cancer.  Your health care provider may also recommend using home test kits to check for hidden blood in the stool.  A small camera at the end of a tube can be used to examine your colon directly (sigmoidoscopy or colonoscopy). This is done to check for the earliest forms of colorectal cancer.  Routine screening usually begins at age 69.  Direct examination of the colon should be repeated every 5-10 years through 81 years of age. However, you may need to be screened more often if early forms of precancerous polyps or small growths are found.  Skin Cancer  Check your skin from head to toe regularly.  Tell your health care provider about any new moles or changes in moles, especially if there is a change in a mole's shape or color.  Also tell your health care provider if you have a mole that is larger than the size of a pencil eraser.  Always use sunscreen. Apply sunscreen liberally and repeatedly throughout the day.  Protect yourself by wearing long sleeves, pants, a wide-brimmed hat, and sunglasses whenever you are outside.  Heart disease, diabetes, and high blood pressure  High blood pressure causes heart disease and increases the risk of stroke. High blood pressure  is more likely to develop in: ? People who have blood pressure in the high end of the normal range (130-139/85-89 mm Hg). ? People who are overweight or obese. ? People who are African American.  If you are 60-17 years of age, have your blood pressure checked every 3-5 years. If you are 67 years of age or older, have your blood pressure checked every year. You should have your blood pressure measured twice-once when you are at a hospital or clinic, and once when you are not at a hospital or clinic. Record the average of the two measurements. To check your blood pressure when you are not at a hospital or clinic, you can use: ? An automated blood pressure machine at a pharmacy. ? A home blood pressure monitor.  If you are between 33 years and 67 years old, ask your health care provider if you should take aspirin to prevent strokes.  Have regular diabetes screenings. This involves taking a blood sample to check your fasting blood sugar level. ? If you are at a normal weight and have a low risk for diabetes, have this test once every three years after 81 years of age. ? If you are overweight and have a high risk for diabetes, consider being tested at a younger age or more often. Preventing infection Hepatitis B  If you have a higher risk for hepatitis B, you should be screened for this virus. You are considered at high risk for hepatitis  B if: ? You were born in a country where hepatitis B is common. Ask your health care provider which countries are considered high risk. ? Your parents were born in a high-risk country, and you have not been immunized against hepatitis B (hepatitis B vaccine). ? You have HIV or AIDS. ? You use needles to inject street drugs. ? You live with someone who has hepatitis B. ? You have had sex with someone who has hepatitis B. ? You get hemodialysis treatment. ? You take certain medicines for conditions, including cancer, organ transplantation, and autoimmune  conditions.  Hepatitis C  Blood testing is recommended for: ? Everyone born from [birth year] through 1963. ? Anyone with known risk factors for hepatitis C.  Sexually transmitted infections (STIs)  You should be screened for sexually transmitted infections (STIs) including gonorrhea and chlamydia if: ? You are sexually active and are younger than 81 years of age. ? You are older than 81 years of age and your health care provider tells you that you are at risk for this type of infection. ? Your sexual activity has changed since you were last screened and you are at an increased risk for chlamydia or gonorrhea. Ask your health care provider if you are at risk.  If you do not have HIV, but are at risk, it may be recommended that you take a prescription medicine daily to prevent HIV infection. This is called pre-exposure prophylaxis (PrEP). You are considered at risk if: ? You are sexually active and do not regularly use condoms or know the HIV status of your partner(s). ? You take drugs by injection. ? You are sexually active with a partner who has HIV.  Talk with your health care provider about whether you are at high risk of being infected with HIV. If you choose to begin PrEP, you should first be tested for HIV. You should then be tested every 3 months for as long as you are taking PrEP. Pregnancy  If you are premenopausal and you may become pregnant, ask your health care provider about preconception counseling.  If you may become pregnant, take 400 to 800 micrograms (mcg) of folic acid every day.  If you want to prevent pregnancy, talk to your health care provider about birth control (contraception). Osteoporosis and menopause  Osteoporosis is a disease in which the bones lose minerals and strength with aging. This can result in serious bone fractures. Your risk for osteoporosis can be identified using a bone density scan.  If you are 47 years of age or older, or if you are at risk for  osteoporosis and fractures, ask your health care provider if you should be screened.  Ask your health care provider whether you should take a calcium or vitamin D supplement to lower your risk for osteoporosis.  Menopause may have certain physical symptoms and risks.  Hormone replacement therapy may reduce some of these symptoms and risks. Talk to your health care provider about whether hormone replacement therapy is right for you. Follow these instructions at home:  Schedule regular health, dental, and eye exams.  Stay current with your immunizations.  Do not use any tobacco products including cigarettes, chewing tobacco, or electronic cigarettes.  If you are pregnant, do not drink alcohol.  If you are breastfeeding, limit how much and how often you drink alcohol.  Limit alcohol intake to no more than 1 drink per day for nonpregnant women. One drink equals 12 ounces of beer, 5 ounces of wine, or  1 ounces of hard liquor.  Do not use street drugs.  Do not share needles.  Ask your health care provider for help if you need support or information about quitting drugs.  Tell your health care provider if you often feel depressed.  Tell your health care provider if you have ever been abused or do not feel safe at home. This information is not intended to replace advice given to you by your health care provider. Make sure you discuss any questions you have with your health care provider. Document Released: 05/09/2011 Document Revised: 03/31/2016 Document Reviewed: 07/28/2015 Elsevier Interactive Patient Education  [employer].

## 2017-04-18 NOTE — Progress Notes (Signed)
I have reviewed and agree with Nurse Donnald Garre documentation   Subjective:   Helen Odom is a 83 y.o. female who presents via wheelchair with Aid for an Initial Medicare Annual Wellness Visit.  Cardiac Risk Factors include: advanced age (>58men, >38 women);sedentary lifestyle     Objective:    Today's Vitals   04/18/17 1127  BP: (!) 160/76  Pulse: 70  Temp: 98.6 F (37 C)  SpO2: 97%  Weight: 166 lb 3.2 oz (75.4 kg)  Height: 5' (1.524 m)  PainSc: 6    Body mass index is 32.46 kg/m.   Current Medications (verified) Outpatient Encounter Prescriptions as of 04/18/2017  Medication Sig  . acetaminophen (TYLENOL) 325 MG tablet Take 2 tablets (650 mg total) by mouth every 6 (six) hours as needed for mild pain (or Fever >/= 101).  Marland Kitchen aspirin EC 81 MG tablet Take 81 mg by mouth daily.  . cholecalciferol (VITAMIN D) 1000 UNITS tablet Take 1,000 Units by mouth daily.  Marland Kitchen esomeprazole (NEXIUM) 20 MG capsule Take 20 mg by mouth daily at 12 noon.  . gabapentin (NEURONTIN) 100 MG capsule Take 1 capsule (100 mg total) by mouth 3 (three) times daily.  . sertraline (ZOLOFT) 50 MG tablet Take 1 tablet (50 mg total) by mouth daily.  . vitamin B-12 (CYANOCOBALAMIN) 1000 MCG tablet Take 1,000 mcg by mouth daily.   No facility-administered encounter medications on file as of 04/18/2017.     Allergies (verified) Flu virus vaccine and Penicillins   History: Past Medical History:  Diagnosis Date  . Arthritis   . Cancer ([place])   . Colon cancer ([place])   . Depression   . Heart murmur   . Hyperlipidemia   . Movement disorder   . Neuropathy   . Osteoporosis   . Vision abnormalities    Past Surgical History:  Procedure Laterality Date  . CATARACT EXTRACTION, BILATERAL Bilateral 2015  . CHOLECYSTECTOMY    . COLON RESECTION    . HIP PINNING,CANNULATED Right 10/13/2016   Procedure: RIGHT HIP PINNING;  Surgeon: Renette Butters, MD;  Location: [facility];  Service: Orthopedics;  Laterality: Right;    Family History  Problem Relation Age of Onset  . Congestive Heart Failure Mother        Deceased at 50 yo  . CAD Father   . Heart attack Father 31       Deceased  . Atrial fibrillation Brother    Social History   Occupational History  . Not on file.   Social History Main Topics  . Smoking status: Never Smoker  . Smokeless tobacco: Never Used  . Alcohol use 0.0 oz/week     Comment: [place] about once per month  . Drug use: No  . Sexual activity: No    Tobacco Counseling Counseling given: Yes Patient has never smoked and has no plans to start.   Activities of Daily Living In your present state of health, do you have any difficulty performing the following activities: 04/18/2017  Hearing? N  Vision? Y  Difficulty concentrating or making decisions? N  Walking or climbing stairs? N  Dressing or bathing? N  Doing errands, shopping? N  Preparing Food and eating ? N  Using the Toilet? Y  In the past six months, have you accidently leaked urine? N  Do you have problems with loss of bowel control? N  Managing your Medications? N  Managing your Finances? N  Housekeeping or managing your Housekeeping? N  Some  recent data might be hidden   Home Safety:  My home has a working smoke alarm:  Yes in every room plus sprinklers           My home throw rugs have been fastened down to the floor or removed:  Removed I have a non-slip surface or non-slip mats in the bathtub and shower:  Yes plus shower chair and grab bars for tub and toilet        All my home's stairs have handrails, including any outdoor stairs  Second floor apt with elevator access        My home's floors, stairs and hallways are free from clutter, wires and cords:  Yes     I have animals in my home  No I wear seatbelts consistently:  Yes   Immunizations and Health Maintenance  There is no immunization history on file for this patient. Health Maintenance Due  Topic Date Due  . DTaP/Tdap/Td (1 - Tdap)  10/21/1950  . TETANUS/TDAP  10/21/1950  . PNA vac Low Risk Adult (1 of 2 - PCV13) 10/21/1998  Patient thinks she received pneumonia and TDaP vaccines while in [place] in 2014.  Patient Care Team: Lind Covert, MD as PCP - General (Family Medicine) Melvenia Beam, MD as Consulting Physician (Neurology) Renette Butters, MD as Attending Physician (Orthopedic Surgery)  Indicate any recent Medical Services you may have received from other than [facility] providers in the past year (date may be approximate).     Assessment:   This is a routine wellness examination for Kayelynn.   Hearing/Vision screen Patient able to detect all sounds at 40 dBHL   Dietary issues and exercise activities discussed: Current Exercise Habits: The patient does not participate in regular exercise at present, Exercise limited by: orthopedic condition(s)  Goals    . Blood Pressure < 140/90    . Walk (pt-stated)     This is difficult for patient to do as she needs assistance while using walker; has difficulty turning left leg properly.  Depression Screen PHQ 2/9 Scores 04/18/2017 09/14/2016 06/22/2016 05/25/2016 10/14/2014  PHQ - 2 Score 2 4 0 0 0  PHQ- 9 Score 14 14 - - -  Encouraged patient to meet with IC/BH. Offered to have [facility] consultant call patient but declined since she has little privacy in her home with 24 hour aids.   Fall Risk Fall Risk  04/18/2017 12/21/2016 09/14/2016 06/22/2016 05/25/2016  Falls in the past year? Yes Yes Yes No No  Number falls in past yr: 2 or more 1 2 or more - 1  Injury with Fall? Yes Yes Yes - -  Risk Factor Category  High Fall Risk - High Fall Risk - -  Risk for fall due to : History of fall(s);Impaired balance/gait;Impaired mobility - History of fall(s) - -  Follow up Falls evaluation completed;Education provided;Falls prevention discussed - - - -   TUG Test:  Unable to perform as patient is in wheelchair. Falls prevention discussed in detail and literature given.  Cognitive  Function: Mini-Cog  Passed with score 3/5   Screening Tests Health Maintenance  Topic Date Due  . DTaP/Tdap/Td (1 - Tdap) 10/21/1950  . TETANUS/TDAP  10/21/1952  . PNA vac Low Risk Adult (1 of 2 - PCV13) 10/21/1998  . DEXA SCAN  Completed      Plan:    Encouraged patient to meet with IC/BH for PHQ-9 score of 14. Offered to have [facility] consultant call patient but  declined since she has little privacy in her home with 24 hour aids.  Patient requesting refill on hydrocodone 5/325 mg. Has 2 tabs left from Rx written 10/13/2016. States 1 tab really makes a difference especially if going out for the day. Will send request to PCP.  I have personally reviewed and noted the following in the patient's chart:   . Medical and social history . Use of alcohol, tobacco or illicit drugs  . Current medications and supplements . Functional ability and status . Nutritional status . Physical activity . Advanced directives . List of other physicians . Hospitalizations, surgeries, and ER visits in previous 12 months . Vitals . Screenings to include cognitive, depression, and falls . Referrals and appointments  In addition, I have reviewed and discussed with patient certain preventive protocols, quality metrics, and best practice recommendations. A written personalized care plan for preventive services as well as general preventive health recommendations were provided to patient.     Velora Heckler, RN   04/18/2017

## 2017-04-19 ENCOUNTER — Encounter: Payer: Self-pay | Admitting: *Deleted

## 2017-04-27 ENCOUNTER — Encounter: Payer: Self-pay | Admitting: *Deleted

## 2017-05-24 ENCOUNTER — Encounter: Payer: Self-pay | Admitting: Family Medicine

## 2017-05-24 ENCOUNTER — Encounter: Payer: Self-pay | Admitting: Licensed Clinical Social Worker

## 2017-05-24 ENCOUNTER — Ambulatory Visit (INDEPENDENT_AMBULATORY_CARE_PROVIDER_SITE_OTHER): Payer: Medicare Other | Admitting: Family Medicine

## 2017-05-24 DIAGNOSIS — M79641 Pain in right hand: Secondary | ICD-10-CM

## 2017-05-24 DIAGNOSIS — R269 Unspecified abnormalities of gait and mobility: Secondary | ICD-10-CM | POA: Diagnosis present

## 2017-05-24 DIAGNOSIS — M24541 Contracture, right hand: Secondary | ICD-10-CM | POA: Insufficient documentation

## 2017-05-24 MED ORDER — HYDROCODONE-ACETAMINOPHEN 5-325 MG PO TABS: 0.5000 | ORAL_TABLET | Freq: Three times a day (TID) | ORAL | 0 refills | Status: DC | PRN

## 2017-05-24 MED ORDER — DICLOFENAC SODIUM 1 % TD GEL: 2.0000 g | Freq: Four times a day (QID) | TRANSDERMAL | 11 refills | Status: DC

## 2017-05-24 NOTE — Patient Instructions (Addendum)
Good to see you today!  Thanks for coming in.  Try the hydrocodone as needed 1/2 tab up to twice a day  Continue the tylenol  Use diclofenac or aspercreme on your hands 4 x a day   Exercise the han regularly  I will refer you to Physical Therapy  Come back in one month

## 2017-05-24 NOTE — Assessment & Plan Note (Signed)
Not improving.  Will refer to Physical Therapy.  Will also prescribe low dose narcotic to help with function

## 2017-05-24 NOTE — Progress Notes (Addendum)
Total time:20 minutes Type of Service: [place] warm handoff  Interpretor:No.    SUBJECTIVE: Helen Odom is a 83 y.o. female. Patient was referred by Dr. Erin Hearing for: Stress at home.  She was accompanied by her niece Rod Holler.  Patient is very pleasant and engaged in conversation.  She reports the following concerns: stress related to her husband's dementia. Past two weeks she has noticed progressive changes in his behavior, etc. Which is causing more stress for her.  LIFE CONTEXT:  Family & Social: lives with her husband (no children), niece Rod Holler is HPOA and takes her to appointments.  Patient and husband have 24 hour caregivers. She enjoys shopping on line and tries to get out of the apartment 1 every other week.  Life changes: progressive dementia symptoms for husband.  GOALS:  Patient will reduce symptoms of: stress, , and increase knowledge and ability QM:GQQPYP skills, self-management skills and stress reduction,   Intervention: Reflective listening, Psychoeducation and Supportive Counseling.   Issues discussed: self care, understanding the progression of dementia, family support, and things patient enjoys doing.     ASSESSMENT:Patient currently experiencing stress related to the progression of husband dementia and managing stress associated with these changes .  Patient may benefit from, and is in agreement to receive brief therapeutic interventions to assist with managing her stress.   PLAN: 1. Patient will meet with LCSW after next visit with PCP 2. Patient will review the sections of the "Caring for a Person Dementia handbook" 3. Patient will call LCSW if she needs to talk.  Warm Hand Off Completed.     Casimer Lanius, LCSW Licensed Clinical Social Worker [facility]   [phone] 12:33 PM

## 2017-05-24 NOTE — Assessment & Plan Note (Signed)
Seems mostly consistent with djd flare.  Not consistent with gout or infection or trauma.  Will refer to Physical Therapy.  If not improving may need imaging. Wll use combination analgesics with tylenol, topical diclofenac and low dose narcotic

## 2017-05-24 NOTE — Progress Notes (Signed)
Subjective  Patient is presenting with the following illnesses  R hand pain Has been hurting and cramping more for weeks.  Interferes with her ability to stand.  Difficult to fully open her hand.  No specific trauma.  Uses tylenol but does not help much.   No redness or soft tissue swelling or new pain in other joints or rash  Gait Can get to stand only with assistance.  Able to walk some but has pain in R hip and foot and now hand.  No recent falls.  No focal weakness   Depression Stress - feeling very stressed due to her husband's worsening dementia .   Chief Complaint noted Review of Symptoms - see HPI PMH - Smoking status noted.     Objective Vital Signs reviewed R hand - has pain with extension of all fingers.  Has full passive ROM.  Normal strength.  No focal redness or significant soft tissue swelling.  No trigger fingers Good range of motion without pain in R shoulder and elbow and wrist Unable to stand or pull to a stand    Assessments/Plans  No problem-specific Assessment & Plan notes found for this encounter.   See Encounter view if individual problem A/Ps not visible See after visit summary for details of patient instuctions

## 2017-06-21 ENCOUNTER — Ambulatory Visit (INDEPENDENT_AMBULATORY_CARE_PROVIDER_SITE_OTHER): Payer: Medicare Other | Admitting: Licensed Clinical Social Worker

## 2017-06-21 ENCOUNTER — Encounter: Payer: Self-pay | Admitting: Family Medicine

## 2017-06-21 ENCOUNTER — Telehealth: Payer: Self-pay | Admitting: Neurology

## 2017-06-21 ENCOUNTER — Ambulatory Visit (INDEPENDENT_AMBULATORY_CARE_PROVIDER_SITE_OTHER): Payer: Medicare Other | Admitting: Family Medicine

## 2017-06-21 DIAGNOSIS — F3289 Other specified depressive episodes: Secondary | ICD-10-CM | POA: Diagnosis not present

## 2017-06-21 DIAGNOSIS — M79641 Pain in right hand: Secondary | ICD-10-CM

## 2017-06-21 MED ORDER — HYDROCODONE-ACETAMINOPHEN 5-325 MG PO TABS: 0.5000 | ORAL_TABLET | Freq: Three times a day (TID) | ORAL | 0 refills | Status: DC | PRN

## 2017-06-21 NOTE — Telephone Encounter (Signed)
Pt was told by one of her Dr's to f/u with Dr Jaynee Eagles re: her parkinsonism getting worse.  She agreed to 1st available appointment and the wait list.  She'd like a call from RN if there is a way to be seen a lot sooner, if so please call

## 2017-06-21 NOTE — Assessment & Plan Note (Signed)
Worsened now with some contracture.  Has not started Physical Therapy yet.  I think some of her pain is due to DJD and some to stiffness with contracture that is partially due to her movement do.    See after visit summary

## 2017-06-21 NOTE — Progress Notes (Signed)
Subjective  Patient is presenting with the following illnesses  R Hand Pain Worsened.  Has trouble extening it now.  Pain is bad unless takes one whole norco. Using apercreme and massage twice a day.  No redness or focal pain or trauma or rash. She feels the left hand is beginning to act the same way   Depression Feels some better.  No suicidal ideation.  Has new living situation. Appetite is stable.  Taking zoloft without problems    Chief Complaint noted Review of Symptoms - see HPI PMH - Smoking status noted.     Objective Vital Signs reviewed R Hand - medial three fingers are contracted.  I can slowly extend to about half way with gentle traction. L hand can fully extend but with pain in forearm  Limited face movement with resting intermittent tremor     Assessments/Plans  No problem-specific Assessment & Plan notes found for this encounter.   See Encounter view if individual problem A/Ps not visible See after visit summary for details of patient instuctions

## 2017-06-21 NOTE — Patient Instructions (Signed)
Good to see you today!  Thanks for coming in.  See Physical Therapy there for 1-2 times if not doing anything you can't at home then I will order home Physical Therapy  Using a warm cloth and massage and aspercreme open hand as far as possible slowly 4 x a day   I would follow up with neurology Dr Jaynee Eagles  to see if other medications for movement or possible injection for the R hand   Come back in one month to check your hand

## 2017-06-21 NOTE — Progress Notes (Addendum)
  Total time:30 Minutes Type of Service: [place] office visit Interpretor:No.    SUBJECTIVE: Helen Odom is a 83 y.o. female. Patient was referred by Dr. Erin Hearing for: Stress at home related to her husband' dementia.  She was accompanied by her caregiver Suanne Marker.  Patient is very pleasant and engaged in conversation.  She utilize information received from LCSW during last visit and it was helpful. Patient's care takers read "Caring for a person with Alzheimer" and this has assisted with less stress on patient.     LIFE CONTEXT:  Family & Social: lives with her husband (no children), niece Rod Holler is HPOA and takes her to appointments.  Patient and husband have 24 hour caregivers. She enjoys shopping on line and tries to get out of the apartment every other week.  Life changes: progressive dementia symptoms for husband.  GOALS:  Patient will reduce symptoms of: stress, , and increase knowledge and ability W[place]:PXTGGY skills, self-management skills and stress reduction,   Intervention: Reflective listening, Psychoeducation and Supportive Counseling.   Issues discussed: self care, barriers to getting out more, difficulty with getting in and out of her automobile, options for this barrier, things patient wants to do, ways to assist with husband's care, meeting husband where he is.     ASSESSMENT:Patient currently experiencing stress related to the progression of husband dementia and managing stress associated with these changes.  Patient has made progress with managing these stressor by not only educating her self but also her home care aids.  Patient requested and LCSW provided additional books to share with each of her care givers.  LCSW spoke with PCP about patient concerns and barriers to getting out more.     PLAN: 1. Patient will meet with LCSW after next visit with PCP 2. Patient will give each of the care takers a "Caring for a Person with Dementia handbook" 3. Patient  will call LCSW if she needs to talk.  Casimer Lanius, LCSW Licensed Clinical Social Worker [facility]   [phone] 1:58 PM

## 2017-06-21 NOTE — Assessment & Plan Note (Signed)
Stable.  Continue to see IC and take SSRI

## 2017-06-22 NOTE — Telephone Encounter (Signed)
Sooner appt scheduled 07/19/17.

## 2017-06-23 ENCOUNTER — Telehealth: Payer: Self-pay | Admitting: Family Medicine

## 2017-06-23 NOTE — Telephone Encounter (Signed)
Spoke with her about Physical Therapy and getting out of car  Suggested they should address this at her visit.  If not to let me know and I will add another referral  She agreed

## 2017-06-26 ENCOUNTER — Ambulatory Visit: Payer: Medicare Other | Admitting: Occupational Therapy

## 2017-06-28 ENCOUNTER — Ambulatory Visit: Payer: Medicare Other | Attending: Family Medicine | Admitting: Occupational Therapy

## 2017-06-28 DIAGNOSIS — R29818 Other symptoms and signs involving the nervous system: Secondary | ICD-10-CM | POA: Insufficient documentation

## 2017-06-28 DIAGNOSIS — R29898 Other symptoms and signs involving the musculoskeletal system: Secondary | ICD-10-CM | POA: Diagnosis not present

## 2017-06-28 DIAGNOSIS — M25641 Stiffness of right hand, not elsewhere classified: Secondary | ICD-10-CM | POA: Diagnosis not present

## 2017-06-28 DIAGNOSIS — R293 Abnormal posture: Secondary | ICD-10-CM

## 2017-06-28 DIAGNOSIS — M25541 Pain in joints of right hand: Secondary | ICD-10-CM | POA: Diagnosis not present

## 2017-06-28 NOTE — Therapy (Signed)
[facility] [address] [place], [place], [address] Phone: [phone]   Fax:  [phone]  Occupational Therapy Evaluation  Patient Details  Name: Helen Odom MRN: [id number] Date of Birth: 1930/12/30 Referring Provider: Dr. Talbert Cage  Encounter Date: 06/28/2017      OT End of Session - 06/28/17 1415    Visit Number 1   Number of Visits 8   Date for OT Re-Evaluation 07/29/17   Authorization Type MCR, BC/BS   Authorization - Visit Number 1   Authorization - Number of Visits 10   OT Start Time 1300   OT Stop Time 1400   OT Time Calculation (min) 60 min   Activity Tolerance Patient limited by pain      Past Medical History:  Diagnosis Date  . Arthritis   . Cancer ([place])   . Colon cancer ([place])   . Depression   . Heart murmur   . Hyperlipidemia   . Movement disorder   . Neuropathy   . Osteoporosis   . Vision abnormalities     Past Surgical History:  Procedure Laterality Date  . CATARACT EXTRACTION, BILATERAL Bilateral 2015  . CHOLECYSTECTOMY    . COLON RESECTION    . HIP PINNING,CANNULATED Right 10/13/2016   Procedure: RIGHT HIP PINNING;  Surgeon: Renette Butters, MD;  Location: [facility];  Service: Orthopedics;  Laterality: Right;    There were no vitals filed for this visit.      Subjective Assessment - 06/28/17 1301    Patient is accompained by: --  caregiver   Pertinent History PD, OA, Tennis elbow Lt, Rt hip surgery 10/2016   Patient Stated Goals no pain   Currently in Pain? Yes   Pain Score 7    Pain Location Hand   Pain Orientation Right   Pain Descriptors / Indicators Aching   Pain Type Acute pain   Pain Onset 1 to 4 weeks ago   Pain Frequency Constant   Aggravating Factors  trying to extend MP's    Pain Relieving Factors heat, topical rubs           [facility] OT Assessment - 06/28/17 0001      Assessment   Diagnosis Rt hand pain  DJD with stiffness Rt hand    Referring Provider Dr. Talbert Cage   Onset Date --  worsening over several months, increased last 2 weeks   Assessment Pt with swelling MP joints Rt hand   Prior Therapy none for this     Precautions   Precautions Fall     Balance Screen   Has the patient fallen in the past 6 months No   Has the patient had a decrease in activity level because of a fear of falling?  Yes   Is the patient reluctant to leave their home because of a fear of falling?  Yes     Home  Environment   Additional Comments Lives in ground level apt with 24 hr. caregivers (For pt and husband; husband has dementia). DME: w/c, elevated toilet seat, walker, grab bars, stand-pivot device   Lives With Spouse     Prior Function   Level of Independence Needs assistance with ADLs   Vocation Retired     ADL   Eating/Feeding Needs assist with cutting food   Grooming --  needs assist with combing hair, set up for other tasks   Upper Body Bathing --  dependent in shower, mod assist sponge bathing  Lower Body Bathing + 1 Total assistance   Upper Body Dressing Maximal assistance  usually wears dresses   Lower Body Dressing +1 Total aassistance   Toilet Transfer Maximal assistance  w/ DME (stand pole and swivel disc for feet), elevated seat   Toileting - Clothing Manipulation Maximal assistance   Toileting -  Hygiene Modified Independent   Tub/Shower Transfer + 1 Total assistance   ADL comments Dependent for IADLS     Mobility   Mobility Status Needs assist   Mobility Status Comments W/C dependent, can stand for transfers with assist     Written Expression   Dominant Hand Right   Handwriting Not legible  for 3-4 years per pt report     Vision - History   Baseline Vision Wears glasses only for reading   Visual History --  macular hole Rt eye     Cognition   Overall Cognitive Status Difficult to assess  poor historian, but caregiver reports memory intact     Sensation   Light Touch Appears Intact      Coordination   Fine Motor Movements are Fluid and Coordinated No  limited by ROM   Coordination Pt can pick up pen b/t 1st 2 digits but unable to manipulate in Rt hand. Pt can pick up and manipulate pen in Lt hand     Edema   Edema noted significant swelling Rt MP joints     ROM / Strength   AROM / PROM / Strength AROM     AROM   Overall AROM Comments RUE: sh. flex to 100*, ER 75%, IR 90%, Full elbow flex/ext, pron, sup 75%, wrist flex = 50*, ext = 35*. Rt hand limited in extension primarily digits 3-5 at MP's, and digits 3-4 at PIP's as well (see below for details). LUE: Sh flex = 90*, ER 75%, IR 90%, Full elbow flex, ext, and pronation, sup 90% wrist flex/ext WFL's, gross finger flex/ext WFL's     Right Hand AROM   R Index  MCP 0-90 --  -35*   R Long  MCP 0-90 --  -75*   R Long PIP 0-100 --  -50*   R Ring  MCP 0-90 --  -85*   R Ring PIP 0-100 --  -60*   R Little  MCP 0-90 --  -70*     Hand Function   Right Hand Grip (lbs) 0 lbs   Left Hand Grip (lbs) 10 lbs                              OT Long Term Goals - 06/28/17 1422      OT LONG TERM GOAL #1   Title Pt/caregiver independent with HEP for BUE's    Time 4   Period Weeks   Status New   Target Date 07/29/17     OT LONG TERM GOAL #2   Title Pt/caregiver independent with splint wear and care   Time 4   Period Weeks   Status New     OT LONG TERM GOAL #3   Title Pt to tolerate P/ROM in extension Rt hand with pain no greater than 5/10   Time 4   Period Weeks   Status New               Plan - 06/28/17 1416    Clinical Impression Statement Pt is a 83 y.o. female who presents to outpatient rehab with  Rt hand pain, DJD with stiffness Rt hand, and pain with passive extension Rt hand digits 3-5. Pt also with PMH: Parkinsonism, Lt tennis elbow, Rt hip surgery, Lt shoulder surgery. Pt reports unable to walk since hip surgery and relies soley on caregivers for transfers.    Occupational  Profile and client history currently impacting functional performance SEE ABOVE   Occupational performance deficits (Please refer to evaluation for details): ADL's;Social Participation   Rehab Potential Fair   Current Impairments/barriers affecting progress: Pt's PLOF limited by PMH, time since onset   OT Frequency 2x / week  (will most likely only see 1x/wk d/t difficulty arranging caregivers for transportation    OT Duration 4 weeks   OT Treatment/Interventions Self-care/ADL training;Moist Heat;Fluidtherapy;DME and/or AE instruction;Splinting;Patient/family education;Therapeutic activities;Therapeutic exercises;Neuromuscular education;Functional Mobility Training;Passive range of motion;Parrafin;Electrical Stimulation;Manual Therapy   Plan begin splinting for Rt hand   Clinical Decision Making Several treatment options, min-mod task modification necessary   Consulted and Agree with Plan of Care Patient;Other (Comment)  caregiver      Patient will benefit from skilled therapeutic intervention in order to improve the following deficits and impairments:  Decreased coordination, Decreased range of motion, Difficulty walking, Impaired flexibility, Improper body mechanics, Decreased endurance, Improper spinal/pelvic alignment, Decreased activity tolerance, Impaired UE functional use, Pain, Decreased knowledge of use of DME, Decreased mobility, Decreased strength  Visit Diagnosis: Pain in joints of right hand - Plan: Ot plan of care cert/re-cert  Stiffness of right hand, not elsewhere classified - Plan: Ot plan of care cert/re-cert  Abnormal posture - Plan: Ot plan of care cert/re-cert  Other symptoms and signs involving the musculoskeletal system - Plan: Ot plan of care cert/re-cert  Other symptoms and signs involving the nervous system - Plan: Ot plan of care cert/re-cert      G-Codes - 11/94/17 1423    Functional Assessment Tool Used (Outpatient only) Rt hand positioning/limited A/ROM in  extension digits 3-5   Functional Limitation Changing and maintaining body position   Changing and Maintaining Body Position Current Status (E0814) At least 80 percent but less than 100 percent impaired, limited or restricted   Changing and Maintaining Body Position Goal Status (G8185) At least 60 percent but less than 80 percent impaired, limited or restricted      Problem List Patient Active Problem List   Diagnosis Date Noted  . Right hand pain 05/24/2017  . Closed fracture of right hip ([place]) 10/11/2016  . Obesity 06/23/2016  . Abnormality of gait 05/25/2016  . Pedal edema 05/25/2016  . Age-related osteoporosis with current pathological fracture 10/14/2014  . Depression 10/14/2014  . Lumbar spondylosis 10/14/2014  . DJD (degenerative joint disease), multiple sites 10/14/2014  . Tremor 10/14/2014  . Macular hole 10/14/2014    Carey Bullocks, OTR/L 06/28/2017, 2:26 PM  [facility] [address] [place], [place], [address] Phone: [phone]   Fax:  [phone]  Name: Helen Odom MRN: [id number] Date of Birth: 06-Feb-1931

## 2017-07-05 ENCOUNTER — Ambulatory Visit: Payer: Medicare Other | Admitting: Occupational Therapy

## 2017-07-12 ENCOUNTER — Ambulatory Visit (INDEPENDENT_AMBULATORY_CARE_PROVIDER_SITE_OTHER): Payer: Medicare Other | Admitting: Licensed Clinical Social Worker

## 2017-07-12 ENCOUNTER — Ambulatory Visit (INDEPENDENT_AMBULATORY_CARE_PROVIDER_SITE_OTHER): Payer: Medicare Other | Admitting: Family Medicine

## 2017-07-12 ENCOUNTER — Encounter: Payer: Self-pay | Admitting: Family Medicine

## 2017-07-12 ENCOUNTER — Ambulatory Visit: Payer: Medicare Other | Attending: Family Medicine | Admitting: Occupational Therapy

## 2017-07-12 DIAGNOSIS — M25641 Stiffness of right hand, not elsewhere classified: Secondary | ICD-10-CM | POA: Diagnosis not present

## 2017-07-12 DIAGNOSIS — M810 Age-related osteoporosis without current pathological fracture: Secondary | ICD-10-CM | POA: Diagnosis not present

## 2017-07-12 DIAGNOSIS — M25541 Pain in joints of right hand: Secondary | ICD-10-CM | POA: Insufficient documentation

## 2017-07-12 DIAGNOSIS — F3289 Other specified depressive episodes: Secondary | ICD-10-CM

## 2017-07-12 DIAGNOSIS — M79641 Pain in right hand: Secondary | ICD-10-CM | POA: Diagnosis not present

## 2017-07-12 DIAGNOSIS — R29898 Other symptoms and signs involving the musculoskeletal system: Secondary | ICD-10-CM | POA: Insufficient documentation

## 2017-07-12 DIAGNOSIS — R29818 Other symptoms and signs involving the nervous system: Secondary | ICD-10-CM | POA: Insufficient documentation

## 2017-07-12 MED ORDER — ALENDRONATE SODIUM 70 MG PO TABS: 70.0000 mg | ORAL_TABLET | ORAL | 11 refills | Status: DC

## 2017-07-12 MED ORDER — HYDROCODONE-ACETAMINOPHEN 5-325 MG PO TABS: 0.5000 | ORAL_TABLET | Freq: Three times a day (TID) | ORAL | 0 refills | Status: DC | PRN

## 2017-07-12 NOTE — Progress Notes (Addendum)
  Total time:30 Minutes Type of Service: [place] office visit Interpretor:No.    SUBJECTIVE: Helen Odom is a 83 y.o. female. Patient was referred by Dr. Erin Hearing for: Stress at home related to her husband's dementia.  She was accompanied by her caregiver Suanne Marker.  Patient is very pleasant and engaged in conversation, however tearful at times.  Reports PCP has increased her zoloft.  Patient reports information received about "Caring for a person with Alzheimer" continues to be a great help with husband.   LIFE CONTEXT:  Family & Social: lives with her husband (no children), niece Rod Holler is HPOA.  Patient and husband have 24 hour caregivers.  Life changes: dementia symptoms continue to progress for husband. (less mobile and incontinence )  GOALS:  Patient will reduce symptoms of: stress, and symptoms of depression, increase knowledge and ability VQ:MGQQPY skills, self-management skills and stress reduction,   Intervention: Reflective listening,Psychoeducation, behavioral activation, community resources and supportive counseling.    Issues discussed: self care action plan, barriers to getting out more,  things patient enjoys doing, PACE and alzheimer's support group, husband's care and managing changes , meeting husband where he is and her new automobile that will help with getting in and out of the vehicle.       ASSESSMENT:Patient currently experiencing stress and symptoms of depression related to the progression of husband dementia and managing stress associated with these changes.  Patient has made progress with managing these stressors and could benefit from continued interventions.     PLAN: 1. Patient will meet with LCSW next week 2. Patient will work on self-care action plan 3. Patient will contact the PACE program to obtain information. 4. Aide will go to support groups and obtain information for patient.    Casimer Lanius, LCSW Licensed Clinical Social  Worker [facility]   [phone] 1:11 PM

## 2017-07-12 NOTE — Patient Instructions (Addendum)
  Good to see you today!  Thanks for coming in.  Take Zoloft one and a half a day for 10 days if tolerating ok then take 2 tabs a day  Try the fosamax once a week  Talk to Dr Jaynee Eagles about the hand and parkinsons  Come back if things are worsening or when you need another refill of hydrocodone in 1-2 months

## 2017-07-12 NOTE — Assessment & Plan Note (Signed)
Not improving.  Follow up with neuro Continue home exercises and Physical Therapy

## 2017-07-12 NOTE — Assessment & Plan Note (Signed)
Worsening.  Increase zoloft and continue IC counseling

## 2017-07-12 NOTE — Patient Instructions (Signed)
SHOULDER: Flexion - Supine (Cane)    Hold cane in both hands. Raise arms up overhead. Do not allow back to arch. Hold _5__ seconds. _10__ reps per set, 2x/day   ROM: Horizontal Abduction / Adduction - Wand    Keeping both palms down, push right hand across body with other hand. Then pull back across body, keeping arms parallel to floor. Do not allow trunk to twist. Hold _5___ seconds. Repeat _10___ times per set. Do __1__ sets per session. Do __2__ sessions per day.   ROM: External Rotation - Wand (Supine)    Lie on back holding wand with elbows bent to 90. Rotate forearms over head as far as possible.  Repeat __10__ times per set. Do _1___ sets per session. Do __2__ sessions per day.    CONTINUE TO STRETCH HAND GENTLY (KEEPING JOINTS IN ALIGNMENT). HOLD 20 SEC. REPEAT 5 TIMES, APPROX. EVERY 2 HOURS (6-8 TIMES/DAY)

## 2017-07-12 NOTE — Therapy (Signed)
[facility] [address] [place], [place], [address] Phone: [phone]   Fax:  [phone]  Occupational Therapy Treatment  Patient Details  Name: Helen Odom MRN: [id number] Date of Birth: 1931/02/24 Referring Provider: Dr. Talbert Cage  Encounter Date: 07/12/2017      OT End of Session - 07/12/17 1419    Visit Number 2   Number of Visits 8   Date for OT Re-Evaluation 07/29/17   Authorization Type MCR, BC/BS   Authorization - Visit Number 2   Authorization - Number of Visits 10   OT Start Time 0370   OT Stop Time 1410   OT Time Calculation (min) 53 min   Activity Tolerance Patient tolerated treatment well      Past Medical History:  Diagnosis Date  . Arthritis   . Cancer ([facility])   . Colon cancer ([place] [place])   . Depression   . Heart murmur   . Hyperlipidemia   . Movement disorder   . Neuropathy   . Osteoporosis   . Vision abnormalities     Past Surgical History:  Procedure Laterality Date  . CATARACT EXTRACTION, BILATERAL Bilateral 2015  . CHOLECYSTECTOMY    . COLON RESECTION    . HIP PINNING,CANNULATED Right 10/13/2016   Procedure: RIGHT HIP PINNING;  Surgeon: Renette Butters, MD;  Location: [facility];  Service: Orthopedics;  Laterality: Right;    There were no vitals filed for this visit.      Subjective Assessment - 07/12/17 1408    Subjective  I don't think I would wear the splint. I don't think I would tolerate it (re: fabricated splint)   Patient is accompained by: --  caregiver   Pertinent History PD, OA, Tennis elbow Lt, Rt hip surgery 10/2016   Patient Stated Goals no pain   Currently in Pain? Yes   Pain Score 9   up to 9/10 with PROM   Pain Location Hand   Pain Orientation Right   Pain Descriptors / Indicators Aching   Pain Type Acute pain   Pain Onset More than a month ago   Pain Frequency Intermittent   Aggravating Factors  trying to extend MP's    Pain Relieving Factors cold  (per pt report)                       OT Treatments/Exercises (OP) - 07/12/17 0001      ADLs   ADL Comments Pt did not wish to pursue having therapist fabricate splint and stated she would probably not wear it because she couldn't tolerate it. Therapist explained benefits of splint, however pt still declined. Pt however was agreeable to pre-fab splint that had softer cushion/lining and that kept hand in more flexion (compared to a fabricated one). Therapist made recommendations for pre-fab splint and provided handout - pt wishes to pursue further on her own.  Pt also wanted to d/c from outpatient therapy today secondary to extreme difficulty getting here. Pt reports it takes 2 caregivers to help her and then no one is available to stay with her husband who has dementia. Pt has already discussed getting home health OT/PT with MD today (at earlier appt) and is pursing this. Discussed proper stretching for Rt hand including gently and proper alignment of MP and PIP joints. Discussed recommended frequency and holding for 20 sec.      Exercises   Exercises Shoulder     Shoulder Exercises: ROM/Strengthening  Other ROM/Strengthening Exercises Pt issued cane HEP for BUE's - recommended pt perform in supine for increased comfort and improved ROM (See pt instructions).                 OT Education - 2017-07-17 1402    Education provided Yes   Education Details HEP for bilateral UE's, recommendations for pre-fab splint, proper technique for stretching Rt hand   Person(s) Educated Patient;Caregiver(s)   Methods Explanation;Handout;Demonstration   Comprehension Verbalized understanding;Returned demonstration             OT Long Term Goals - 07/17/2017 1420      OT LONG TERM GOAL #1   Title Pt/caregiver independent with HEP for BUE's    Time 4   Period Weeks   Status Achieved     OT LONG TERM GOAL #2   Title Pt/caregiver independent with splint wear and care   Time 4    Period Weeks   Status Deferred  Pt does not want fabricated splint. Pt will most likely purchase recommended pre-fab splint for Rt hand     OT LONG TERM GOAL #3   Title Pt to tolerate P/ROM in extension Rt hand with pain no greater than 5/10   Time 4   Period Weeks   Status Not Met  can go up to 9/10 pain               Plan - 2017-07-17 1422    Clinical Impression Statement Pt requests to be d/c today due to difficulty getting to outpatient rehab. Pt/caregiver verbalizes understanding with education and recommendations provided today.    Current Impairments/barriers affecting progress: Pt's PLOF limited by PMH, time since onset   OT Treatment/Interventions Self-care/ADL training;Moist Heat;Fluidtherapy;DME and/or AE instruction;Splinting;Patient/family education;Therapeutic activities;Therapeutic exercises;Neuromuscular education;Functional Mobility Training;Passive range of motion;Parrafin;Electrical Stimulation;Manual Therapy   Plan D/C OT      Patient will benefit from skilled therapeutic intervention in order to improve the following deficits and impairments:  Decreased coordination, Decreased range of motion, Difficulty walking, Impaired flexibility, Improper body mechanics, Decreased endurance, Improper spinal/pelvic alignment, Decreased activity tolerance, Impaired UE functional use, Pain, Decreased knowledge of use of DME, Decreased mobility, Decreased strength  Visit Diagnosis: Pain in joints of right hand  Stiffness of right hand, not elsewhere classified  Other symptoms and signs involving the musculoskeletal system  Other symptoms and signs involving the nervous system      G-Codes - 07-17-2017 1424    Functional Assessment Tool Used (Outpatient only) Rt hand positioning/limited A/ROM in extension digits 3-5   Functional Limitation Changing and maintaining body position   Changing and Maintaining Body Position Goal Status (D9833) At least 60 percent but less than  80 percent impaired, limited or restricted   Changing and Maintaining Body Position Discharge Status (A2505) At least 80 percent but less than 100 percent impaired, limited or restricted      Problem List Patient Active Problem List   Diagnosis Date Noted  . Right hand pain 05/24/2017  . Obesity 06/23/2016  . Abnormality of gait 05/25/2016  . Pedal edema 05/25/2016  . Osteoporosis 10/14/2014  . Depression 10/14/2014  . Lumbar spondylosis 10/14/2014  . DJD (degenerative joint disease), multiple sites 10/14/2014  . Tremor 10/14/2014  . Macular hole 10/14/2014   OCCUPATIONAL THERAPY DISCHARGE SUMMARY  Visits from Start of Care: 2  Current functional level related to goals / functional outcomes: SEE ABOVE   Remaining deficits: Same as initial evaluation   Education / Equipment: Splint  recommendations, proper stretching for Rt hand, HEP   Plan: Patient agrees to discharge.  Patient goals were partially met. Patient is being discharged due to the patient's request.  ?????         Carey Bullocks, OTR/L 07/12/2017, 2:25 PM  [facility] [address] [place], [place], [address] Phone: [phone]   Fax:  [phone]  Name: Helen Odom MRN: [id number] Date of Birth: December 21, 1930

## 2017-07-12 NOTE — Assessment & Plan Note (Signed)
Had hip fracture.  Will start bisphosphonate

## 2017-07-12 NOTE — Progress Notes (Addendum)
Subjective  Patient is presenting with the following illnesses  R HAND PAIN Not much improvement.  Physical Therapy does not seem to help much.  Has appointment with neuro later this month. Using frozen pills bottles to exercise.  No redness or joint swelling  OSTEOPOROSIS Ok to take fosomax now.  No swallowing problems.  No teeth problems.  Is walking some with assistance  DEPRESSION Intermittently feels very down. IC is helping.  Would like to try to increase dose of zoloft.   Feels is more stressed and is losing it more - crying and upset.  Appetite about the same  Chief Complaint noted Review of Symptoms - see HPI PMH - Smoking status noted.     Objective Vital Signs reviewed Parkinson's features Psych:  Cognition and judgment appear intact. Alert, communicative  and cooperative with normal attention span and concentration. No apparent delusions, illusions, hallucinations R hand - can only open about 30 degrees with pain.  Diffuse swelling No focal joint inflamation    Assessments/Plans  Right hand pain Not improving.  Follow up with neuro Continue home exercises and Physical Therapy   Depression Worsening.  Increase zoloft and continue IC counseling   Osteoporosis Had hip fracture.  Will start bisphosphonate

## 2017-07-19 ENCOUNTER — Encounter: Payer: Self-pay | Admitting: Neurology

## 2017-07-19 ENCOUNTER — Ambulatory Visit
Admission: RE | Admit: 2017-07-19 | Discharge: 2017-07-19 | Disposition: A | Discharge status: Home / Self Care | Payer: Medicare Other | Source: Ambulatory Visit | Attending: Neurology | Admitting: Neurology

## 2017-07-19 ENCOUNTER — Telehealth: Payer: Self-pay | Admitting: Neurology

## 2017-07-19 ENCOUNTER — Ambulatory Visit (INDEPENDENT_AMBULATORY_CARE_PROVIDER_SITE_OTHER): Payer: Medicare Other | Admitting: Neurology

## 2017-07-19 ENCOUNTER — Encounter: Payer: Medicare Other | Admitting: Occupational Therapy

## 2017-07-19 VITALS — BP 145/73 | HR 71

## 2017-07-19 DIAGNOSIS — M25541 Pain in joints of right hand: Secondary | ICD-10-CM

## 2017-07-19 DIAGNOSIS — M7989 Other specified soft tissue disorders: Secondary | ICD-10-CM

## 2017-07-19 DIAGNOSIS — M19041 Primary osteoarthritis, right hand: Secondary | ICD-10-CM | POA: Diagnosis not present

## 2017-07-19 NOTE — Telephone Encounter (Signed)
Spoke to patient.  She has right hand swelling and point tenderness mostly  in the joints of fingers 3-5. An xray today confirms severe arthritis in the fingers where she has most pain, I think her pain is arthritic in etiology. See xray findings below. Asked patient to follow up with pcp.   IMPRESSION: 1. No definite acute fracture or dislocation identified about the right hand. 2. Advanced degenerative osteoarthrosis, greatest within the PIP and DIP joints, and most severe at the third through fifth digits. 3. Osteopenia.

## 2017-07-19 NOTE — Progress Notes (Signed)
[facility]    Provider:  Dr Jaynee Eagles Referring Provider: Lind Covert, * Primary Care Physician:  Lind Covert, MD  CC:  Tenderness, swelling of the joints and hand  Interval history 07/20/2017: Patient is here for a new problem, pain in the right hand which started 3 weeks ago. She has swelling, pain on palpation and pain on movement, mostly affecting digits 3-5 and will point tenderness at the joints.  Her care provider is with her and provides supportive information. Reviewed notes, patient experiencing stress at home caring for her husband with dementia, Dr. Erin Hearing evaluated right hand pain, PT not helping much, patient unclear on trigers, pain is continuous and severe with swelling, unknown inciting events.   Interval History 03/08/2017: Patient returns today for follow-up. She also been diagnosed with parkinsonism past. Did not tolerate Sinemet.She is walking with a walker which is improved. The tremor "comes and goes". Can try low dose neurontin, she was on that in the past without side effects. 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 No injury. Started gradually about 3 weeks ago, definitely new and worsening for 2 weeks. She has been rying to exercise, feels like the hand is swollen and red.   Interval history 11/19/2014:Helen Odom is here with her aunt Helen Odom. Patient has been diagnosed with parkinsonism but has had a tremor for 5 years in arms andboth legs more consistent with essential tremor. She reports she has "foot jumping" of the right foot which is helped by Norco pain medication. She has tremors in the hands which is not at rest but is worse with eating andtrying to get food to her mouth. Walking is difficult, her left leg doesn't seem to "listen to her". She had a fall in December. She says her knee gave way in the restroom and her leg gave out and she fell. The right leg gave way. She has terrible arthritis which  also impacts her walking. She is in a wheelchair and can't walk currently since her fall. Her mother had a tremor as well. The tremor impacts her eating and it gets to the point that it is too much trouble to eat. She is spilling things due to the tremor. No significant resting tremor. More of an action tremor in the arms. She can stop the leg shaking voluntarily. Memory is fine, denies any memory issues. She has difficulty walking, her voice is softer, walking slower and recently she can't walk without significant assistance and has some stooping of her posture. Her left leg is more affected with movement, it does not respond to her brain per patient. Niece provides much information  Reviewed previous records:  HISTORY OF PRESENT ILLNESS, Dr. Felecia Shelling 11/19/2014 :  I had the pleasure seeing you patient,Helen Odom, at [facility] for a neurologic consultation regarding her parkinsonism. She recently moved to the [place] area with her husband. About a year and a half ago, she noted tremor in the right leg and the left greater than right arm. She went to a neurologist who diagnosed her with parkinsonism. He prescribed carbidopa levodopa. However, she took only a couple doses as it made her very nauseous. No other medications were tried. She noted mild weakness in her legs at that time. She feels her arms are not as weak as the legs but they tire out easily. The tremor in her leg is variable, often depending on how she is sitting (worse when ankle is more extended). It  is less likely to occur while standing. It is always worse on her right and sometimes just present on the right. The tremor in the arms (left worse than right) are different than the tremors in the legs. The hand tremors are worse when she tries to hold still (like writing and eating). They are less likely to occur at rest.   The weakness in her legs has increased and she feels weaker over the past few  months. She is having a lot of difficulty getting out of chairs. The tremors have also worsened. Her gait has progressively worsened over the last couple of years. Specifically, her stride has become shorter and her balance is more unsteady. She is very careful so she does not fall over the past couple of months she has had more difficulty getting out of a chair and how her caregivers help her stand. When she stands she is able to walk but with a small stride. For longer distances she uses a wheelchair.  She denies any bladder or bowel changes. She has not had any incontinence.  Her mother had a tremor at times when she was older.  Social History   Social History  . Marital status: Married    Spouse name: N/A  . Number of children: N/A  . Years of education: N/A   Occupational History  . Not on file.   Social History Main Topics  . Smoking status: Never Smoker  . Smokeless tobacco: Never Used  . Alcohol use 0.0 oz/week     Comment: [place] about once per month  . Drug use: No  . Sexual activity: No   Other Topics Concern  . Not on file   Social History Narrative   Lives with her husband who has moderate Alz.   They are in an apartment with 24 hour assistance.   Her niece [name] is her most local relation.  She is in IT at [employer].      Current Social History   04/18/2017   Who lives at home: Husband, [name] with worsening alzheimer's and 24 hour aids 04/18/2017    Transportation: Has own transportation and aids take her to appts 04/18/2017   Important Relationships & Pets: Nieces Rod Holler and [place], [place] Ed and Maudie Mercury (Aid); no pets 04/18/2017    Current Stressors: Husband with worsening Alzheimer's, patient's health and employee problems 04/18/2017   Work / Education:  Never worked outside home/completed HS 04/18/2017   Religious / Personal Beliefs: "I believe in God" 04/18/2017   Interests / Fun: Shopping on-line 04/18/2017   L. Ducatte, RN, BSN                                                                                                            Family History  Problem Relation Age of Onset  . Congestive Heart Failure Mother        Deceased at 70 yo  . CAD Father   . Heart attack Father 42       Deceased  . Atrial fibrillation Brother  Past Medical History:  Diagnosis Date  . Arthritis   . Cancer ([place])   . Colon cancer ([place])   . Depression   . Heart murmur   . Hyperlipidemia   . Movement disorder   . Neuropathy   . Osteoporosis   . Vision abnormalities     Past Surgical History:  Procedure Laterality Date  . CATARACT EXTRACTION, BILATERAL Bilateral 2015  . CHOLECYSTECTOMY    . COLON RESECTION    . HIP PINNING,CANNULATED Right 10/13/2016   Procedure: RIGHT HIP PINNING;  Surgeon: Renette Butters, MD;  Location: [place];  Service: Orthopedics;  Laterality: Right;    Current Outpatient Prescriptions  Medication Sig Dispense Refill  . acetaminophen (TYLENOL) 325 MG tablet Take 2 tablets (650 mg total) by mouth every 6 (six) hours as needed for mild pain (or Fever >/= 101). 30 tablet 0  . alendronate (FOSAMAX) 70 MG tablet Take 1 tablet (70 mg total) by mouth every 7 (seven) days. Take with a full glass of water on an empty stomach. 4 tablet 11  . aspirin EC 81 MG tablet Take 81 mg by mouth daily.    . cholecalciferol (VITAMIN D) 1000 UNITS tablet Take 1,000 Units by mouth daily.    . diclofenac sodium (VOLTAREN) 1 % GEL Apply 2 g topically 4 (four) times daily. 1 Tube 11  . esomeprazole (NEXIUM) 20 MG capsule Take 20 mg by mouth daily at 12 noon.    . gabapentin (NEURONTIN) 100 MG capsule Take 1 capsule (100 mg total) by mouth 3 (three) times daily. 90 capsule 6  . HYDROcodone-acetaminophen (NORCO/VICODIN) 5-325 MG tablet Take 0.5-1 tablets by mouth every 8 (eight) hours as needed for moderate pain. 60 tablet 0  . sertraline (ZOLOFT) 50 MG tablet Take 1 tablet (50 mg total) by mouth daily. 90 tablet 1  . vitamin B-12 (CYANOCOBALAMIN) 1000 MCG  tablet Take 1,000 mcg by mouth daily.     No current facility-administered medications for this visit.     Allergies as of 07/19/2017 - Review Complete 07/19/2017  Allergen Reaction Noted  . Flu virus vaccine Swelling 10/14/2014  . Penicillins Rash 10/14/2014    Vitals: BP (!) 145/73   Pulse 71   LMP 10/14/1988  Last Weight:  Wt Readings from Last 1 Encounters:  05/24/17 166 lb (75.3 kg)   Last Height:   Ht Readings from Last 1 Encounters:  05/24/17 5' (1.524 m)    Right hand swelling, tenderness on palpation of the joints, pain on finger 3-5 movement.     Assessment/Plan:  Patient with parkinsonism here for a new problem, hand pain. The joint swelling, point tenderness of the joints is not consistant with parksinsons dystonia. The joints are swollen, there is significant pain on palpation of the PIP/DIP more than MCPs and more in digits 3-5. Botox will not improve this condition. Xray ordered. Consulted with Dr. Krista Blue.   Addendum: pain. An xray today confirms severe arthritis in the fingers where she has most pain, I think it is arthritic.   XR Hand today, personally reviewed imaging: : 1. No definite acute fracture or dislocation identified about the right hand. 2. Advanced degenerative osteoarthrosis, greatest within the PIP and DIP joints, and most severe at the third through fifth digits. 3. Osteopenia.   Orders Placed This Encounter  Procedures  . DG Wrist Complete Right     Sarina Ill, MD  [facility] [address] [place], [place] [address]  Phone [phone]  Fax [phone]  A total of 25 minutes was spent face-to-face with this patient. Over half this time was spent on counseling patient on the arthritis diagnosis and different diagnostic and therapeutic options available.

## 2017-07-22 ENCOUNTER — Other Ambulatory Visit: Payer: Self-pay | Admitting: Family Medicine

## 2017-07-22 DIAGNOSIS — F3289 Other specified depressive episodes: Secondary | ICD-10-CM

## 2017-07-26 ENCOUNTER — Encounter: Payer: Medicare Other | Admitting: Occupational Therapy

## 2017-08-02 ENCOUNTER — Ambulatory Visit: Payer: Medicare Other | Admitting: Neurology

## 2017-08-09 ENCOUNTER — Ambulatory Visit: Payer: Medicare Other

## 2017-08-10 ENCOUNTER — Ambulatory Visit: Payer: Medicare Other

## 2017-08-16 ENCOUNTER — Ambulatory Visit: Payer: Medicare Other

## 2017-08-16 ENCOUNTER — Ambulatory Visit: Payer: Medicare Other | Admitting: Family Medicine

## 2017-08-23 ENCOUNTER — Ambulatory Visit: Payer: Medicare Other | Admitting: Family Medicine

## 2017-09-06 ENCOUNTER — Encounter: Payer: Self-pay | Admitting: Family Medicine

## 2017-09-06 ENCOUNTER — Ambulatory Visit (INDEPENDENT_AMBULATORY_CARE_PROVIDER_SITE_OTHER): Payer: Medicare Other | Admitting: Licensed Clinical Social Worker

## 2017-09-06 ENCOUNTER — Ambulatory Visit (INDEPENDENT_AMBULATORY_CARE_PROVIDER_SITE_OTHER): Payer: Medicare Other | Admitting: Family Medicine

## 2017-09-06 VITALS — BP 144/72 | HR 75 | Temp 98.0°F | Ht 60.0 in

## 2017-09-06 DIAGNOSIS — Z23 Encounter for immunization: Secondary | ICD-10-CM | POA: Diagnosis present

## 2017-09-06 DIAGNOSIS — M79641 Pain in right hand: Secondary | ICD-10-CM | POA: Diagnosis not present

## 2017-09-06 DIAGNOSIS — F3289 Other specified depressive episodes: Secondary | ICD-10-CM | POA: Diagnosis not present

## 2017-09-06 DIAGNOSIS — R600 Localized edema: Secondary | ICD-10-CM | POA: Diagnosis not present

## 2017-09-06 DIAGNOSIS — F439 Reaction to severe stress, unspecified: Secondary | ICD-10-CM

## 2017-09-06 MED ORDER — HYDROCODONE-ACETAMINOPHEN 5-325 MG PO TABS: 0.5000 | ORAL_TABLET | Freq: Three times a day (TID) | ORAL | 0 refills | Status: DC | PRN

## 2017-09-06 MED ORDER — SERTRALINE HCL 100 MG PO TABS: 100.0000 mg | ORAL_TABLET | Freq: Every day | ORAL | 6 refills | Status: DC

## 2017-09-06 NOTE — Assessment & Plan Note (Signed)
Stable See after visit summary

## 2017-09-06 NOTE — Assessment & Plan Note (Signed)
Stable.  See after visit summary continue range of motion exercises and use aspercreme at least four times a day.

## 2017-09-06 NOTE — Progress Notes (Signed)
  Total time:30 Minutes Type of Service: Worth office visit Interpretor:No.    SUBJECTIVE: Helen Odom is a 83 y.o. female. Patient was referred by Dr. Erin Hearing for: Stress at home related to her husband's dementia.  She was accompanied by her caregiver Suanne Marker.  Patient is very pleasant and engaged in conversation.  Reports she has noticed a she is less tearful with the increased her zoloft.   LIFE CONTEXT:  Family & Social: lives with her husband (no children), niece Rod Holler is HPOA.  Patient and husband have 24 hour caregivers.  Life changes: Patient is getting out of the house more GOALS: Patient will reduce symptoms of: stress, and depression, increase knowledge and ability TA:VWPVXY skills, self-management skills and stress reduction,  Intervention: Reflective listening, motivational Interviewing, and supportive counseling.    Issues discussed: self care action plan, barriers to getting out more,  things patient enjoys doing, monthly alzheimer's support group, managing changes with husband, meeting husband where he and patient's new automobile with a lift.  Patient also discussed needing a new transport wheelchair. LCSW will provide PCP an update of this request.    ASSESSMENT:Patient continues to experience stress and symptoms of depression related to the progression of husband dementia. She is making progress with implementing behavioral activation, self-care and managing stress associated with her husband and her limited mobility.  Patient could benefit from continued interventions to assist with managing her symptoms.     PLAN: 1. Patient will meet with LCSW during next office visit with PCP 2. Patient will continue with her self-care action plan (plan weekly outing for self) 3.  Aide will continue to go to support groups and obtain information for patient.  4. LCSW will assist with getting patient connected to source for DME   Casimer Lanius, LCSW Licensed  Clinical Social Worker [place]   [phone] 12:25 PM

## 2017-09-06 NOTE — Patient Instructions (Signed)
Good to see you today!  Thanks for coming in.  For the hand - use the hydrocodone as needed but usually once a day Aspercreme - 4 times a day every day  For the legs - elevate and wear compression support hose whenever not elevating.  Watch for ulcers  Continue the zoloft 100 mg a day  Come back as you need refill or if any problems

## 2017-09-06 NOTE — Assessment & Plan Note (Signed)
Somewhat improved.  Continue counseling and SSRI

## 2017-09-06 NOTE — Progress Notes (Signed)
Subjective  Patient is presenting with the following illnesses  Hand Pain Thinks is a little better with range of motion.  Using heat and stretching at home.  Taking hydrocodone as needed 1-2 per day.  Using aspercreme 1-2 x a day.  No redness or fever  Leg Edema Goes down some when elevates.  Not wearing stockings.  No skin breakdown or sores or pain. Does not ambulate very far at all.  Depression Feels this is some better but not "cured" with increased zoloft (about 6 weeks ago) fatigued and not hopefull about the future.  No suicidal ideation appetite is ok. Seeing IC which helps    Chief Complaint noted Review of Symptoms - see HPI PMH - Smoking status noted.     Objective Vital Signs reviewed Psych:  Cognition and judgment appear intact. Alert, communicative  and cooperative with normal attention span and concentration. No apparent delusions, illusions, hallucinations R hand - good range of motion of thumb and index with increasing limited range of motion (active or passive) to pinky due to pain.  No redness and minimal edema.   Feet - 2+ edema without skin breakdown    Assessments/Plans  Depression Somewhat improved.  Continue counseling and SSRI  Pedal edema Stable See after visit summary   Right hand pain Stable.  See after visit summary continue range of motion exercises and use aspercreme at least four times a day.    See after visit summary for details of patient instuctions

## 2017-09-08 ENCOUNTER — Telehealth: Payer: Self-pay | Admitting: Licensed Clinical Social Worker

## 2017-09-08 NOTE — Progress Notes (Signed)
F/U call to patient to provide phone numbers to [employer] and [employer] as she is interested in renting an Clinical research associate or scooter to try at home.   Patient appreciative of information.  Plan: Patient will f/u with neurology for a new wheelchair of her own.     Casimer Lanius, LCSW Licensed Clinical Social Worker [place]   [phone] 2:16 PM

## 2017-09-24 ENCOUNTER — Other Ambulatory Visit: Payer: Self-pay | Admitting: Family Medicine

## 2017-09-24 DIAGNOSIS — F3289 Other specified depressive episodes: Secondary | ICD-10-CM

## 2017-09-25 MED ORDER — SERTRALINE HCL 100 MG PO TABS: 100.0000 mg | ORAL_TABLET | Freq: Every day | ORAL | 6 refills | Status: DC

## 2017-10-24 ENCOUNTER — Telehealth: Payer: Self-pay | Admitting: Neurology

## 2017-10-24 NOTE — Telephone Encounter (Signed)
Pt called she is having problems with rt hand again. She has already followed up PCP and was advised there was nothing else he could do. In talking with the pt she decided she would call PCP back and ask for referral to rhematologist

## 2017-10-25 ENCOUNTER — Encounter: Payer: Self-pay | Admitting: Licensed Clinical Social Worker

## 2017-10-25 ENCOUNTER — Encounter: Payer: Self-pay | Admitting: Family Medicine

## 2017-10-25 ENCOUNTER — Ambulatory Visit (INDEPENDENT_AMBULATORY_CARE_PROVIDER_SITE_OTHER): Payer: Medicare Other | Admitting: Family Medicine

## 2017-10-25 ENCOUNTER — Other Ambulatory Visit: Payer: Self-pay

## 2017-10-25 DIAGNOSIS — F3289 Other specified depressive episodes: Secondary | ICD-10-CM | POA: Diagnosis not present

## 2017-10-25 DIAGNOSIS — M79641 Pain in right hand: Secondary | ICD-10-CM | POA: Diagnosis present

## 2017-10-25 MED ORDER — DICLOFENAC POTASSIUM 50 MG PO TABS: 50.0000 mg | ORAL_TABLET | Freq: Two times a day (BID) | ORAL | 3 refills | Status: DC | PRN

## 2017-10-25 MED ORDER — GUAIFENESIN-CODEINE 100-10 MG/5ML PO SYRP: 5.0000 mL | ORAL_SOLUTION | Freq: Three times a day (TID) | ORAL | 1 refills | Status: DC | PRN

## 2017-10-25 MED ORDER — HYDROCODONE-ACETAMINOPHEN 5-325 MG PO TABS: 0.5000 | ORAL_TABLET | Freq: Three times a day (TID) | ORAL | 0 refills | Status: DC | PRN

## 2017-10-25 NOTE — Progress Notes (Signed)
Type of Service: Integrated Behavioral Health 5th F/U Visit Total time:20 minutes :  Interpretor:No.    Reason for follow-up: Continue brief intervention to assist patient with managing symptoms of depression, as well as family stressors . Reports  Somewhat difficult with impairment in her daily functions. Patient is pleasant and engaged in conversation.   Appearance:Well Groomed ; Thought process: Coherent; Affect: Appropriate  PHQ 9=7, indication of: moderate depression.    Patient appears to be doing well with managing stressors related to her husbands dementia.  Most recent stressors are related to caregiver changes and adjustments.  States overall husband is happy which makes her happy. GOALS: Patient will reduce symptoms of: depression and stress , and increase  ability UY:[id number] skills and self-management skills, . Intervention:, Reflective listening, Problem-solving teaching/coping strategies and Supportive Counseling   Issues discussed: training new staff on husbands needs  ; self-care  ;  Educational material ; behavioral changes with husband ; family coming to visit  ASSESSMENT:Patient continues to experience symptoms of moderate depression related to caring for husband.  Patient will utilize information provided to assist with educating new caretakers. Patient will set up a note book with information for each caretaker to read related to managing behavioral changes  with husband.   PLAN: 1. Patient will F/U with LCSW during next office visit with PCP 2. Behavioral recommendations: self-care and education  3. Referral: none at this time  Casimer Lanius, LCSW Licensed Clinical Social Worker [place]   [phone] 3:25 PM

## 2017-10-25 NOTE — Assessment & Plan Note (Signed)
Worsened.   Will add Diclofenac and continue other medications.  She should call for refills on hydrocodone

## 2017-10-25 NOTE — Assessment & Plan Note (Signed)
Stable - continue current medications and counseling.  See how does after the holidays .

## 2017-10-25 NOTE — Progress Notes (Signed)
Hand Pain  About the same perhaps a little worse.  Has pain a lot of the day  Using heat and stretching at home.  Taking hydrocodone as needed 1-2 per day.  Using aspercreme 1-2 x a day.  No redness or fever, sometimes feel warm   Leg Edema Goes down some when elevates.  Not wearing stockings.  No skin breakdown or sores or pain. Does not ambulate very far at all.  Depression About the same.  Worsened some with care staff changes but has since improved.   No suicidal ideation appetite is ok.  Seeing IC which helps    Chief Complaint noted Review of Symptoms - see HPI PMH - Smoking status noted.     Objective Vital Signs reviewed Psych:  Cognition and judgment appear intact. Alert, communicative  and cooperative with normal attention span and concentration. No apparent delusions, illusions, hallucinations Mask Facies due to Parkinsons R Hand - significant deformity, no redness or warmth.  Can not extend medial fingers past 90 degrees with passive extension which causes pain     Assessments/Plans  Depression Stable - continue current medications and counseling.  See how does after the holidays .    Right hand pain Worsened.   Will add Diclofenac and continue other medications.  She should call for refills on hydrocodone    See after visit summary for details of patient instuctions

## 2017-10-25 NOTE — Patient Instructions (Signed)
Good to see you today!  Thanks for coming in.  Call me if the pain is not tolerable.  Continue the exercise and heat  Call if your mood is worsening  Come back in 2-3 months

## 2017-12-18 ENCOUNTER — Other Ambulatory Visit: Payer: Self-pay | Admitting: Family Medicine

## 2017-12-18 ENCOUNTER — Other Ambulatory Visit: Payer: Self-pay

## 2017-12-18 DIAGNOSIS — M47816 Spondylosis without myelopathy or radiculopathy, lumbar region: Secondary | ICD-10-CM

## 2017-12-18 DIAGNOSIS — M159 Polyosteoarthritis, unspecified: Secondary | ICD-10-CM

## 2017-12-18 DIAGNOSIS — M79641 Pain in right hand: Secondary | ICD-10-CM

## 2017-12-18 MED ORDER — HYDROCODONE-ACETAMINOPHEN 5-325 MG PO TABS: 0.5000 | ORAL_TABLET | Freq: Three times a day (TID) | ORAL | 0 refills | Status: DC | PRN

## 2017-12-18 NOTE — Telephone Encounter (Signed)
Patient left message on nurse line requesting refill of Norco. Danley Danker, RN [facility] RN)

## 2017-12-18 NOTE — Progress Notes (Signed)
Norco 5/325 #60 sent online to pharmacy, [place].

## 2018-01-30 ENCOUNTER — Other Ambulatory Visit: Payer: Self-pay

## 2018-01-30 DIAGNOSIS — M159 Polyosteoarthritis, unspecified: Secondary | ICD-10-CM

## 2018-01-30 DIAGNOSIS — M79641 Pain in right hand: Secondary | ICD-10-CM

## 2018-01-30 DIAGNOSIS — M47816 Spondylosis without myelopathy or radiculopathy, lumbar region: Secondary | ICD-10-CM

## 2018-01-31 MED ORDER — HYDROCODONE-ACETAMINOPHEN 5-325 MG PO TABS: 0.5000 | ORAL_TABLET | Freq: Three times a day (TID) | ORAL | 0 refills | Status: DC | PRN

## 2018-02-21 ENCOUNTER — Telehealth: Payer: Self-pay

## 2018-02-21 NOTE — Telephone Encounter (Signed)
Pt called nurse line, requesting a Rx for her to receive a power wheelchair. She states she has not walked in over a year and feels like it is time she use power wheelchair. Her call back [phone] Wallace Cullens, RN

## 2018-02-22 ENCOUNTER — Encounter: Payer: Self-pay | Admitting: Family Medicine

## 2018-03-01 NOTE — Telephone Encounter (Signed)
Put copy of Rx in the to be faxed box

## 2018-03-01 NOTE — Telephone Encounter (Signed)
Spoke with her.  Need to fax Rx to [place] at [place] [phone]

## 2018-03-06 ENCOUNTER — Encounter: Payer: Self-pay | Admitting: Family Medicine

## 2018-03-06 ENCOUNTER — Ambulatory Visit (INDEPENDENT_AMBULATORY_CARE_PROVIDER_SITE_OTHER): Payer: Medicare Other | Admitting: Family Medicine

## 2018-03-06 ENCOUNTER — Other Ambulatory Visit: Payer: Self-pay

## 2018-03-06 DIAGNOSIS — R269 Unspecified abnormalities of gait and mobility: Secondary | ICD-10-CM | POA: Diagnosis not present

## 2018-03-06 DIAGNOSIS — M810 Age-related osteoporosis without current pathological fracture: Secondary | ICD-10-CM

## 2018-03-06 DIAGNOSIS — M159 Polyosteoarthritis, unspecified: Secondary | ICD-10-CM | POA: Diagnosis not present

## 2018-03-06 DIAGNOSIS — M79641 Pain in right hand: Secondary | ICD-10-CM

## 2018-03-06 DIAGNOSIS — G20 Parkinson's disease: Secondary | ICD-10-CM

## 2018-03-06 DIAGNOSIS — M47816 Spondylosis without myelopathy or radiculopathy, lumbar region: Secondary | ICD-10-CM

## 2018-03-06 DIAGNOSIS — G20A1 Parkinson's disease without dyskinesia, without mention of fluctuations: Secondary | ICD-10-CM | POA: Insufficient documentation

## 2018-03-06 DIAGNOSIS — F3289 Other specified depressive episodes: Secondary | ICD-10-CM | POA: Diagnosis not present

## 2018-03-06 MED ORDER — HYDROCODONE-ACETAMINOPHEN 5-325 MG PO TABS: 0.5000 | ORAL_TABLET | Freq: Three times a day (TID) | ORAL | 0 refills | Status: DC | PRN

## 2018-03-06 NOTE — Progress Notes (Signed)
Subjective  Helen Odom is a 84 y.o. female is presenting with the following  GAIT ABNORMALITY Worsening.  Unable to stand without assistance.  Not able to walk with walker.  Feels stiff (Parkinsonisms) and pain (DJD) and weak (deconditioning)  No focal weakness or incontinence.  No falls  ARTHRITIS Disease monitoring Most involved joints: hands, hips.   Joint swelling: no    Joint Redness: no  Limitations: can't open her R hand. Pain in arms and legs Medication Management Compliance:  Using diclofenace and hydrocodone which help some but is usually in pain   Abdominal pain: no   GI Bleeding:  no  DEPRESSION Disease Monitoring Current symptoms include depressed mood, fatigue and hopelessness            Symptoms have been gradually worsening     Is Exercising not able to due to above  Evidence of suicidal ideation: no Medication Monitoring Compliance: taking as prescribed Decreased Libido NA      Lightheadedness NA     Insomnia yes very fragmented sleep due to worrying about care for her demented husband  GI symptoms no.  Plans to follow up with Integrated [facility]   Chief Complaint noted Review of Symptoms - see HPI PMH - Smoking status noted.    Objective Vital Signs reviewed BP (!) 150/78   Pulse 75   Temp 98.5 F (36.9 C) (Oral)   LMP 10/14/1988   SpO2 97%  In wheelchair.  Tearful but alert oriented Unable to rise  without major assistance   Assessments/Plans  See after visit summary for details of patient instuctions  Abnormality of gait Worsening.  Will retry Physical Therapy.  Will have evaluated for power wheel chair.  To follow up with neurology  DJD (degenerative joint disease), multiple sites Worsening due to her other mobility problems.  Continue analgesics.  [facility]   Osteoporosis Patient does not want to take any medications for OP  Depression Very difficult social situation.  Hopefully Physical Therapy will give some more outlets  as perhaps will the power wheelchair.  Continue counseling with IC.  May consider increasing or changing medication if not stabilizing

## 2018-03-06 NOTE — Patient Instructions (Addendum)
Good to see you today!  Thanks for coming in.  Take 2 gabapentin when you want to sleep  Call me if Physical Therapy does not contact you  Use Tylenol and diclofenac together and hydrocodone for pain  Call Neoma Laming to talk  Come back in one month

## 2018-03-07 ENCOUNTER — Encounter: Payer: Self-pay | Admitting: Family Medicine

## 2018-03-07 NOTE — Assessment & Plan Note (Signed)
Patient does not want to take any medications for OP

## 2018-03-07 NOTE — Assessment & Plan Note (Signed)
Worsening.  Will retry Physical Therapy.  Will have evaluated for power wheel chair.  To follow up with neurology

## 2018-03-07 NOTE — Assessment & Plan Note (Signed)
Worsening due to her other mobility problems.  Continue analgesics.  [facility]

## 2018-03-07 NOTE — Assessment & Plan Note (Signed)
Very difficult social situation.  Hopefully Physical Therapy will give some more outlets as perhaps will the power wheelchair.  Continue counseling with IC.  May consider increasing or changing medication if not stabilizing

## 2018-03-09 ENCOUNTER — Ambulatory Visit: Payer: Medicare Other | Admitting: Licensed Clinical Social Worker

## 2018-03-09 DIAGNOSIS — Z636 Dependent relative needing care at home: Secondary | ICD-10-CM

## 2018-03-09 NOTE — Progress Notes (Signed)
Type of Service: Integrated Behavioral Health   Helen Odom is a 85 y.o. female referred by Dr. Brooke Dare for assistance with managing caregiver stress.  Patient was accompanied by her and husband caretaker Suanne Marker. Patient reports :concerns with  Continued decline in husbands health, concerned with finding 24hr "good" caretakers for her and husband.  Life & Social patient lives with husband ,who has Dementia; they have 24 hour care takers  Recent llfe changes: turnover with care takers; and continued decline in husbands health Issues discussed:  support system, coping skills, community resources ; check list and Hospice support groups and facility options.   Intervention: Reflective listening, supportive counseling, solutions focus strategies, community resources, Problem-solving teaching/coping strategies and emotional support. Assessment/Plan:.Patient has symptoms of stress managing 24 hour caretakers. It is also stressful for her to watch continued decline of husband. She  may benefit from, and is in agreement to continue brief therapeutic interventions to assist with managing her stress. Patient will F/U on interventions discussed today. (putting check list in place for caretakers and reach out to Hospice for information).  Casimer Lanius, LCSW Licensed Clinical Social Worker [facility]   [phone] 3:57 PM

## 2018-03-15 ENCOUNTER — Ambulatory Visit: Payer: Medicare Other | Admitting: Licensed Clinical Social Worker

## 2018-03-15 DIAGNOSIS — Z636 Dependent relative needing care at home: Secondary | ICD-10-CM

## 2018-03-15 NOTE — Progress Notes (Signed)
Type of Service: [place] F/U Visit Total time:35 minutes :  Interpreter:No.    Reason for follow-up: Continue brief intervention to assist patient with managing caregiver stressors . Patient accompanied by her personal aid Suanne Marker. Who transports her to all appointments and is her support system.  Patient is making progress towards goal.  States she will take one day at a time.   Goals: Patient will continue to reduce: stress , and increase  ability AL:PFXTKW skills, self-management skills and stress reduction. Intervention: Solution-Focused Strategies and Supportive Counseling, Reflective listening,, Producer, television/film/video teaching/coping strategies   Assessment:Patient continues to experience stress related to caring for her husband and making sure they have 24 hour personal aid .  Patient does well with being able to process her thoughts and utilizing solution focused interventions to come up with answers to situations she encounters with husband and 24 hour caretakers. Plan: Patient may benefit from, and is in agreement to receive further brief therapeutic interventions to assist with managing stress. Patient will return in 3 weeks.   Casimer Lanius, LCSW Licensed Clinical Social Worker [facility]   [phone] 3:34 PM

## 2018-03-31 ENCOUNTER — Encounter: Payer: Self-pay | Admitting: Family Medicine

## 2018-04-04 ENCOUNTER — Encounter: Payer: Self-pay | Admitting: Family Medicine

## 2018-04-04 ENCOUNTER — Ambulatory Visit: Payer: Medicare Other | Admitting: Family Medicine

## 2018-04-04 ENCOUNTER — Ambulatory Visit: Payer: Medicare Other

## 2018-04-04 DIAGNOSIS — R269 Unspecified abnormalities of gait and mobility: Secondary | ICD-10-CM

## 2018-04-11 ENCOUNTER — Encounter

## 2018-04-11 ENCOUNTER — Other Ambulatory Visit: Payer: Self-pay

## 2018-04-11 ENCOUNTER — Encounter: Payer: Self-pay | Admitting: Family Medicine

## 2018-04-11 ENCOUNTER — Ambulatory Visit (INDEPENDENT_AMBULATORY_CARE_PROVIDER_SITE_OTHER): Payer: Medicare Other | Admitting: Family Medicine

## 2018-04-11 ENCOUNTER — Ambulatory Visit: Payer: Medicare Other | Admitting: Neurology

## 2018-04-11 ENCOUNTER — Ambulatory Visit: Payer: Medicare Other | Admitting: Licensed Clinical Social Worker

## 2018-04-11 DIAGNOSIS — M19041 Primary osteoarthritis, right hand: Secondary | ICD-10-CM | POA: Diagnosis not present

## 2018-04-11 DIAGNOSIS — R471 Dysarthria and anarthria: Secondary | ICD-10-CM | POA: Diagnosis not present

## 2018-04-11 DIAGNOSIS — Z7982 Long term (current) use of aspirin: Secondary | ICD-10-CM | POA: Diagnosis not present

## 2018-04-11 DIAGNOSIS — F3289 Other specified depressive episodes: Secondary | ICD-10-CM | POA: Diagnosis not present

## 2018-04-11 DIAGNOSIS — M79641 Pain in right hand: Secondary | ICD-10-CM

## 2018-04-11 DIAGNOSIS — M810 Age-related osteoporosis without current pathological fracture: Secondary | ICD-10-CM | POA: Diagnosis not present

## 2018-04-11 DIAGNOSIS — M47816 Spondylosis without myelopathy or radiculopathy, lumbar region: Secondary | ICD-10-CM | POA: Diagnosis not present

## 2018-04-11 DIAGNOSIS — Z636 Dependent relative needing care at home: Secondary | ICD-10-CM

## 2018-04-11 DIAGNOSIS — Z9181 History of falling: Secondary | ICD-10-CM | POA: Diagnosis not present

## 2018-04-11 DIAGNOSIS — M159 Polyosteoarthritis, unspecified: Secondary | ICD-10-CM | POA: Diagnosis not present

## 2018-04-11 DIAGNOSIS — R499 Unspecified voice and resonance disorder: Secondary | ICD-10-CM | POA: Diagnosis not present

## 2018-04-11 DIAGNOSIS — M1611 Unilateral primary osteoarthritis, right hip: Secondary | ICD-10-CM | POA: Diagnosis not present

## 2018-04-11 DIAGNOSIS — Z79891 Long term (current) use of opiate analgesic: Secondary | ICD-10-CM | POA: Diagnosis not present

## 2018-04-11 DIAGNOSIS — M24541 Contracture, right hand: Secondary | ICD-10-CM | POA: Diagnosis not present

## 2018-04-11 DIAGNOSIS — M47817 Spondylosis without myelopathy or radiculopathy, lumbosacral region: Secondary | ICD-10-CM | POA: Diagnosis not present

## 2018-04-11 MED ORDER — HYDROCODONE-ACETAMINOPHEN 5-325 MG PO TABS: 0.5000 | ORAL_TABLET | Freq: Three times a day (TID) | ORAL | 0 refills | Status: DC | PRN

## 2018-04-11 NOTE — Assessment & Plan Note (Signed)
Worsened.  Will increase her hydrocodone as the pain is greatly affecting her QOL and she understands the risks of sedation and habituation.

## 2018-04-11 NOTE — Progress Notes (Signed)
Subjective  Helen Odom is a 84 y.o. female is presenting with the following  Chief Complaint noted Review of Symptoms - see HPI PMH - Smoking status noted.    GAIT ABNORMALITY No change.  Unable to stand without assistance.  Not able to walk with walker.  Feels stiff (Parkinsonisms) and pain (DJD) and weak (deconditioning)  No focal weakness or incontinence.  No falls  ARTHRITIS Disease monitoring Most involved joints: hands, hips.   Joint swelling: no    Joint Redness: no  Limitations: can't open her R hand. Pain in arms and legs Medication Management Compliance:  Using diclofenace and hydrocodone which help some but is usually in pain  Taking the hydrocodone three times a day but feels it wears off too soon.  No constipation  Abdominal pain: no   GI Bleeding:  no  DEPRESSION Disease Monitoring Current symptoms include depressed mood, fatigue and hopelessness            Symptoms have been gradually worsening     Is Exercising not able to due to above Will start Physical Therapy soon  Evidence of suicidal ideation: Yes just feels like her current life is not worth living but no plans for action Medication Monitoring Compliance: she did not bring her medications but thinks she is taking 50 mg of zoloft not 100 as per her records Decreased Libido NA      Lightheadedness NA     Insomnia yes very fragmented sleep due to worrying about care for her demented husband  GI symptoms no.  Continues to follow up with [facility]    Objective Vital Signs reviewed BP 128/84   Pulse 81   Temp 97.8 F (36.6 C) (Oral)   LMP 10/14/1988   SpO2 97%  Masked Facies Unable to rise frow WC alone PHQ9-30    Assessments/Plans  See after visit summary for details of patient instuctions  Depression Not controlled.  Hopefully will start Physical Therapy soon.  To check to make sure she is taking 100 mg zoloft daily.  Continue counseling   DJD (degenerative joint disease), multiple  sites Worsened.  Will increase her hydrocodone as the pain is greatly affecting her QOL and she understands the risks of sedation and habituation.

## 2018-04-11 NOTE — Progress Notes (Signed)
Type of Service: [place] F/U Visit Total time:30 minutes :  Interpreter:No.    Reason for follow-up: Continue brief intervention to assist patient with managing symptoms of anxiety and depression, related to caregiver stressors . Patient accompanied by her care taker aide Caren Griffins, patient is pleasant, soft spoken and engaged in conversation .   Appearance:Neat ; Thought process: Coherent; Affect: Depressed and Tearful at times. Thought that she would be better off "not here" due to not being able to walk and other limitations, : No plan to harm self or others wanting to make sure husband is cared for keeps her going.  Per patient she would never hurt herself.  Recent stress in the past 2 weeks is why she selected 3's below.  This is mostly related to turnover of c aides in the home and current aides been sick. GAD 7 : Generalized Anxiety Score 04/11/2018  Nervous, Anxious, on Edge 3  Control/stop worrying 3  Worry too much - different things 3  Trouble relaxing 3  Restless 3  Easily annoyed or irritable 3  Afraid - awful might happen 3  Total GAD 7 Score 21   Depression screen [facility] 2/9 04/11/2018 04/11/2018 03/06/2018  Decreased Interest 3 1 1   Down, Depressed, Hopeless 3 1 1   PHQ - 2 Score 6 2 2   Altered sleeping 3 - -  Tired, decreased energy 3 - -  Change in appetite 3 - -  Feeling bad or failure about yourself  3 - -  Trouble concentrating 3 - -  Moving slowly or fidgety/restless 3 - -  Suicidal thoughts 3 - -  PHQ-9 Score 27 - -  Difficult doing work/chores - - -   Goals: Patient will reduce symptoms of: anxiety, depression and stress , and increase  ability HD:QQIWLN skills, self-management skills and stress reduction, Increase healthy adjustment to current life circumstances. Intervention: Solution-Focused Strategies, Mindfulness or Psychologist, educational, Behavioral Activation and Supportive Counseling, Reflective listening,   Issues discussed: stressors and how  managing them ;  Things patient enjoys doing ;  Review of relaxed breathing ; support system ; thoughts behind positive phq-9;  Assessment:Patient continues to experience symptoms of depression related to caregiver stress and her own limitations.  Patient may benefit from, and is in agreement to continue brief therapeutic interventions to assist with managing her symptoms.  Plan:Patient will work on the plan below prior to next office visit.  1. Patient will F/U with LCSW during next office visit with PCP 2. Behavioral recommendations: relaxed breathing, getting out once a week, and reading book 3. Referral: Alzheimer's community and online support  Casimer Lanius, LCSW Licensed Clinical Social Worker [place]   [phone] 11:49 AM

## 2018-04-11 NOTE — Patient Instructions (Addendum)
Good to see you today!  Thanks for coming in.  Check to see if Zoloft Sertraline is 100 mg tabs - one a day  Take the hydrocodone as needed   Let me know if the Physical Therapy isn't working  Come back in 1 month

## 2018-04-11 NOTE — Assessment & Plan Note (Signed)
Not controlled.  Hopefully will start Physical Therapy soon.  To check to make sure she is taking 100 mg zoloft daily.  Continue counseling

## 2018-04-12 ENCOUNTER — Telehealth: Payer: Self-pay

## 2018-04-12 NOTE — Telephone Encounter (Signed)
Called and left vm with above Physical Therapy orders .

## 2018-04-12 NOTE — Telephone Encounter (Signed)
Raquel Sarna or Jenny Reichmann, PT with [facility] called for following verbal orders:  PT 1x/week x 1 week, then 2x/week x 6 weeks  Also would like to have OT and ST (speech therapy) eval.  Call back is [phone]. It is ok to leave message.  Danley Danker, RN [facility] RN)

## 2018-04-16 ENCOUNTER — Telehealth: Payer: Self-pay | Admitting: *Deleted

## 2018-04-16 DIAGNOSIS — M19041 Primary osteoarthritis, right hand: Secondary | ICD-10-CM | POA: Diagnosis not present

## 2018-04-16 DIAGNOSIS — M1611 Unilateral primary osteoarthritis, right hip: Secondary | ICD-10-CM | POA: Diagnosis not present

## 2018-04-16 DIAGNOSIS — R471 Dysarthria and anarthria: Secondary | ICD-10-CM | POA: Diagnosis not present

## 2018-04-16 DIAGNOSIS — M24541 Contracture, right hand: Secondary | ICD-10-CM | POA: Diagnosis not present

## 2018-04-16 DIAGNOSIS — M810 Age-related osteoporosis without current pathological fracture: Secondary | ICD-10-CM | POA: Diagnosis not present

## 2018-04-16 DIAGNOSIS — M47817 Spondylosis without myelopathy or radiculopathy, lumbosacral region: Secondary | ICD-10-CM | POA: Diagnosis not present

## 2018-04-16 NOTE — Telephone Encounter (Signed)
Helen Odom needs verbal orders for Speech therapy for the following:  2 x a week for 3 weeks for reduced speech and voice function.   You can leave a detailed message on [name] secure VM. Fleeger, [name], CMA

## 2018-04-17 ENCOUNTER — Ambulatory Visit (INDEPENDENT_AMBULATORY_CARE_PROVIDER_SITE_OTHER): Payer: Medicare Other | Admitting: Family Medicine

## 2018-04-17 ENCOUNTER — Other Ambulatory Visit: Payer: Self-pay

## 2018-04-17 ENCOUNTER — Encounter: Payer: Self-pay | Admitting: Family Medicine

## 2018-04-17 DIAGNOSIS — R269 Unspecified abnormalities of gait and mobility: Secondary | ICD-10-CM | POA: Diagnosis present

## 2018-04-17 NOTE — Progress Notes (Signed)
Subjective  Helen Odom is a 84 y.o. female is presenting with the following  GAIT ABNORMALITY Follow up for Face to Face visit for power wheel chair Currently patient is unable to rise or ambulate without outside assistance.   A power wheelchair would allow her to perform ADLs of getting to the bath and retrieving food and materials from the kitchen Patient is unable to use a cane or walker or manual wheel chair or scooter due to her lower extremity weakness and upper extrem Parkinson's and contractures    Chief Complaint noted Review of Symptoms - see HPI PMH - Smoking status noted.    Objective Vital Signs reviewed BP 130/72   Pulse 84   Temp 98.4 F (36.9 C) (Oral)   Ht 5' (1.524 m)   LMP 10/14/1988   SpO2 99%   BMI 32.42 kg/m  Sitting in wheelchair Unable to stand Extremities - R hand - severe contractures of digits 3-4 with good range of motion of thumb and index finger with normal strength and sensatioin these digits.  Able to grasp my finger and move in all directions  - L hand - good range of motion and strength in all digits Legs - unable to lift either ankle higher than knee. L ankle with fixed deformity of ankle R ankle - limited range of motion.   Strength 3/5 in ankle flexors and 3/5 in hip flexors - Patient has masked facies  Psych:  Cognition and judgment appear intact. Alert, communicative  and cooperative with normal attention span and concentration. No apparent delusions, illusions, hallucinations   Assessments/Plans  See after visit summary for details of patient instuctions  Abnormality of gait Patient has irreversible gait abnormality due to lower extremity weakness and deformity from DJD and Parkinsons disease.  These preclude her from using a walker or cane. She has contractures in her upper R extremity that would prevent her from using a manual wheel chair or a power scooter. All of the above diseases have also been evaluated by neurology and  Physical Therapy who could not improve her function

## 2018-04-17 NOTE — Telephone Encounter (Signed)
[facility] called to check status. Nayel Purdy, [name], CMA

## 2018-04-18 ENCOUNTER — Encounter: Payer: Self-pay | Admitting: Family Medicine

## 2018-04-18 NOTE — Assessment & Plan Note (Signed)
Patient has irreversible gait abnormality due to lower extremity weakness and deformity from DJD and Parkinsons disease.  These preclude her from using a walker or cane. She has contractures in her upper R extremity that would prevent her from using a manual wheel chair or a power scooter. All of the above diseases have also been evaluated by neurology and Physical Therapy who could not improve her function

## 2018-04-18 NOTE — Telephone Encounter (Signed)
Left VM with the orders

## 2018-04-19 ENCOUNTER — Telehealth: Payer: Self-pay

## 2018-04-19 DIAGNOSIS — M19041 Primary osteoarthritis, right hand: Secondary | ICD-10-CM | POA: Diagnosis not present

## 2018-04-19 DIAGNOSIS — M47817 Spondylosis without myelopathy or radiculopathy, lumbosacral region: Secondary | ICD-10-CM | POA: Diagnosis not present

## 2018-04-19 DIAGNOSIS — M1611 Unilateral primary osteoarthritis, right hip: Secondary | ICD-10-CM | POA: Diagnosis not present

## 2018-04-19 DIAGNOSIS — R471 Dysarthria and anarthria: Secondary | ICD-10-CM | POA: Diagnosis not present

## 2018-04-19 DIAGNOSIS — M810 Age-related osteoporosis without current pathological fracture: Secondary | ICD-10-CM | POA: Diagnosis not present

## 2018-04-19 DIAGNOSIS — M24541 Contracture, right hand: Secondary | ICD-10-CM | POA: Diagnosis not present

## 2018-04-19 NOTE — Telephone Encounter (Signed)
Sharyn Lull, [place] with [facility], called for verbal orders:  HH OT 1x/week x 1 week              2x/week x 3 weeks              1x/week x 1 week  Call back is [phone]

## 2018-04-20 ENCOUNTER — Telehealth: Payer: Self-pay

## 2018-04-20 DIAGNOSIS — M47817 Spondylosis without myelopathy or radiculopathy, lumbosacral region: Secondary | ICD-10-CM | POA: Diagnosis not present

## 2018-04-20 DIAGNOSIS — R471 Dysarthria and anarthria: Secondary | ICD-10-CM | POA: Diagnosis not present

## 2018-04-20 DIAGNOSIS — M810 Age-related osteoporosis without current pathological fracture: Secondary | ICD-10-CM | POA: Diagnosis not present

## 2018-04-20 DIAGNOSIS — M24541 Contracture, right hand: Secondary | ICD-10-CM | POA: Diagnosis not present

## 2018-04-20 DIAGNOSIS — M1611 Unilateral primary osteoarthritis, right hip: Secondary | ICD-10-CM | POA: Diagnosis not present

## 2018-04-20 DIAGNOSIS — M19041 Primary osteoarthritis, right hand: Secondary | ICD-10-CM | POA: Diagnosis not present

## 2018-04-20 NOTE — Telephone Encounter (Signed)
Called [place] and gave the orders below

## 2018-04-20 NOTE — Telephone Encounter (Signed)
Sharyn Lull called again asking for VO, please call [phone].

## 2018-04-20 NOTE — Telephone Encounter (Signed)
Error

## 2018-04-23 DIAGNOSIS — R471 Dysarthria and anarthria: Secondary | ICD-10-CM | POA: Diagnosis not present

## 2018-04-23 DIAGNOSIS — M24541 Contracture, right hand: Secondary | ICD-10-CM | POA: Diagnosis not present

## 2018-04-23 DIAGNOSIS — M810 Age-related osteoporosis without current pathological fracture: Secondary | ICD-10-CM | POA: Diagnosis not present

## 2018-04-23 DIAGNOSIS — M47817 Spondylosis without myelopathy or radiculopathy, lumbosacral region: Secondary | ICD-10-CM | POA: Diagnosis not present

## 2018-04-23 DIAGNOSIS — M19041 Primary osteoarthritis, right hand: Secondary | ICD-10-CM | POA: Diagnosis not present

## 2018-04-23 DIAGNOSIS — M1611 Unilateral primary osteoarthritis, right hip: Secondary | ICD-10-CM | POA: Diagnosis not present

## 2018-04-24 DIAGNOSIS — M1611 Unilateral primary osteoarthritis, right hip: Secondary | ICD-10-CM | POA: Diagnosis not present

## 2018-04-24 DIAGNOSIS — R471 Dysarthria and anarthria: Secondary | ICD-10-CM | POA: Diagnosis not present

## 2018-04-24 DIAGNOSIS — M19041 Primary osteoarthritis, right hand: Secondary | ICD-10-CM | POA: Diagnosis not present

## 2018-04-24 DIAGNOSIS — M810 Age-related osteoporosis without current pathological fracture: Secondary | ICD-10-CM | POA: Diagnosis not present

## 2018-04-24 DIAGNOSIS — M47817 Spondylosis without myelopathy or radiculopathy, lumbosacral region: Secondary | ICD-10-CM | POA: Diagnosis not present

## 2018-04-24 DIAGNOSIS — M24541 Contracture, right hand: Secondary | ICD-10-CM | POA: Diagnosis not present

## 2018-04-26 DIAGNOSIS — M47817 Spondylosis without myelopathy or radiculopathy, lumbosacral region: Secondary | ICD-10-CM | POA: Diagnosis not present

## 2018-04-26 DIAGNOSIS — M810 Age-related osteoporosis without current pathological fracture: Secondary | ICD-10-CM | POA: Diagnosis not present

## 2018-04-26 DIAGNOSIS — M1611 Unilateral primary osteoarthritis, right hip: Secondary | ICD-10-CM | POA: Diagnosis not present

## 2018-04-26 DIAGNOSIS — R471 Dysarthria and anarthria: Secondary | ICD-10-CM | POA: Diagnosis not present

## 2018-04-26 DIAGNOSIS — M24541 Contracture, right hand: Secondary | ICD-10-CM | POA: Diagnosis not present

## 2018-04-26 DIAGNOSIS — M19041 Primary osteoarthritis, right hand: Secondary | ICD-10-CM | POA: Diagnosis not present

## 2018-04-27 DIAGNOSIS — M810 Age-related osteoporosis without current pathological fracture: Secondary | ICD-10-CM | POA: Diagnosis not present

## 2018-04-27 DIAGNOSIS — M19041 Primary osteoarthritis, right hand: Secondary | ICD-10-CM | POA: Diagnosis not present

## 2018-04-27 DIAGNOSIS — R471 Dysarthria and anarthria: Secondary | ICD-10-CM | POA: Diagnosis not present

## 2018-04-27 DIAGNOSIS — M1611 Unilateral primary osteoarthritis, right hip: Secondary | ICD-10-CM | POA: Diagnosis not present

## 2018-04-27 DIAGNOSIS — M24541 Contracture, right hand: Secondary | ICD-10-CM | POA: Diagnosis not present

## 2018-04-27 DIAGNOSIS — M47817 Spondylosis without myelopathy or radiculopathy, lumbosacral region: Secondary | ICD-10-CM | POA: Diagnosis not present

## 2018-04-30 DIAGNOSIS — M810 Age-related osteoporosis without current pathological fracture: Secondary | ICD-10-CM | POA: Diagnosis not present

## 2018-04-30 DIAGNOSIS — R471 Dysarthria and anarthria: Secondary | ICD-10-CM | POA: Diagnosis not present

## 2018-04-30 DIAGNOSIS — M47817 Spondylosis without myelopathy or radiculopathy, lumbosacral region: Secondary | ICD-10-CM | POA: Diagnosis not present

## 2018-04-30 DIAGNOSIS — M24541 Contracture, right hand: Secondary | ICD-10-CM | POA: Diagnosis not present

## 2018-04-30 DIAGNOSIS — M1611 Unilateral primary osteoarthritis, right hip: Secondary | ICD-10-CM | POA: Diagnosis not present

## 2018-04-30 DIAGNOSIS — M19041 Primary osteoarthritis, right hand: Secondary | ICD-10-CM | POA: Diagnosis not present

## 2018-05-01 DIAGNOSIS — M24541 Contracture, right hand: Secondary | ICD-10-CM | POA: Diagnosis not present

## 2018-05-01 DIAGNOSIS — M1611 Unilateral primary osteoarthritis, right hip: Secondary | ICD-10-CM | POA: Diagnosis not present

## 2018-05-01 DIAGNOSIS — M810 Age-related osteoporosis without current pathological fracture: Secondary | ICD-10-CM | POA: Diagnosis not present

## 2018-05-01 DIAGNOSIS — M47817 Spondylosis without myelopathy or radiculopathy, lumbosacral region: Secondary | ICD-10-CM | POA: Diagnosis not present

## 2018-05-01 DIAGNOSIS — R471 Dysarthria and anarthria: Secondary | ICD-10-CM | POA: Diagnosis not present

## 2018-05-01 DIAGNOSIS — M19041 Primary osteoarthritis, right hand: Secondary | ICD-10-CM | POA: Diagnosis not present

## 2018-05-03 DIAGNOSIS — M24541 Contracture, right hand: Secondary | ICD-10-CM | POA: Diagnosis not present

## 2018-05-03 DIAGNOSIS — M47817 Spondylosis without myelopathy or radiculopathy, lumbosacral region: Secondary | ICD-10-CM | POA: Diagnosis not present

## 2018-05-03 DIAGNOSIS — M19041 Primary osteoarthritis, right hand: Secondary | ICD-10-CM | POA: Diagnosis not present

## 2018-05-03 DIAGNOSIS — M1611 Unilateral primary osteoarthritis, right hip: Secondary | ICD-10-CM | POA: Diagnosis not present

## 2018-05-03 DIAGNOSIS — R471 Dysarthria and anarthria: Secondary | ICD-10-CM | POA: Diagnosis not present

## 2018-05-03 DIAGNOSIS — M810 Age-related osteoporosis without current pathological fracture: Secondary | ICD-10-CM | POA: Diagnosis not present

## 2018-05-04 DIAGNOSIS — M810 Age-related osteoporosis without current pathological fracture: Secondary | ICD-10-CM | POA: Diagnosis not present

## 2018-05-04 DIAGNOSIS — M19041 Primary osteoarthritis, right hand: Secondary | ICD-10-CM | POA: Diagnosis not present

## 2018-05-04 DIAGNOSIS — R471 Dysarthria and anarthria: Secondary | ICD-10-CM | POA: Diagnosis not present

## 2018-05-04 DIAGNOSIS — M24541 Contracture, right hand: Secondary | ICD-10-CM | POA: Diagnosis not present

## 2018-05-04 DIAGNOSIS — M1611 Unilateral primary osteoarthritis, right hip: Secondary | ICD-10-CM | POA: Diagnosis not present

## 2018-05-04 DIAGNOSIS — M47817 Spondylosis without myelopathy or radiculopathy, lumbosacral region: Secondary | ICD-10-CM | POA: Diagnosis not present

## 2018-05-08 ENCOUNTER — Telehealth: Payer: Self-pay | Admitting: *Deleted

## 2018-05-08 DIAGNOSIS — R471 Dysarthria and anarthria: Secondary | ICD-10-CM | POA: Diagnosis not present

## 2018-05-08 DIAGNOSIS — M810 Age-related osteoporosis without current pathological fracture: Secondary | ICD-10-CM | POA: Diagnosis not present

## 2018-05-08 DIAGNOSIS — M1611 Unilateral primary osteoarthritis, right hip: Secondary | ICD-10-CM | POA: Diagnosis not present

## 2018-05-08 DIAGNOSIS — M19041 Primary osteoarthritis, right hand: Secondary | ICD-10-CM | POA: Diagnosis not present

## 2018-05-08 DIAGNOSIS — M47817 Spondylosis without myelopathy or radiculopathy, lumbosacral region: Secondary | ICD-10-CM | POA: Diagnosis not present

## 2018-05-08 DIAGNOSIS — M24541 Contracture, right hand: Secondary | ICD-10-CM | POA: Diagnosis not present

## 2018-05-08 NOTE — Telephone Encounter (Signed)
Sharyn Lull needs verbal orders to continue OT as follows:  2 x weekly for 1 week, then  1 x weekly for 1 week.  Will forward to MD. Fleeger, [name], [place]

## 2018-05-08 NOTE — Telephone Encounter (Signed)
Helen Odom from [place] informed of verbal orders for OT. Deseree Kennon Holter, CMA

## 2018-05-08 NOTE — Telephone Encounter (Signed)
Please call in above orders  Thanks  [place]

## 2018-05-10 DIAGNOSIS — M1611 Unilateral primary osteoarthritis, right hip: Secondary | ICD-10-CM | POA: Diagnosis not present

## 2018-05-10 DIAGNOSIS — M810 Age-related osteoporosis without current pathological fracture: Secondary | ICD-10-CM | POA: Diagnosis not present

## 2018-05-10 DIAGNOSIS — R471 Dysarthria and anarthria: Secondary | ICD-10-CM | POA: Diagnosis not present

## 2018-05-10 DIAGNOSIS — M19041 Primary osteoarthritis, right hand: Secondary | ICD-10-CM | POA: Diagnosis not present

## 2018-05-10 DIAGNOSIS — M24541 Contracture, right hand: Secondary | ICD-10-CM | POA: Diagnosis not present

## 2018-05-10 DIAGNOSIS — M47817 Spondylosis without myelopathy or radiculopathy, lumbosacral region: Secondary | ICD-10-CM | POA: Diagnosis not present

## 2018-05-15 DIAGNOSIS — R471 Dysarthria and anarthria: Secondary | ICD-10-CM | POA: Diagnosis not present

## 2018-05-15 DIAGNOSIS — M1611 Unilateral primary osteoarthritis, right hip: Secondary | ICD-10-CM | POA: Diagnosis not present

## 2018-05-15 DIAGNOSIS — M19041 Primary osteoarthritis, right hand: Secondary | ICD-10-CM | POA: Diagnosis not present

## 2018-05-15 DIAGNOSIS — M810 Age-related osteoporosis without current pathological fracture: Secondary | ICD-10-CM | POA: Diagnosis not present

## 2018-05-15 DIAGNOSIS — M47817 Spondylosis without myelopathy or radiculopathy, lumbosacral region: Secondary | ICD-10-CM | POA: Diagnosis not present

## 2018-05-15 DIAGNOSIS — M24541 Contracture, right hand: Secondary | ICD-10-CM | POA: Diagnosis not present

## 2018-05-16 ENCOUNTER — Other Ambulatory Visit: Payer: Self-pay

## 2018-05-16 ENCOUNTER — Ambulatory Visit: Payer: Medicare Other | Admitting: Licensed Clinical Social Worker

## 2018-05-16 ENCOUNTER — Encounter: Payer: Self-pay | Admitting: Family Medicine

## 2018-05-16 ENCOUNTER — Ambulatory Visit (INDEPENDENT_AMBULATORY_CARE_PROVIDER_SITE_OTHER): Payer: Medicare Other | Admitting: Family Medicine

## 2018-05-16 DIAGNOSIS — R269 Unspecified abnormalities of gait and mobility: Secondary | ICD-10-CM

## 2018-05-16 DIAGNOSIS — M79641 Pain in right hand: Secondary | ICD-10-CM

## 2018-05-16 DIAGNOSIS — M47816 Spondylosis without myelopathy or radiculopathy, lumbar region: Secondary | ICD-10-CM | POA: Diagnosis not present

## 2018-05-16 DIAGNOSIS — Z636 Dependent relative needing care at home: Secondary | ICD-10-CM

## 2018-05-16 DIAGNOSIS — F3289 Other specified depressive episodes: Secondary | ICD-10-CM

## 2018-05-16 DIAGNOSIS — M159 Polyosteoarthritis, unspecified: Secondary | ICD-10-CM | POA: Diagnosis not present

## 2018-05-16 MED ORDER — DICLOFENAC POTASSIUM 50 MG PO TABS: 50.0000 mg | ORAL_TABLET | Freq: Two times a day (BID) | ORAL | 3 refills | Status: DC | PRN

## 2018-05-16 MED ORDER — HYDROCORTISONE 2.5 % EX CREA: TOPICAL_CREAM | Freq: Two times a day (BID) | CUTANEOUS | 0 refills | Status: DC

## 2018-05-16 MED ORDER — HYDROCODONE-ACETAMINOPHEN 5-325 MG PO TABS: 1.0000 | ORAL_TABLET | Freq: Three times a day (TID) | ORAL | 0 refills | Status: DC | PRN

## 2018-05-16 NOTE — Progress Notes (Signed)
  Subjective  Helen Odom is a 84 y.o. female is presenting with the following   GAIT ABNORMALITY No change.  Unable to stand without assistance.  Not able to walk with walker.  Feels stiff (Parkinsonisms) and pain (DJD) and weak (deconditioning)  No focal weakness or incontinence.  No falls.  Physical Therapy is coming regularly and she is being evaluated for power WC  ARTHRITIS Disease monitoring Most involved joints: hands, hips.   Joint swelling: no    Joint Redness: no  Limitations: can't open her R hand. Pain in arms and legs Medication Management Compliance:  Using diclofenace and hydrocodone which help some but is usually in pain  Taking the hydrocodone 4 times a day which helps.  No constipation  Abdominal pain: no   GI Bleeding:  no  DEPRESSION Slightly better with Physical Therapy and zoloft (100 mg a day).  Major stressors are her husbands worsening dementia.  See [place] regularly            Objective Vital Signs reviewed BP 128/78   Pulse 73   Temp 98.3 F (36.8 C) (Oral)   LMP 10/14/1988   SpO2 98%  Masked Facies Unable to rise frow WC alone Psych:  Cognition and judgment appear intact. Alert, communicative  and cooperative with normal attention span and concentration. No apparent delusions, illusions, hallucinations   Assessments/Plans  See after visit summary for details of patient instuctions  DJD (degenerative joint disease), multiple sites Unchanged.   Continue current medications   Abnormality of gait Worsening.   Being evaluated for power WC.  Physical Therapy is seeing her   Depression Stable continue medication and counseling      Chief Complaint noted Review of Symptoms - see HPI PMH - Smoking status noted.    Objective Vital Signs reviewed BP 128/78   Pulse 73   Temp 98.3 F (36.8 C) (Oral)   LMP 10/14/1988   SpO2 98%   Assessments/Plans  See after visit summary for details of patient instuctions  DJD (degenerative joint disease),  multiple sites Unchanged.   Continue current medications   Abnormality of gait Worsening.   Being evaluated for power WC.  Physical Therapy is seeing her   Depression Stable continue medication and counseling

## 2018-05-16 NOTE — Assessment & Plan Note (Signed)
Unchanged.   Continue current medications

## 2018-05-16 NOTE — Assessment & Plan Note (Addendum)
Worsening.   Being evaluated for power WC.  Physical Therapy is seeing her

## 2018-05-16 NOTE — Assessment & Plan Note (Signed)
Stable continue medication and counseling

## 2018-05-16 NOTE — Progress Notes (Signed)
Type of Service: [place] F/U Visit Total time:30 minutes :  Interpreter:No.    Reason for follow-up: Continue brief intervention to assist patient with managing caregiver stressors . Reports difficulty in daily functions with husband however is adjusting better. Patient is pleasant and engaged in conversation. She is accompanied by her aide Suanne Marker.  Patient is making progress towards goal.  Goals: Patient will  1. reduce symptoms of: stress ,  2. increase  ability FB:PZWCHE skills, self-management skills and stress reduction,  Intervention: Solution-Focused Strategies, Behavioral Activation and Supportive Counseling, as well as Reflective listening, ; Problem-solving teaching/coping strategies   Issues discussed: adjusting to husband's behavior changes  ; late-stage alzherimers ;  Self-care for patient; new help with aides ; things patient enjoys doing. Assessment:Patient continues to experience difficulty with managing caregiver stress.  She is making progress with self-care and educating self on stages of alzheirmer's.     Plan: Patient will return to meet with LCSW during next F/U visit with PCP.  Casimer Lanius, LCSW Licensed Clinical Social Worker [facility]   [phone] 12:27 PM

## 2018-05-16 NOTE — Patient Instructions (Signed)
Good to see you today!  Thanks for coming in.  Come back when you need a refill of the [place] unless every thing is going ok then just call me  I will call you if your tests are not good.  Otherwise I will send you a letter.  If you do not hear from me with in 2 weeks please call our office.     Ask your neurologist about swallowing and possible medications

## 2018-05-17 ENCOUNTER — Encounter: Payer: Self-pay | Admitting: Family Medicine

## 2018-05-17 DIAGNOSIS — M47817 Spondylosis without myelopathy or radiculopathy, lumbosacral region: Secondary | ICD-10-CM | POA: Diagnosis not present

## 2018-05-17 DIAGNOSIS — M810 Age-related osteoporosis without current pathological fracture: Secondary | ICD-10-CM | POA: Diagnosis not present

## 2018-05-17 DIAGNOSIS — M1611 Unilateral primary osteoarthritis, right hip: Secondary | ICD-10-CM | POA: Diagnosis not present

## 2018-05-17 DIAGNOSIS — M24541 Contracture, right hand: Secondary | ICD-10-CM | POA: Diagnosis not present

## 2018-05-17 DIAGNOSIS — M19041 Primary osteoarthritis, right hand: Secondary | ICD-10-CM | POA: Diagnosis not present

## 2018-05-17 DIAGNOSIS — R471 Dysarthria and anarthria: Secondary | ICD-10-CM | POA: Diagnosis not present

## 2018-05-17 LAB — CBC
HEMOGLOBIN: 11.4 g/dL (ref 11.1–15.9)
Hematocrit: 34.9 % (ref 34.0–46.6)
MCH: 26.8 pg (ref 26.6–33.0)
MCHC: 32.7 g/dL (ref 31.5–35.7)
MCV: 82 fL (ref 79–97)
PLATELETS: 279 10*3/uL (ref 150–450)
RBC: 4.25 x10E6/uL (ref 3.77–5.28)
RDW: 17 % — ABNORMAL HIGH (ref 12.3–15.4)
WBC: 6.8 10*3/uL (ref 3.4–10.8)

## 2018-05-18 ENCOUNTER — Telehealth: Payer: Self-pay | Admitting: *Deleted

## 2018-05-18 NOTE — Telephone Encounter (Signed)
Sharyn Lull needs verbal orders to Extend OT for the following:  2 x weekly for 2 weeks, then 1 x weekly for 1 week  Will forward to MD. Clinton Sawyer, [name], [place]

## 2018-05-18 NOTE — Telephone Encounter (Signed)
Please call in the verbal orders below  [place]

## 2018-05-18 NOTE — Telephone Encounter (Signed)
Michelle informed. Fleeger, Jessica Dawn, CMA  

## 2018-05-22 ENCOUNTER — Telehealth: Payer: Self-pay | Admitting: *Deleted

## 2018-05-22 ENCOUNTER — Encounter: Payer: Self-pay | Admitting: Neurology

## 2018-05-22 ENCOUNTER — Ambulatory Visit (INDEPENDENT_AMBULATORY_CARE_PROVIDER_SITE_OTHER): Payer: Medicare Other | Admitting: Neurology

## 2018-05-22 VITALS — BP 110/72 | HR 88

## 2018-05-22 DIAGNOSIS — R1310 Dysphagia, unspecified: Secondary | ICD-10-CM | POA: Diagnosis not present

## 2018-05-22 DIAGNOSIS — R471 Dysarthria and anarthria: Secondary | ICD-10-CM | POA: Diagnosis not present

## 2018-05-22 DIAGNOSIS — M24541 Contracture, right hand: Secondary | ICD-10-CM | POA: Diagnosis not present

## 2018-05-22 DIAGNOSIS — M47817 Spondylosis without myelopathy or radiculopathy, lumbosacral region: Secondary | ICD-10-CM | POA: Diagnosis not present

## 2018-05-22 DIAGNOSIS — M19041 Primary osteoarthritis, right hand: Secondary | ICD-10-CM | POA: Diagnosis not present

## 2018-05-22 DIAGNOSIS — M810 Age-related osteoporosis without current pathological fracture: Secondary | ICD-10-CM | POA: Diagnosis not present

## 2018-05-22 DIAGNOSIS — G20 Parkinson's disease: Secondary | ICD-10-CM | POA: Diagnosis not present

## 2018-05-22 DIAGNOSIS — M1611 Unilateral primary osteoarthritis, right hip: Secondary | ICD-10-CM | POA: Diagnosis not present

## 2018-05-22 MED ORDER — CARBIDOPA-LEVODOPA 25-100 MG PO TABS: 0.5000 | ORAL_TABLET | Freq: Three times a day (TID) | ORAL | 11 refills | Status: DC

## 2018-05-22 NOTE — Progress Notes (Signed)
[facility]    Provider:  Dr Jaynee Eagles Referring Provider: Lind Covert, * Primary Care Physician:  Lind Covert, MD  CC:  Tenderness, swelling of the joints and hand  Interval history 05/22/2018: She feels she is worsening, she cannot walk, she is PT and OT right now, also speech. Speech is slurring. Her posture is poor, she slants to the side. We tried Sinemet in the past. She sometimes chokes on food will order a swallow study. Difficulty with speech, no memory problems, Not lightheaded, not walking, having problems with saliva, tremor continues and now in the legs, having difficulty writing and using a spoon no resting tremor but more with action. Will try Sinemet again.   Interval history 07/20/2017: Patient is here for a new problem, pain in the right hand which started 3 weeks ago. She has swelling, pain on palpation and pain on movement, mostly affecting digits 3-5 and will point tenderness at the joints.  Her care provider is with her and provides supportive information. Reviewed notes, patient experiencing stress at home caring for her husband with dementia, Dr. Erin Hearing evaluated right hand pain, PT not helping much, patient unclear on trigers, pain is continuous and severe with swelling, unknown inciting events.   Interval History 03/08/2017: Patient returns today for follow-up. She also been diagnosed with parkinsonism past. Did not tolerate Sinemet.She is walking with a walker which is improved. The tremor "comes and goes". Can try low dose neurontin, she was on that in the past without side effects. 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 No injury. Started gradually about 3 weeks ago, definitely new and worsening for 2 weeks. She has been rying to exercise, feels like the hand is swollen and red.   Interval history 11/19/2014:Helen Odom is here with her aunt Layaan Mott. Patient has been diagnosed with parkinsonism but has had a  tremor for 5 years in arms andboth legs more consistent with essential tremor. She reports she has "foot jumping" of the right foot which is helped by Norco pain medication. She has tremors in the hands which is not at rest but is worse with eating andtrying to get food to her mouth. Walking is difficult, her left leg doesn't seem to "listen to her". She had a fall in December. She says her knee gave way in the restroom and her leg gave out and she fell. The right leg gave way. She has terrible arthritis which also impacts her walking. She is in a wheelchair and can't walk currently since her fall. Her mother had a tremor as well. The tremor impacts her eating and it gets to the point that it is too much trouble to eat. She is spilling things due to the tremor. No significant resting tremor. More of an action tremor in the arms. She can stop the leg shaking voluntarily. Memory is fine, denies any memory issues. She has difficulty walking, her voice is softer, walking slower and recently she can't walk without significant assistance and has some stooping of her posture. Her left leg is more affected with movement, it does not respond to her brain per patient. Niece provides much information  Reviewed previous records:  HISTORY OF PRESENT ILLNESS, Dr. Felecia Shelling 11/19/2014 :  I had the pleasure seeing you patient,Helen Odom, at [facility] for a neurologic consultation regarding her parkinsonism. She recently moved to the [place] area with her husband. About a year and a half ago, she noted tremor in  the right leg and the left greater than right arm. She went to a neurologist who diagnosed her with parkinsonism. He prescribed carbidopa levodopa. However, she took only a couple doses as it made her very nauseous. No other medications were tried. She noted mild weakness in her legs at that time. She feels her arms are not as weak as the legs but they tire out easily. The tremor in her  leg is variable, often depending on how she is sitting (worse when ankle is more extended). It is less likely to occur while standing. It is always worse on her right and sometimes just present on the right. The tremor in the arms (left worse than right) are different than the tremors in the legs. The hand tremors are worse when she tries to hold still (like writing and eating). They are less likely to occur at rest.   The weakness in her legs has increased and she feels weaker over the past few months. She is having a lot of difficulty getting out of chairs. The tremors have also worsened. Her gait has progressively worsened over the last couple of years. Specifically, her stride has become shorter and her balance is more unsteady. She is very careful so she does not fall over the past couple of months she has had more difficulty getting out of a chair and how her caregivers help her stand. When she stands she is able to walk but with a small stride. For longer distances she uses a wheelchair.  She denies any bladder or bowel changes. She has not had any incontinence.  Her mother had a tremor at times when she was older.  Social History   Socioeconomic History  . Marital status: Married    Spouse name: Not on file  . Number of children: Not on file  . Years of education: Not on file  . Highest education level: Not on file  Occupational History  . Not on file  Social Needs  . Financial resource strain: Not on file  . Food insecurity:    Worry: Not on file    Inability: Not on file  . Transportation needs:    Medical: Not on file    Non-medical: Not on file  Tobacco Use  . Smoking status: Never Smoker  . Smokeless tobacco: Never Used  Substance and Sexual Activity  . Alcohol use: Yes    Alcohol/week: 0.0 oz    Comment: [place] about once per month  . Drug use: No  . Sexual activity: Never  Lifestyle  . Physical activity:    Days per week: Not on file    Minutes  per session: Not on file  . Stress: Not on file  Relationships  . Social connections:    Talks on phone: Not on file    Gets together: Not on file    Attends religious service: Not on file    Active member of club or organization: Not on file    Attends meetings of clubs or organizations: Not on file    Relationship status: Not on file  . Intimate partner violence:    Fear of current or ex partner: Not on file    Emotionally abused: Not on file    Physically abused: Not on file    Forced sexual activity: Not on file  Other Topics Concern  . Not on file  Social History Narrative   Lives with her husband who has moderate Alz.   They are in  an apartment with 24 hour assistance.   Her niece [name] is her most local relation.  She is in IT at [employer].      Current Social History   04/18/2017   Who lives at home: Husband, [name] with worsening alzheimer's and 24 hour aids 04/18/2017    Transportation: Has own transportation and aids take her to appts 04/18/2017   Important Relationships & Pets: Nieces Rod Holler and [place], Hankins Ed and Maudie Mercury (Aid); no pets 04/18/2017    Current Stressors: Husband with worsening Alzheimer's, patient's health and employee problems 04/18/2017   Work / Education:  Never worked outside home/completed HS 04/18/2017   Religious / Personal Beliefs: "I believe in God" 04/18/2017   Interests / Fun: Shopping on-line 04/18/2017   L. Ducatte, RN, BSN                                                                                                        Family History  Problem Relation Age of Onset  . Congestive Heart Failure Mother        Deceased at 53 yo  . CAD Father   . Heart attack Father 16       Deceased  . Atrial fibrillation Brother     Past Medical History:  Diagnosis Date  . Arthritis   . Cancer ([place])   . Colon cancer ([place])   . Depression   . Heart murmur   . Hyperlipidemia   . Movement disorder   . Neuropathy   . Osteoporosis   . Vision  abnormalities     Past Surgical History:  Procedure Laterality Date  . CATARACT EXTRACTION, BILATERAL Bilateral 2015  . CHOLECYSTECTOMY    . COLON RESECTION    . HIP PINNING,CANNULATED Right 10/13/2016   Procedure: RIGHT HIP PINNING;  Surgeon: Renette Butters, MD;  Location: [facility];  Service: Orthopedics;  Laterality: Right;    Current Outpatient Medications  Medication Sig Dispense Refill  . acetaminophen (TYLENOL) 325 MG tablet Take 2 tablets (650 mg total) by mouth every 6 (six) hours as needed for mild pain (or Fever >/= 101). 30 tablet 0  . aspirin EC 81 MG tablet Take 81 mg by mouth daily.    . cholecalciferol (VITAMIN D) 1000 UNITS tablet Take 1,000 Units by mouth daily.    . diclofenac (CATAFLAM) 50 MG tablet Take 1 tablet (50 mg total) by mouth 2 (two) times daily as needed. 60 tablet 3  . esomeprazole (NEXIUM) 20 MG capsule Take 20 mg by mouth daily at 12 noon.    . gabapentin (NEURONTIN) 100 MG capsule Take 1 capsule (100 mg total) by mouth 3 (three) times daily. 90 capsule 6  . HYDROcodone-acetaminophen (NORCO/VICODIN) 5-325 MG tablet Take 1 tablet by mouth every 8 (eight) hours as needed for moderate pain. 120 tablet 0  . hydrocortisone 2.5 % cream Apply topically 2 (two) times daily. 30 g 0  . loratadine (CLARITIN) 10 MG tablet Take 10 mg by mouth daily.    . sertraline (ZOLOFT) 100 MG tablet Take 1 tablet (  100 mg total) daily by mouth. 30 tablet 6  . vitamin B-12 (CYANOCOBALAMIN) 1000 MCG tablet Take 1,000 mcg by mouth daily.    . carbidopa-levodopa (SINEMET IR) 25-100 MG tablet Take 0.5 tablets by mouth 3 (three) times daily. Take 4 hours apart.Separate Sinemet from protein-rich foods by 30-60 mins. 90 tablet 11   No current facility-administered medications for this visit.     Allergies as of 05/22/2018 - Review Complete 05/22/2018  Allergen Reaction Noted  . Flu virus vaccine Swelling 10/14/2014  . Penicillins Rash 10/14/2014    Vitals: BP 110/72   Pulse 88    LMP 10/14/1988  Last Weight:  Wt Readings from Last 1 Encounters:  05/24/17 166 lb (75.3 kg)   Last Height:   Ht Readings from Last 1 Encounters:  04/17/18 5' (1.524 m)    Neurologic Exam  Mental status: The patient is alert and oriented x 3 at the time of the examination. The patient has apparent normal recent and remote memory, with an apparently normal attention span and concentration ability. Speech is normal.  Cranial nerves:Hypomimia, Extraocular movements are full. Pupils are equal, round, and reactive to lightand accomodation. Visual fields are full.Facial symmetry is present. There is good facial sensationto soft touch bilaterally.Facial strengthis normal. Trapezius and sternocleidomastoid strength is normal. No dysarthria is noted. The tongue is midline, and the patient has symmetric elevation of the soft palate. No obvious hearing deficits are noted.  Motor:She has a 7 Hz essential tremor in both hands that is intensified by sustained posture.The left leg appears to have more normal tone. There is bilateral hip flexion weakness otherwise strength appears symmetrical and intact. Axial weakness.   Tone: cogwheeling in the upper extremities, increased tone right leg  Sensory:Sensory testing is intact to pinprick, soft touch, vibration sensation, and position sense on all 4 extremities.  Coordination:Cerebellar testing reveals good finger-nose-finger but reduced heel-to-shin bilaterally possibly due to weakness  Gait and station:She cannot stand without significant assistance, posture is slumoed  Reflexes:Deep tendon reflexes increased in her legs and she has clonus at the right ankle. There is spread from the right knee. Reflexes in the arms are brisk without spread. Plantar responses are equivocal.  MSK: Right hand swelling, tenderness on palpation of the joints, pain on finger 3-5 movement.    Assessment/Plan:  Patient with parkinsonism here for follow  up. May be Parkinson's disease  however she has refused workup and cannot rule out other causes of parkinsonism.  - Patient has a past medical history of parkinsonism as well as essential tremor in the hands.  No resting tremor.  Exam does show clonus in the right leg increased tone in the upper extremities with cogwheeling in the left arm, hypomania and a mild essential tremor in her hands.  This is concerning for a spinal cord process of the cervical spondylosis with myelopathy or a stroke or other lesion in the brain versus parkinsonism pr Parkinson's spectrum disorders but patient has refused imaging in the past and did not tolerate medications.  - Could try Sinemet low dose again, difficult to diagnose because of parkinsonism patient refuses work-up in particular imaging of the brain and cervical spine  -Tremor in the hands appears to be benign essential tremor.  Tried very low-dose gabapentin for her essential tremor.  -Patient's gait improved with physical therapy in the past but currently no improvement  - dysphagia: swallow study  - The joint swelling, point tenderness of the joints is not consistant with parksinsons dystonia.  The joints are swollen, there is significant pain on palpation of the PIP/DIP more than MCPs and more in digits 3-5. Botox will not improve this condition. Xray ordered. Consulted with Dr. Krista Blue.  X-ray of the hand did show extensive arthritic changes confirming suspicion that this is arthritic.  Addendum: pain. An xray today confirms severe arthritis in the fingers where she has most pain, I think it is arthritic.   XR Hand  personally reviewed imaging: : 1. No definite acute fracture or dislocation identified about the right hand. 2. Advanced degenerative osteoarthrosis, greatest within the PIP and DIP joints, and most severe at the third through fifth digits. 3. Osteopenia.   Meds ordered this encounter  Medications  . carbidopa-levodopa (SINEMET IR) 25-100 MG  tablet    Sig: Take 0.5 tablets by mouth 3 (three) times daily. Take 4 hours apart.Separate Sinemet from protein-rich foods by 30-60 mins.    Dispense:  90 tablet    Refill:  11    Orders Placed This Encounter  Procedures  . DG Swallowing Func-Speech Pathology  . SLP modified barium swallow     Sarina Ill, MD  [facility] [address] [place], [place] [address]  Phone [phone] Fax [phone]  A total of 25 minutes was spent face-to-face with this patient. Over half this time was spent on counseling patient on the parkinsonism diagnosis and different diagnostic and therapeutic options available.

## 2018-05-22 NOTE — Patient Instructions (Signed)
Sinemet: Take 1/2 tablets 3x a day separate by 4 hours (example 7am, 11am, 3pm). Try to separate Sinemet from food (especially protein-rich foods like meat, dairy, eggs) by about 30-60 mins - this will help the absorption of the medication. If you have some nausea with the medication, you can take it with some light food like crackers or ginger ale  Carbidopa; Levodopa tablets What is this medicine? CARBIDOPA;LEVODOPA (kar bi DOE pa; lee voe DOE pa) is used to treat the symptoms of Parkinson's disease. This medicine may be used for other purposes; ask your health care provider or pharmacist if you have questions. COMMON BRAND NAME(S): Atamet, SINEMET What should I tell my health care provider before I take this medicine? They need to know if you have any of these conditions: -asthma or lung disease -depression or other mental illness -diabetes -glaucoma -heart disease, including history of a heart attack -irregular heart beat -kidney or liver disease -melanoma or suspicious skin lesions -stomach or intestine ulcers -an unusual or allergic reaction to levodopa, carbidopa, other medicines, foods, dyes, or preservatives -pregnant or trying to get pregnant -breast-feeding How should I use this medicine? Take this medicine by mouth with a glass of water. Follow the directions on the prescription label. Take your doses at regular intervals. Do not take your medicine more often than directed. Do not stop taking except on the advice of your doctor or health care professional. Talk to your pediatrician regarding the use of this medicine in children. Special care may be needed. Overdosage: If you think you have taken too much of this medicine contact a poison control center or emergency room at once. NOTE: This medicine is only for you. Do not share this medicine with others. What if I miss a dose? If you miss a dose, take it as soon as you can. If it is almost time for your next dose, take only  that dose. Do not take double or extra doses. What may interact with this medicine? Do not take this medicine with any of the following medications: -MAOIs like Marplan, Nardil, and Parnate -reserpine -tetrabenazine This medicine may also interact with the following medications: -alcohol -droperidol -entacapone -iron supplements or multivitamins with iron -isoniazid, INH -linezolid -medicines for depression, anxiety, or psychotic disturbances -medicines for high blood pressure -medicines for sleep -metoclopramide -papaverine -procarbazine -tedizolid -rasagiline -selegiline -tolcapone This list may not describe all possible interactions. Give your health care provider a list of all the medicines, herbs, non-prescription drugs, or dietary supplements you use. Also tell them if you smoke, drink alcohol, or use illegal drugs. Some items may interact with your medicine. What should I watch for while using this medicine? Visit your doctor or health care professional for regular checks on your progress. It may be several weeks or months before you feel the full benefits of this medicine. Continue to take your medicine on a regular schedule. Do not take any additional medicines for Parkinson's disease without first consulting with your health care provider. You may experience a wearing of effect prior to the time for your next dose of this medicine. You may also experience an on-off effect where the medicine apparently stops working for anything from a minute to several hours, then suddenly starts working again. Tell your doctor or health care professional if any of these symptoms happen to you. Your dose may need to be changed. A high protein diet can slow or prevent absorption of this medicine. Avoid high protein foods near the time  of taking this medicine to help to prevent these problems. Take this medicine at least 30 minutes before eating or one hour after meals. You may want to eat higher  protein foods later in the day or in small amounts. Discuss your diet with your doctor or health care professional or nutritionist. You may get drowsy or dizzy. Do not drive, use machinery, or do anything that needs mental alertness until you know how this drug affects you. Do not stand or sit up quickly, especially if you are an older patient. This reduces the risk of dizzy or fainting spells. Alcohol can make you more drowsy and dizzy. Avoid alcoholic drinks. If you find that you have sudden feelings of wanting to sleep during normal activities, like cooking, watching television, or while driving or riding in a car, you should contact your health care professional. If you are diabetic, this medicine may interfere with the accuracy of some tests for sugar or ketones in the urine (does not interfere with blood tests). Check with your doctor or health care professional before changing the dose of your diabetic medicine. This medicine may discolor the urine or sweat, making it look darker or red in color. This is of no cause for concern. However, this may stain clothing or fabrics. There have been reports of increased sexual urges or other strong urges such as gambling while taking some medicines for Parkinson's disease. If you experience any of these urges while taking this medicine, you should report it to your health care provider as soon as possible. You should check your skin often for changes to moles and new growths while taking this medicine. Call your doctor if you notice any of these changes. What side effects may I notice from receiving this medicine? Side effects that you should report to your doctor or health care professional as soon as possible: -allergic reactions like skin rash, itching or hives, swelling of the face, lips, or tongue -anxiety, confusion, or nervousness -falling asleep during normal activities like driving -fast, irregular heartbeat -hallucination, loss of contact with  reality -mood changes like aggressive behavior, depression -stomach pain -trouble passing urine -uncontrolled movements of the mouth, head, hands, feet, shoulders, eyelids or other unusual muscle movements Side effects that usually do not require medical attention (report to your doctor or health care professional if they continue or are bothersome): -headache -loss of appetite -muscle twitches -nausea, vomiting -nightmares, trouble sleeping -unusually weak or tired This list may not describe all possible side effects. Call your doctor for medical advice about side effects. You may report side effects to FDA at 1-800-FDA-1088. Where should I keep my medicine? Keep out of the reach of children. Store at room temperature between 15 and 30 degrees C (59 and 86 degrees F). Protect from light. Throw away any unused medicine after the expiration date. NOTE: This sheet is a summary. It may not cover all possible information. If you have questions about this medicine, talk to your doctor, pharmacist, or health care provider.  2018 Elsevier/Gold Standard (2013-12-24 15:41:53)

## 2018-05-22 NOTE — Telephone Encounter (Signed)
[name] needs verbal orders for the following:  1. Re-certification for service period  2. Continue 2 times weekly for 2 weeks  You can LM on her VM with the verbal orders. Fleeger, [name], CMA

## 2018-05-23 ENCOUNTER — Other Ambulatory Visit (HOSPITAL_COMMUNITY): Payer: Self-pay | Admitting: Neurology

## 2018-05-23 ENCOUNTER — Other Ambulatory Visit: Payer: Self-pay | Admitting: Family Medicine

## 2018-05-23 DIAGNOSIS — R1310 Dysphagia, unspecified: Secondary | ICD-10-CM

## 2018-05-23 MED ORDER — DICLOFENAC SODIUM 50 MG PO TBEC: 50.0000 mg | DELAYED_RELEASE_TABLET | Freq: Two times a day (BID) | ORAL | 2 refills | Status: DC

## 2018-05-23 NOTE — Telephone Encounter (Signed)
Please call in these orders  Thanks  [facility]  

## 2018-05-23 NOTE — Telephone Encounter (Signed)
VO given to [name]  

## 2018-05-24 DIAGNOSIS — M19041 Primary osteoarthritis, right hand: Secondary | ICD-10-CM | POA: Diagnosis not present

## 2018-05-24 DIAGNOSIS — M810 Age-related osteoporosis without current pathological fracture: Secondary | ICD-10-CM | POA: Diagnosis not present

## 2018-05-24 DIAGNOSIS — R471 Dysarthria and anarthria: Secondary | ICD-10-CM | POA: Diagnosis not present

## 2018-05-24 DIAGNOSIS — M47817 Spondylosis without myelopathy or radiculopathy, lumbosacral region: Secondary | ICD-10-CM | POA: Diagnosis not present

## 2018-05-24 DIAGNOSIS — M1611 Unilateral primary osteoarthritis, right hip: Secondary | ICD-10-CM | POA: Diagnosis not present

## 2018-05-24 DIAGNOSIS — M24541 Contracture, right hand: Secondary | ICD-10-CM | POA: Diagnosis not present

## 2018-05-25 ENCOUNTER — Other Ambulatory Visit (HOSPITAL_COMMUNITY): Payer: Self-pay | Admitting: Neurology

## 2018-05-25 ENCOUNTER — Encounter: Payer: Self-pay | Admitting: Neurology

## 2018-05-25 DIAGNOSIS — R1310 Dysphagia, unspecified: Secondary | ICD-10-CM

## 2018-05-28 DIAGNOSIS — M810 Age-related osteoporosis without current pathological fracture: Secondary | ICD-10-CM | POA: Diagnosis not present

## 2018-05-28 DIAGNOSIS — M24541 Contracture, right hand: Secondary | ICD-10-CM | POA: Diagnosis not present

## 2018-05-28 DIAGNOSIS — M1611 Unilateral primary osteoarthritis, right hip: Secondary | ICD-10-CM | POA: Diagnosis not present

## 2018-05-28 DIAGNOSIS — M19041 Primary osteoarthritis, right hand: Secondary | ICD-10-CM | POA: Diagnosis not present

## 2018-05-28 DIAGNOSIS — M47817 Spondylosis without myelopathy or radiculopathy, lumbosacral region: Secondary | ICD-10-CM | POA: Diagnosis not present

## 2018-05-28 DIAGNOSIS — R471 Dysarthria and anarthria: Secondary | ICD-10-CM | POA: Diagnosis not present

## 2018-05-29 ENCOUNTER — Inpatient Hospital Stay (HOSPITAL_COMMUNITY): Admission: RE | Admit: 2018-05-29 | Payer: Medicare Other | Source: Ambulatory Visit

## 2018-05-29 ENCOUNTER — Ambulatory Visit (HOSPITAL_COMMUNITY): Payer: Medicare Other

## 2018-05-29 DIAGNOSIS — M810 Age-related osteoporosis without current pathological fracture: Secondary | ICD-10-CM | POA: Diagnosis not present

## 2018-05-29 DIAGNOSIS — M47817 Spondylosis without myelopathy or radiculopathy, lumbosacral region: Secondary | ICD-10-CM | POA: Diagnosis not present

## 2018-05-29 DIAGNOSIS — M24541 Contracture, right hand: Secondary | ICD-10-CM | POA: Diagnosis not present

## 2018-05-29 DIAGNOSIS — M1611 Unilateral primary osteoarthritis, right hip: Secondary | ICD-10-CM | POA: Diagnosis not present

## 2018-05-29 DIAGNOSIS — R471 Dysarthria and anarthria: Secondary | ICD-10-CM | POA: Diagnosis not present

## 2018-05-29 DIAGNOSIS — M19041 Primary osteoarthritis, right hand: Secondary | ICD-10-CM | POA: Diagnosis not present

## 2018-05-31 ENCOUNTER — Telehealth: Payer: Self-pay | Admitting: Neurology

## 2018-05-31 DIAGNOSIS — M1611 Unilateral primary osteoarthritis, right hip: Secondary | ICD-10-CM | POA: Diagnosis not present

## 2018-05-31 DIAGNOSIS — M19041 Primary osteoarthritis, right hand: Secondary | ICD-10-CM | POA: Diagnosis not present

## 2018-05-31 DIAGNOSIS — R471 Dysarthria and anarthria: Secondary | ICD-10-CM | POA: Diagnosis not present

## 2018-05-31 DIAGNOSIS — M24541 Contracture, right hand: Secondary | ICD-10-CM | POA: Diagnosis not present

## 2018-05-31 DIAGNOSIS — M47817 Spondylosis without myelopathy or radiculopathy, lumbosacral region: Secondary | ICD-10-CM | POA: Diagnosis not present

## 2018-05-31 DIAGNOSIS — M810 Age-related osteoporosis without current pathological fracture: Secondary | ICD-10-CM | POA: Diagnosis not present

## 2018-05-31 NOTE — Telephone Encounter (Signed)
Patient Cx her swallow study apt . I called her to see if she was going to reschedule patient stated she has the number and she was going to call and and reschedule .

## 2018-06-04 ENCOUNTER — Telehealth: Payer: Self-pay

## 2018-06-04 NOTE — Telephone Encounter (Signed)
Michelle informed. Fleeger, Jessica Dawn, CMA  

## 2018-06-04 NOTE — Telephone Encounter (Signed)
Please call in above orders Thanks [place]

## 2018-06-04 NOTE — Telephone Encounter (Signed)
Sharyn Lull, OT with [facility], needs following verbal orders:  Continue home health OT 2x/week x one week and recertify for next certification period.  Call back: [phone]  Danley Danker, RN [facility])

## 2018-06-05 DIAGNOSIS — M47817 Spondylosis without myelopathy or radiculopathy, lumbosacral region: Secondary | ICD-10-CM | POA: Diagnosis not present

## 2018-06-05 DIAGNOSIS — M1611 Unilateral primary osteoarthritis, right hip: Secondary | ICD-10-CM | POA: Diagnosis not present

## 2018-06-05 DIAGNOSIS — M810 Age-related osteoporosis without current pathological fracture: Secondary | ICD-10-CM | POA: Diagnosis not present

## 2018-06-05 DIAGNOSIS — M24541 Contracture, right hand: Secondary | ICD-10-CM | POA: Diagnosis not present

## 2018-06-05 DIAGNOSIS — R471 Dysarthria and anarthria: Secondary | ICD-10-CM | POA: Diagnosis not present

## 2018-06-05 DIAGNOSIS — M19041 Primary osteoarthritis, right hand: Secondary | ICD-10-CM | POA: Diagnosis not present

## 2018-06-07 ENCOUNTER — Telehealth: Payer: Self-pay | Admitting: Family Medicine

## 2018-06-07 DIAGNOSIS — H35372 Puckering of macula, left eye: Secondary | ICD-10-CM | POA: Diagnosis not present

## 2018-06-07 DIAGNOSIS — M1611 Unilateral primary osteoarthritis, right hip: Secondary | ICD-10-CM | POA: Diagnosis not present

## 2018-06-07 DIAGNOSIS — M810 Age-related osteoporosis without current pathological fracture: Secondary | ICD-10-CM | POA: Diagnosis not present

## 2018-06-07 DIAGNOSIS — M19041 Primary osteoarthritis, right hand: Secondary | ICD-10-CM | POA: Diagnosis not present

## 2018-06-07 DIAGNOSIS — H35341 Macular cyst, hole, or pseudohole, right eye: Secondary | ICD-10-CM | POA: Diagnosis not present

## 2018-06-07 DIAGNOSIS — H43813 Vitreous degeneration, bilateral: Secondary | ICD-10-CM | POA: Diagnosis not present

## 2018-06-07 DIAGNOSIS — R471 Dysarthria and anarthria: Secondary | ICD-10-CM | POA: Diagnosis not present

## 2018-06-07 DIAGNOSIS — M24541 Contracture, right hand: Secondary | ICD-10-CM | POA: Diagnosis not present

## 2018-06-07 DIAGNOSIS — M47817 Spondylosis without myelopathy or radiculopathy, lumbosacral region: Secondary | ICD-10-CM | POA: Diagnosis not present

## 2018-06-07 NOTE — Telephone Encounter (Signed)
Physical Therapist [name] from [facility] called and would like to have continued physical therapy renewed for 2 times a week for 5 weeks.    She needs these for -Strengthening -Safe transferring  -Balance training -home exercise program -safety fall precautions

## 2018-06-08 ENCOUNTER — Telehealth: Payer: Self-pay

## 2018-06-08 NOTE — Telephone Encounter (Signed)
Verbal orders given to [facility]. Symia Herdt,CMA

## 2018-06-08 NOTE — Telephone Encounter (Signed)
See newest phone note.  Verbal orders called in. [facility]

## 2018-06-08 NOTE — Telephone Encounter (Signed)
Please call in these orders  Thanks  [facility]  

## 2018-06-08 NOTE — Telephone Encounter (Signed)
Sharyn Lull, OT with [facility], called nurse line requesting verbal orders for continued [place].   2x weekly for 3 weeks  1x weekly for 1 week   Please call Sharyn Lull with VO. [phone].

## 2018-06-08 NOTE — Telephone Encounter (Signed)
Please call in order for this Thanks  [place]

## 2018-06-10 DIAGNOSIS — M810 Age-related osteoporosis without current pathological fracture: Secondary | ICD-10-CM | POA: Diagnosis not present

## 2018-06-10 DIAGNOSIS — M19041 Primary osteoarthritis, right hand: Secondary | ICD-10-CM | POA: Diagnosis not present

## 2018-06-10 DIAGNOSIS — Z7982 Long term (current) use of aspirin: Secondary | ICD-10-CM | POA: Diagnosis not present

## 2018-06-10 DIAGNOSIS — F329 Major depressive disorder, single episode, unspecified: Secondary | ICD-10-CM | POA: Diagnosis not present

## 2018-06-10 DIAGNOSIS — R471 Dysarthria and anarthria: Secondary | ICD-10-CM | POA: Diagnosis not present

## 2018-06-10 DIAGNOSIS — R499 Unspecified voice and resonance disorder: Secondary | ICD-10-CM | POA: Diagnosis not present

## 2018-06-10 DIAGNOSIS — M1611 Unilateral primary osteoarthritis, right hip: Secondary | ICD-10-CM | POA: Diagnosis not present

## 2018-06-10 DIAGNOSIS — Z79891 Long term (current) use of opiate analgesic: Secondary | ICD-10-CM | POA: Diagnosis not present

## 2018-06-10 DIAGNOSIS — M47817 Spondylosis without myelopathy or radiculopathy, lumbosacral region: Secondary | ICD-10-CM | POA: Diagnosis not present

## 2018-06-10 DIAGNOSIS — M24541 Contracture, right hand: Secondary | ICD-10-CM | POA: Diagnosis not present

## 2018-06-10 DIAGNOSIS — Z9181 History of falling: Secondary | ICD-10-CM | POA: Diagnosis not present

## 2018-06-12 DIAGNOSIS — M19041 Primary osteoarthritis, right hand: Secondary | ICD-10-CM | POA: Diagnosis not present

## 2018-06-12 DIAGNOSIS — R471 Dysarthria and anarthria: Secondary | ICD-10-CM | POA: Diagnosis not present

## 2018-06-12 DIAGNOSIS — M47817 Spondylosis without myelopathy or radiculopathy, lumbosacral region: Secondary | ICD-10-CM | POA: Diagnosis not present

## 2018-06-12 DIAGNOSIS — M1611 Unilateral primary osteoarthritis, right hip: Secondary | ICD-10-CM | POA: Diagnosis not present

## 2018-06-12 DIAGNOSIS — M24541 Contracture, right hand: Secondary | ICD-10-CM | POA: Diagnosis not present

## 2018-06-12 DIAGNOSIS — M810 Age-related osteoporosis without current pathological fracture: Secondary | ICD-10-CM | POA: Diagnosis not present

## 2018-06-13 DIAGNOSIS — M19041 Primary osteoarthritis, right hand: Secondary | ICD-10-CM | POA: Diagnosis not present

## 2018-06-13 DIAGNOSIS — M47817 Spondylosis without myelopathy or radiculopathy, lumbosacral region: Secondary | ICD-10-CM | POA: Diagnosis not present

## 2018-06-13 DIAGNOSIS — R471 Dysarthria and anarthria: Secondary | ICD-10-CM | POA: Diagnosis not present

## 2018-06-13 DIAGNOSIS — M1611 Unilateral primary osteoarthritis, right hip: Secondary | ICD-10-CM | POA: Diagnosis not present

## 2018-06-13 DIAGNOSIS — M810 Age-related osteoporosis without current pathological fracture: Secondary | ICD-10-CM | POA: Diagnosis not present

## 2018-06-13 DIAGNOSIS — M24541 Contracture, right hand: Secondary | ICD-10-CM | POA: Diagnosis not present

## 2018-06-19 ENCOUNTER — Ambulatory Visit: Payer: Medicare Other | Admitting: Neurology

## 2018-06-19 DIAGNOSIS — R471 Dysarthria and anarthria: Secondary | ICD-10-CM | POA: Diagnosis not present

## 2018-06-19 DIAGNOSIS — M47817 Spondylosis without myelopathy or radiculopathy, lumbosacral region: Secondary | ICD-10-CM | POA: Diagnosis not present

## 2018-06-19 DIAGNOSIS — M24541 Contracture, right hand: Secondary | ICD-10-CM | POA: Diagnosis not present

## 2018-06-19 DIAGNOSIS — M19041 Primary osteoarthritis, right hand: Secondary | ICD-10-CM | POA: Diagnosis not present

## 2018-06-19 DIAGNOSIS — M810 Age-related osteoporosis without current pathological fracture: Secondary | ICD-10-CM | POA: Diagnosis not present

## 2018-06-19 DIAGNOSIS — M1611 Unilateral primary osteoarthritis, right hip: Secondary | ICD-10-CM | POA: Diagnosis not present

## 2018-06-20 DIAGNOSIS — M47817 Spondylosis without myelopathy or radiculopathy, lumbosacral region: Secondary | ICD-10-CM | POA: Diagnosis not present

## 2018-06-20 DIAGNOSIS — M24541 Contracture, right hand: Secondary | ICD-10-CM | POA: Diagnosis not present

## 2018-06-20 DIAGNOSIS — M19041 Primary osteoarthritis, right hand: Secondary | ICD-10-CM | POA: Diagnosis not present

## 2018-06-20 DIAGNOSIS — M1611 Unilateral primary osteoarthritis, right hip: Secondary | ICD-10-CM | POA: Diagnosis not present

## 2018-06-20 DIAGNOSIS — R471 Dysarthria and anarthria: Secondary | ICD-10-CM | POA: Diagnosis not present

## 2018-06-20 DIAGNOSIS — M810 Age-related osteoporosis without current pathological fracture: Secondary | ICD-10-CM | POA: Diagnosis not present

## 2018-06-21 DIAGNOSIS — M19041 Primary osteoarthritis, right hand: Secondary | ICD-10-CM | POA: Diagnosis not present

## 2018-06-21 DIAGNOSIS — M1611 Unilateral primary osteoarthritis, right hip: Secondary | ICD-10-CM | POA: Diagnosis not present

## 2018-06-21 DIAGNOSIS — R471 Dysarthria and anarthria: Secondary | ICD-10-CM | POA: Diagnosis not present

## 2018-06-21 DIAGNOSIS — M24541 Contracture, right hand: Secondary | ICD-10-CM | POA: Diagnosis not present

## 2018-06-21 DIAGNOSIS — M810 Age-related osteoporosis without current pathological fracture: Secondary | ICD-10-CM | POA: Diagnosis not present

## 2018-06-21 DIAGNOSIS — M47817 Spondylosis without myelopathy or radiculopathy, lumbosacral region: Secondary | ICD-10-CM | POA: Diagnosis not present

## 2018-06-26 DIAGNOSIS — M1611 Unilateral primary osteoarthritis, right hip: Secondary | ICD-10-CM | POA: Diagnosis not present

## 2018-06-26 DIAGNOSIS — M47817 Spondylosis without myelopathy or radiculopathy, lumbosacral region: Secondary | ICD-10-CM | POA: Diagnosis not present

## 2018-06-26 DIAGNOSIS — M19041 Primary osteoarthritis, right hand: Secondary | ICD-10-CM | POA: Diagnosis not present

## 2018-06-26 DIAGNOSIS — R471 Dysarthria and anarthria: Secondary | ICD-10-CM | POA: Diagnosis not present

## 2018-06-26 DIAGNOSIS — M810 Age-related osteoporosis without current pathological fracture: Secondary | ICD-10-CM | POA: Diagnosis not present

## 2018-06-26 DIAGNOSIS — M24541 Contracture, right hand: Secondary | ICD-10-CM | POA: Diagnosis not present

## 2018-06-28 DIAGNOSIS — M19041 Primary osteoarthritis, right hand: Secondary | ICD-10-CM | POA: Diagnosis not present

## 2018-06-28 DIAGNOSIS — M24541 Contracture, right hand: Secondary | ICD-10-CM | POA: Diagnosis not present

## 2018-06-28 DIAGNOSIS — M47817 Spondylosis without myelopathy or radiculopathy, lumbosacral region: Secondary | ICD-10-CM | POA: Diagnosis not present

## 2018-06-28 DIAGNOSIS — M810 Age-related osteoporosis without current pathological fracture: Secondary | ICD-10-CM | POA: Diagnosis not present

## 2018-06-28 DIAGNOSIS — M1611 Unilateral primary osteoarthritis, right hip: Secondary | ICD-10-CM | POA: Diagnosis not present

## 2018-06-28 DIAGNOSIS — R471 Dysarthria and anarthria: Secondary | ICD-10-CM | POA: Diagnosis not present

## 2018-06-29 DIAGNOSIS — M1611 Unilateral primary osteoarthritis, right hip: Secondary | ICD-10-CM | POA: Diagnosis not present

## 2018-06-29 DIAGNOSIS — M810 Age-related osteoporosis without current pathological fracture: Secondary | ICD-10-CM | POA: Diagnosis not present

## 2018-06-29 DIAGNOSIS — M19041 Primary osteoarthritis, right hand: Secondary | ICD-10-CM | POA: Diagnosis not present

## 2018-06-29 DIAGNOSIS — M47817 Spondylosis without myelopathy or radiculopathy, lumbosacral region: Secondary | ICD-10-CM | POA: Diagnosis not present

## 2018-06-29 DIAGNOSIS — M24541 Contracture, right hand: Secondary | ICD-10-CM | POA: Diagnosis not present

## 2018-06-29 DIAGNOSIS — R471 Dysarthria and anarthria: Secondary | ICD-10-CM | POA: Diagnosis not present

## 2018-07-03 DIAGNOSIS — M47817 Spondylosis without myelopathy or radiculopathy, lumbosacral region: Secondary | ICD-10-CM | POA: Diagnosis not present

## 2018-07-03 DIAGNOSIS — R471 Dysarthria and anarthria: Secondary | ICD-10-CM | POA: Diagnosis not present

## 2018-07-03 DIAGNOSIS — M1611 Unilateral primary osteoarthritis, right hip: Secondary | ICD-10-CM | POA: Diagnosis not present

## 2018-07-03 DIAGNOSIS — M810 Age-related osteoporosis without current pathological fracture: Secondary | ICD-10-CM | POA: Diagnosis not present

## 2018-07-03 DIAGNOSIS — M24541 Contracture, right hand: Secondary | ICD-10-CM | POA: Diagnosis not present

## 2018-07-03 DIAGNOSIS — M19041 Primary osteoarthritis, right hand: Secondary | ICD-10-CM | POA: Diagnosis not present

## 2018-07-06 DIAGNOSIS — M19041 Primary osteoarthritis, right hand: Secondary | ICD-10-CM | POA: Diagnosis not present

## 2018-07-06 DIAGNOSIS — R471 Dysarthria and anarthria: Secondary | ICD-10-CM | POA: Diagnosis not present

## 2018-07-06 DIAGNOSIS — M810 Age-related osteoporosis without current pathological fracture: Secondary | ICD-10-CM | POA: Diagnosis not present

## 2018-07-06 DIAGNOSIS — M47817 Spondylosis without myelopathy or radiculopathy, lumbosacral region: Secondary | ICD-10-CM | POA: Diagnosis not present

## 2018-07-06 DIAGNOSIS — M1611 Unilateral primary osteoarthritis, right hip: Secondary | ICD-10-CM | POA: Diagnosis not present

## 2018-07-06 DIAGNOSIS — M24541 Contracture, right hand: Secondary | ICD-10-CM | POA: Diagnosis not present

## 2018-07-10 DIAGNOSIS — M19041 Primary osteoarthritis, right hand: Secondary | ICD-10-CM | POA: Diagnosis not present

## 2018-07-10 DIAGNOSIS — R471 Dysarthria and anarthria: Secondary | ICD-10-CM | POA: Diagnosis not present

## 2018-07-10 DIAGNOSIS — M47817 Spondylosis without myelopathy or radiculopathy, lumbosacral region: Secondary | ICD-10-CM | POA: Diagnosis not present

## 2018-07-10 DIAGNOSIS — M810 Age-related osteoporosis without current pathological fracture: Secondary | ICD-10-CM | POA: Diagnosis not present

## 2018-07-10 DIAGNOSIS — M1611 Unilateral primary osteoarthritis, right hip: Secondary | ICD-10-CM | POA: Diagnosis not present

## 2018-07-10 DIAGNOSIS — M24541 Contracture, right hand: Secondary | ICD-10-CM | POA: Diagnosis not present

## 2018-07-12 ENCOUNTER — Telehealth: Payer: Self-pay | Admitting: Family Medicine

## 2018-07-12 DIAGNOSIS — M810 Age-related osteoporosis without current pathological fracture: Secondary | ICD-10-CM | POA: Diagnosis not present

## 2018-07-12 DIAGNOSIS — M19041 Primary osteoarthritis, right hand: Secondary | ICD-10-CM | POA: Diagnosis not present

## 2018-07-12 DIAGNOSIS — R471 Dysarthria and anarthria: Secondary | ICD-10-CM | POA: Diagnosis not present

## 2018-07-12 DIAGNOSIS — M24541 Contracture, right hand: Secondary | ICD-10-CM | POA: Diagnosis not present

## 2018-07-12 DIAGNOSIS — M1611 Unilateral primary osteoarthritis, right hip: Secondary | ICD-10-CM | POA: Diagnosis not present

## 2018-07-12 DIAGNOSIS — M47817 Spondylosis without myelopathy or radiculopathy, lumbosacral region: Secondary | ICD-10-CM | POA: Diagnosis not present

## 2018-07-12 NOTE — Telephone Encounter (Signed)
PT Shanon Brow from [facility] is calling to ask for an extension on patients PT orders. He would like to do:  2 times a week for 4 week Strengthening  Balance Transfers [place]

## 2018-07-12 NOTE — Telephone Encounter (Signed)
Please call in these orders  Thanks  [facility]  

## 2018-07-12 NOTE — Telephone Encounter (Signed)
David from [facility] informed of verbal orders. Caesar Mannella Kennon Holter, CMA

## 2018-07-12 NOTE — Telephone Encounter (Signed)
David's CB number is [phone].

## 2018-07-17 DIAGNOSIS — M810 Age-related osteoporosis without current pathological fracture: Secondary | ICD-10-CM | POA: Diagnosis not present

## 2018-07-17 DIAGNOSIS — R471 Dysarthria and anarthria: Secondary | ICD-10-CM | POA: Diagnosis not present

## 2018-07-17 DIAGNOSIS — M19041 Primary osteoarthritis, right hand: Secondary | ICD-10-CM | POA: Diagnosis not present

## 2018-07-17 DIAGNOSIS — M24541 Contracture, right hand: Secondary | ICD-10-CM | POA: Diagnosis not present

## 2018-07-17 DIAGNOSIS — M1611 Unilateral primary osteoarthritis, right hip: Secondary | ICD-10-CM | POA: Diagnosis not present

## 2018-07-17 DIAGNOSIS — M47817 Spondylosis without myelopathy or radiculopathy, lumbosacral region: Secondary | ICD-10-CM | POA: Diagnosis not present

## 2018-07-20 DIAGNOSIS — M810 Age-related osteoporosis without current pathological fracture: Secondary | ICD-10-CM | POA: Diagnosis not present

## 2018-07-20 DIAGNOSIS — M19041 Primary osteoarthritis, right hand: Secondary | ICD-10-CM | POA: Diagnosis not present

## 2018-07-20 DIAGNOSIS — M47817 Spondylosis without myelopathy or radiculopathy, lumbosacral region: Secondary | ICD-10-CM | POA: Diagnosis not present

## 2018-07-20 DIAGNOSIS — M1611 Unilateral primary osteoarthritis, right hip: Secondary | ICD-10-CM | POA: Diagnosis not present

## 2018-07-20 DIAGNOSIS — R471 Dysarthria and anarthria: Secondary | ICD-10-CM | POA: Diagnosis not present

## 2018-07-20 DIAGNOSIS — M24541 Contracture, right hand: Secondary | ICD-10-CM | POA: Diagnosis not present

## 2018-07-23 DIAGNOSIS — R471 Dysarthria and anarthria: Secondary | ICD-10-CM | POA: Diagnosis not present

## 2018-07-23 DIAGNOSIS — M1611 Unilateral primary osteoarthritis, right hip: Secondary | ICD-10-CM | POA: Diagnosis not present

## 2018-07-23 DIAGNOSIS — M19041 Primary osteoarthritis, right hand: Secondary | ICD-10-CM | POA: Diagnosis not present

## 2018-07-23 DIAGNOSIS — M24541 Contracture, right hand: Secondary | ICD-10-CM | POA: Diagnosis not present

## 2018-07-23 DIAGNOSIS — M47817 Spondylosis without myelopathy or radiculopathy, lumbosacral region: Secondary | ICD-10-CM | POA: Diagnosis not present

## 2018-07-23 DIAGNOSIS — M810 Age-related osteoporosis without current pathological fracture: Secondary | ICD-10-CM | POA: Diagnosis not present

## 2018-07-25 DIAGNOSIS — R471 Dysarthria and anarthria: Secondary | ICD-10-CM | POA: Diagnosis not present

## 2018-07-25 DIAGNOSIS — M19041 Primary osteoarthritis, right hand: Secondary | ICD-10-CM | POA: Diagnosis not present

## 2018-07-25 DIAGNOSIS — M47817 Spondylosis without myelopathy or radiculopathy, lumbosacral region: Secondary | ICD-10-CM | POA: Diagnosis not present

## 2018-07-25 DIAGNOSIS — M24541 Contracture, right hand: Secondary | ICD-10-CM | POA: Diagnosis not present

## 2018-07-25 DIAGNOSIS — M810 Age-related osteoporosis without current pathological fracture: Secondary | ICD-10-CM | POA: Diagnosis not present

## 2018-07-25 DIAGNOSIS — M1611 Unilateral primary osteoarthritis, right hip: Secondary | ICD-10-CM | POA: Diagnosis not present

## 2018-07-30 DIAGNOSIS — M47817 Spondylosis without myelopathy or radiculopathy, lumbosacral region: Secondary | ICD-10-CM | POA: Diagnosis not present

## 2018-07-30 DIAGNOSIS — M24541 Contracture, right hand: Secondary | ICD-10-CM | POA: Diagnosis not present

## 2018-07-30 DIAGNOSIS — M1611 Unilateral primary osteoarthritis, right hip: Secondary | ICD-10-CM | POA: Diagnosis not present

## 2018-07-30 DIAGNOSIS — R471 Dysarthria and anarthria: Secondary | ICD-10-CM | POA: Diagnosis not present

## 2018-07-30 DIAGNOSIS — M810 Age-related osteoporosis without current pathological fracture: Secondary | ICD-10-CM | POA: Diagnosis not present

## 2018-07-30 DIAGNOSIS — M19041 Primary osteoarthritis, right hand: Secondary | ICD-10-CM | POA: Diagnosis not present

## 2018-08-01 DIAGNOSIS — M24541 Contracture, right hand: Secondary | ICD-10-CM | POA: Diagnosis not present

## 2018-08-01 DIAGNOSIS — M810 Age-related osteoporosis without current pathological fracture: Secondary | ICD-10-CM | POA: Diagnosis not present

## 2018-08-01 DIAGNOSIS — M47817 Spondylosis without myelopathy or radiculopathy, lumbosacral region: Secondary | ICD-10-CM | POA: Diagnosis not present

## 2018-08-01 DIAGNOSIS — R471 Dysarthria and anarthria: Secondary | ICD-10-CM | POA: Diagnosis not present

## 2018-08-01 DIAGNOSIS — M1611 Unilateral primary osteoarthritis, right hip: Secondary | ICD-10-CM | POA: Diagnosis not present

## 2018-08-01 DIAGNOSIS — M19041 Primary osteoarthritis, right hand: Secondary | ICD-10-CM | POA: Diagnosis not present

## 2018-08-04 ENCOUNTER — Telehealth: Payer: Self-pay | Admitting: Family Medicine

## 2018-08-04 NOTE — Telephone Encounter (Signed)
**   After Hours Emergency Line **   Patient states since 6 or 7 PM she has had extreme pain in her joints, specifically her hips, knees and back.  Pain feels like electrical shocks.  No history of falls or trauma.  She states about 7 years ago she had similar pain and was told she has an "acute arthritis attack."  She took a hydrocodone 5-325 at 8 pm and 11 pm, in addition to diclofenac at 9 pm but has not had much relief in symptoms.  No cp, sob, leg swelling, palpitations.   No numbness, tingling or weakness. Is able to move her extremities.  She is alert and oriented x3.   Advised ice and heating pad in addition to taking another hydrocodone around 2AM.  Will likely need to be evaluated in person and patient understands this.  She was offered to come into the ED vs wait until Monday to be seen in clinic.  She states she will call on Monday to come to clinic but will come into the ED to be evaluated sooner if symptoms worsen.  Red flags reviewed.   Will route note to PCP.   Lovenia Kim MD  [facility] PGY3

## 2018-08-06 NOTE — Telephone Encounter (Signed)
She is feeling back to normal now  Told to come in if recurrs or other symptoms She agrees

## 2018-08-07 ENCOUNTER — Encounter: Payer: Self-pay | Admitting: Family Medicine

## 2018-08-07 ENCOUNTER — Other Ambulatory Visit: Payer: Self-pay | Admitting: *Deleted

## 2018-08-07 DIAGNOSIS — M24541 Contracture, right hand: Secondary | ICD-10-CM | POA: Diagnosis not present

## 2018-08-07 DIAGNOSIS — M1611 Unilateral primary osteoarthritis, right hip: Secondary | ICD-10-CM | POA: Diagnosis not present

## 2018-08-07 DIAGNOSIS — M47816 Spondylosis without myelopathy or radiculopathy, lumbar region: Secondary | ICD-10-CM

## 2018-08-07 DIAGNOSIS — M19041 Primary osteoarthritis, right hand: Secondary | ICD-10-CM | POA: Diagnosis not present

## 2018-08-07 DIAGNOSIS — M810 Age-related osteoporosis without current pathological fracture: Secondary | ICD-10-CM | POA: Diagnosis not present

## 2018-08-07 DIAGNOSIS — M79641 Pain in right hand: Secondary | ICD-10-CM

## 2018-08-07 DIAGNOSIS — M47817 Spondylosis without myelopathy or radiculopathy, lumbosacral region: Secondary | ICD-10-CM | POA: Diagnosis not present

## 2018-08-07 DIAGNOSIS — R471 Dysarthria and anarthria: Secondary | ICD-10-CM | POA: Diagnosis not present

## 2018-08-07 DIAGNOSIS — M159 Polyosteoarthritis, unspecified: Secondary | ICD-10-CM

## 2018-08-08 DIAGNOSIS — M1611 Unilateral primary osteoarthritis, right hip: Secondary | ICD-10-CM | POA: Diagnosis not present

## 2018-08-08 DIAGNOSIS — M24541 Contracture, right hand: Secondary | ICD-10-CM | POA: Diagnosis not present

## 2018-08-08 DIAGNOSIS — R471 Dysarthria and anarthria: Secondary | ICD-10-CM | POA: Diagnosis not present

## 2018-08-08 DIAGNOSIS — M47817 Spondylosis without myelopathy or radiculopathy, lumbosacral region: Secondary | ICD-10-CM | POA: Diagnosis not present

## 2018-08-08 DIAGNOSIS — M810 Age-related osteoporosis without current pathological fracture: Secondary | ICD-10-CM | POA: Diagnosis not present

## 2018-08-08 DIAGNOSIS — M19041 Primary osteoarthritis, right hand: Secondary | ICD-10-CM | POA: Diagnosis not present

## 2018-08-08 MED ORDER — HYDROCODONE-ACETAMINOPHEN 5-325 MG PO TABS: 1.0000 | ORAL_TABLET | Freq: Three times a day (TID) | ORAL | 0 refills | Status: DC | PRN

## 2018-08-09 ENCOUNTER — Telehealth: Payer: Self-pay | Admitting: Family Medicine

## 2018-08-09 NOTE — Telephone Encounter (Signed)
Called her  She is ok for a house call today

## 2018-08-16 ENCOUNTER — Encounter: Payer: Self-pay | Admitting: Family Medicine

## 2018-08-20 ENCOUNTER — Telehealth: Payer: Self-pay | Admitting: Neurology

## 2018-08-20 NOTE — Telephone Encounter (Signed)
Pt and niece Rod Holler called stating she has stopped taking medication carbidopa-levodopa (SINEMET IR) 25-100 MG tablet Saturday 10/12 due to s/e as blurry vision. Requesting a call to know if Dr. Jaynee Eagles would like to replace medication

## 2018-08-20 NOTE — Telephone Encounter (Addendum)
Called patient. She stated that early last week she developed blurred vision. She saw eye doctor and was told that it was "not a floater or anything like that", could be cataracts, not sure. She stated that she stopped the Sinemet on Saturday and the blurred vision cleared up after. She started the Sinemet in August roughly. RN advised that she would talk to Dr. Jaynee Eagles and call her back. Pt verbalized appreciation.

## 2018-08-20 NOTE — Telephone Encounter (Signed)
Pts niece Rod Holler requesting RN call [place] to discuss

## 2018-08-20 NOTE — Telephone Encounter (Signed)
Called pt and Rod Holler on both of their phones and LVM asking for call back. Left office number in both messages.

## 2018-08-21 NOTE — Telephone Encounter (Signed)
I would try the Sinemet again maybe twice daily instead of 3x daily thanks. I'm not aware that Sinemet causes blurry vision but if it happens again let us know thanks

## 2018-08-21 NOTE — Telephone Encounter (Signed)
Spoke with patient. Advised her that Dr. Jaynee Eagles recommends patient try the Sinemet again, maybe twice instead of three times per day. Review instructions with patient, take 1/2 tablet twice daily ex. Morning and evening. Separate from protein-rich foods by 30-60 minutes. RN advised that Dr. Jaynee Eagles is not aware of Sinemet causing blurred vision. If this happens again let us know. Patient verbalized understanding and had no questions.

## 2018-08-23 ENCOUNTER — Telehealth: Payer: Self-pay | Admitting: Family Medicine

## 2018-08-23 NOTE — Telephone Encounter (Signed)
Called gave condolences about Neal's passing.  She feels he is much better off and does not need anything. I urged her to contact me if anything comes up

## 2018-09-17 ENCOUNTER — Other Ambulatory Visit: Payer: Self-pay | Admitting: Family Medicine

## 2018-09-17 DIAGNOSIS — M79641 Pain in right hand: Secondary | ICD-10-CM

## 2018-09-17 DIAGNOSIS — M47816 Spondylosis without myelopathy or radiculopathy, lumbar region: Secondary | ICD-10-CM

## 2018-09-17 DIAGNOSIS — M159 Polyosteoarthritis, unspecified: Secondary | ICD-10-CM

## 2018-09-17 NOTE — Telephone Encounter (Signed)
Pt needs refill on her hydrocodone. Please let her know once this has been called in.

## 2018-09-18 NOTE — Telephone Encounter (Signed)
Helen Odom is calling to check on the status of the pt's hydrocodone being refilled. Pt is completely out. Please contact patient when this has been sent to the pharmacy.

## 2018-09-19 ENCOUNTER — Encounter: Payer: Self-pay | Admitting: Family Medicine

## 2018-09-19 MED ORDER — HYDROCODONE-ACETAMINOPHEN 5-325 MG PO TABS: 1.0000 | ORAL_TABLET | Freq: Three times a day (TID) | ORAL | 0 refills | Status: DC | PRN

## 2018-09-19 NOTE — Telephone Encounter (Signed)
I just filled it  Please let them know  Thanks  [place]

## 2018-10-01 ENCOUNTER — Telehealth: Payer: Self-pay | Admitting: Family Medicine

## 2018-10-01 MED ORDER — TIZANIDINE HCL 2 MG PO CAPS: 2.0000 mg | ORAL_CAPSULE | Freq: Three times a day (TID) | ORAL | 0 refills | Status: DC | PRN

## 2018-10-01 NOTE — Telephone Encounter (Signed)
Having pain all over.  Hydrocodone and diclofenac not helping.  Feels stiff  No fever or nausea and vomiting  Will call in muscle relaxer low dose to try.  If worsens then should go to ER She has appointment to see me tomorrow

## 2018-10-02 ENCOUNTER — Ambulatory Visit (INDEPENDENT_AMBULATORY_CARE_PROVIDER_SITE_OTHER): Payer: Medicare Other | Admitting: Family Medicine

## 2018-10-02 ENCOUNTER — Encounter: Payer: Self-pay | Admitting: Family Medicine

## 2018-10-02 ENCOUNTER — Other Ambulatory Visit: Payer: Self-pay

## 2018-10-02 ENCOUNTER — Ambulatory Visit (INDEPENDENT_AMBULATORY_CARE_PROVIDER_SITE_OTHER): Payer: Medicare Other | Admitting: Licensed Clinical Social Worker

## 2018-10-02 DIAGNOSIS — F4329 Adjustment disorder with other symptoms: Secondary | ICD-10-CM | POA: Diagnosis not present

## 2018-10-02 DIAGNOSIS — G20A1 Parkinson's disease without dyskinesia, without mention of fluctuations: Secondary | ICD-10-CM

## 2018-10-02 DIAGNOSIS — M79641 Pain in right hand: Secondary | ICD-10-CM | POA: Diagnosis not present

## 2018-10-02 DIAGNOSIS — F3289 Other specified depressive episodes: Secondary | ICD-10-CM | POA: Diagnosis not present

## 2018-10-02 DIAGNOSIS — M159 Polyosteoarthritis, unspecified: Secondary | ICD-10-CM

## 2018-10-02 DIAGNOSIS — M47816 Spondylosis without myelopathy or radiculopathy, lumbar region: Secondary | ICD-10-CM | POA: Diagnosis not present

## 2018-10-02 DIAGNOSIS — G20 Parkinson's disease: Secondary | ICD-10-CM

## 2018-10-02 MED ORDER — HYDROCODONE-ACETAMINOPHEN 5-325 MG PO TABS: 1.0000 | ORAL_TABLET | Freq: Four times a day (QID) | ORAL | 0 refills | Status: DC | PRN

## 2018-10-02 MED ORDER — TIZANIDINE HCL 2 MG PO CAPS: 2.0000 mg | ORAL_CAPSULE | Freq: Three times a day (TID) | ORAL | 1 refills | Status: DC | PRN

## 2018-10-02 NOTE — BH Specialist Note (Signed)
Integrated Behavioral Health Initial Visit  MRN: [id number] Name: Helen Odom  Session Start time: 11:00  Session End time: 11:30 Total time: 30 minutes Type of Service: Integrated Behavioral Health  Warm Hand Off Completed.     Patient verbally consented to meet with [facility] Consultant about presenting concerns. SUBJECTIVE: Helen Odom is a 84 y.o. female referred by Dr. Brooke Dare for assistance with adjustment reaction to parkinson. Patient is accompanied by her aide Suanne Marker. Report of symptoms: feeling down and helpless with progression of parkinson disease.  ASSESSMENT: Mood: Depressed and Affect: Depressed Patient is currently experiencing symptoms of  depression exacerbated by recent death of her husband and adjustment to progression of her parkinson diease. Patient is tearful today, states quality of life is important to patient.    GOALS: Patient will: 1. Spend quality time with family during Holidays 2. Increase knowledge of parkinson 3. Demonstrate ability to: Increase healthy adjustment to current life circumstances  4. Prepare for appointment with Dr. Loni Muse by writing down question and share importance of quality of life. PLAN:Follow up with behavioral health clinician: as needed INTERVENTION:  Supportive Counseling,  Psychoeducation and emotional support.  Casimer Lanius, [facility]   [phone] 1:49 PM

## 2018-10-02 NOTE — Progress Notes (Signed)
Subjective  Helen Odom is a 84 y.o. female is presenting with the following  PAIN Has diffuse pain in joints with occasional severe episodes when she feels is cramping all over.  No specific joint swelling or redness.  No fever no recent trauma.  Taking hydrocodcone regularly as well as diclofenac and tylenol   DEPRESSION Feeling down with recent death of husband (expected) and poor prognosis of Parkinsons.  Taking sertraline regularly. No suicidal ideation but not sure life is worth living   Chief Complaint noted Review of Symptoms - see HPI PMH - Smoking status noted.    Objective Vital Signs reviewed BP (!) 160/90   Pulse 74   Temp 98.6 F (37 C) (Oral)   LMP 10/14/1988   SpO2 95%  Frequently tearful Masked facies unable to walk Contracted hands R>L  Assessments/Plans  See after visit summary for details of patient instuctions  Depression Worsening but hard to determine how much is pain vs situation vs Parkinsonism.  Will work to improve her pain.   DJD (degenerative joint disease), multiple sites Has chronic baseline pain but also severe episodes that may be related to muscle spasms and Parkinsons.  Increase hydrocodone - see after visit summary.  Suggested they discuss with their neurologist as to possible approaches to pain and prognosis discussions

## 2018-10-02 NOTE — Patient Instructions (Addendum)
Good to see you today!  Thanks for coming in.  Take hydrocodone every 6 hours as needed  You should take the diclofenac every 12 houts every day and can take with the hydrocodone  Take one 325 mg tylenol every 8 hours a day every day  Use the Zanaflex as needed up to three times a day  See Dr Jaynee Eagles for other ideas for parkinson's pain  Call or contact me best through My chart  But page if an emergency

## 2018-10-03 ENCOUNTER — Encounter: Payer: Self-pay | Admitting: Family Medicine

## 2018-10-03 NOTE — Assessment & Plan Note (Signed)
Worsening but hard to determine how much is pain vs situation vs Parkinsonism.  Will work to improve her pain.

## 2018-10-03 NOTE — Assessment & Plan Note (Addendum)
Has chronic baseline pain but also severe episodes that may be related to muscle spasms and Parkinsons.  Increase hydrocodone - see after visit summary.  Suggested they discuss with their neurologist as to possible approaches to pain and prognosis discussions.  Physical Therapy seem to help so will order again

## 2018-10-10 ENCOUNTER — Encounter: Payer: Self-pay | Admitting: Family Medicine

## 2018-10-21 ENCOUNTER — Encounter: Payer: Self-pay | Admitting: Family Medicine

## 2018-10-21 DIAGNOSIS — G20 Parkinson's disease: Secondary | ICD-10-CM

## 2018-10-21 IMAGING — CR DG HIP (WITH OR WITHOUT PELVIS) 2-3V*R*
3 series · 3 of 3 positions shown · non-contrast
Comparison: None.

CLINICAL DATA: 84 y/o  F; status post fall with right hip pain.

EXAM:
DG HIP (WITH OR WITHOUT PELVIS) 2-3V RIGHT

[pelvis ap]
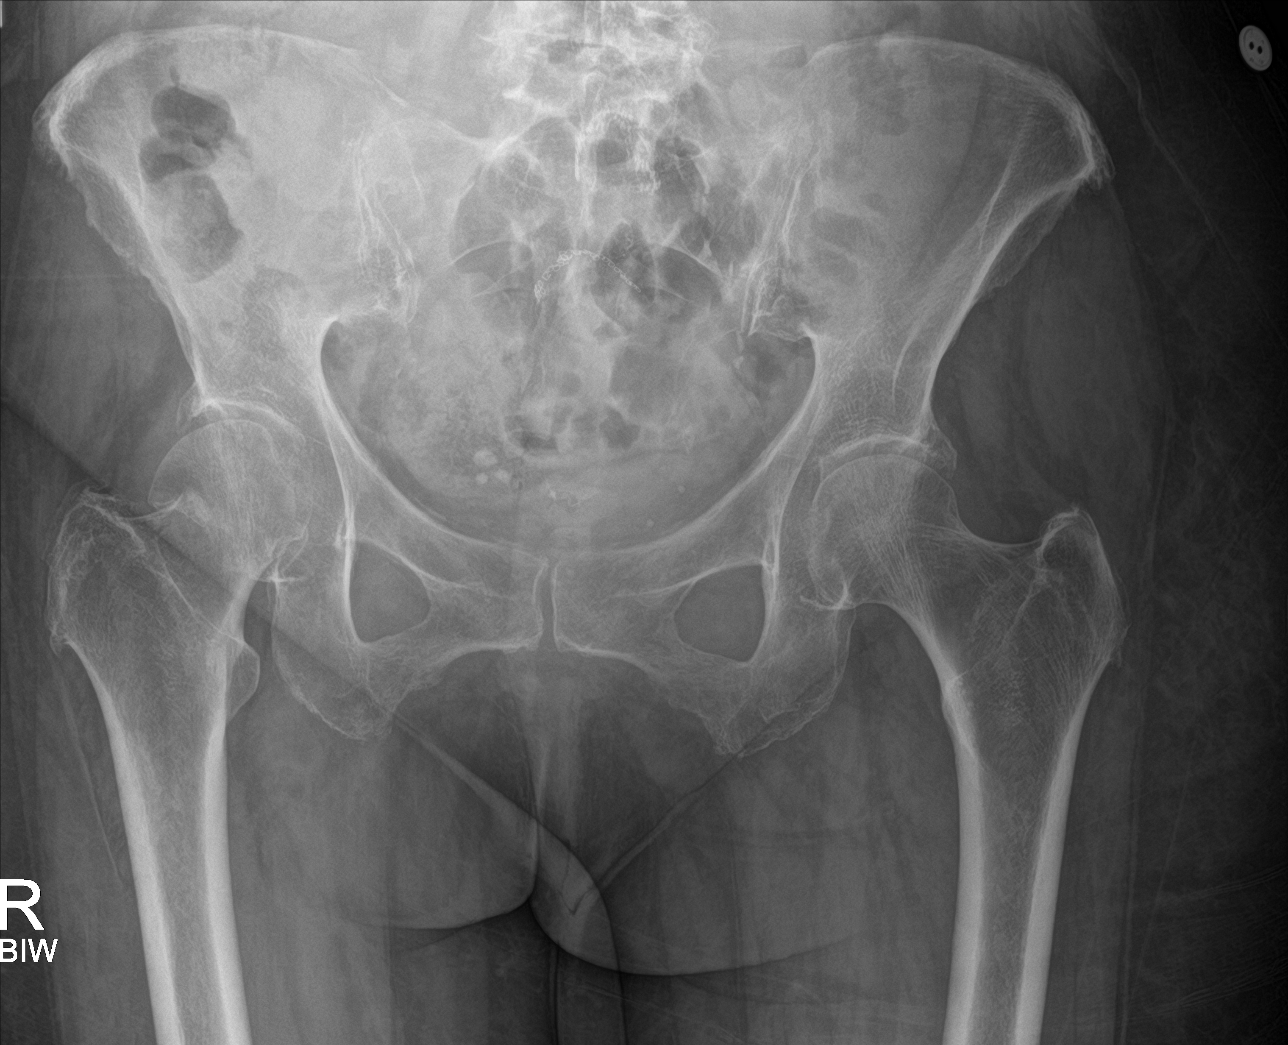

[hip ap]
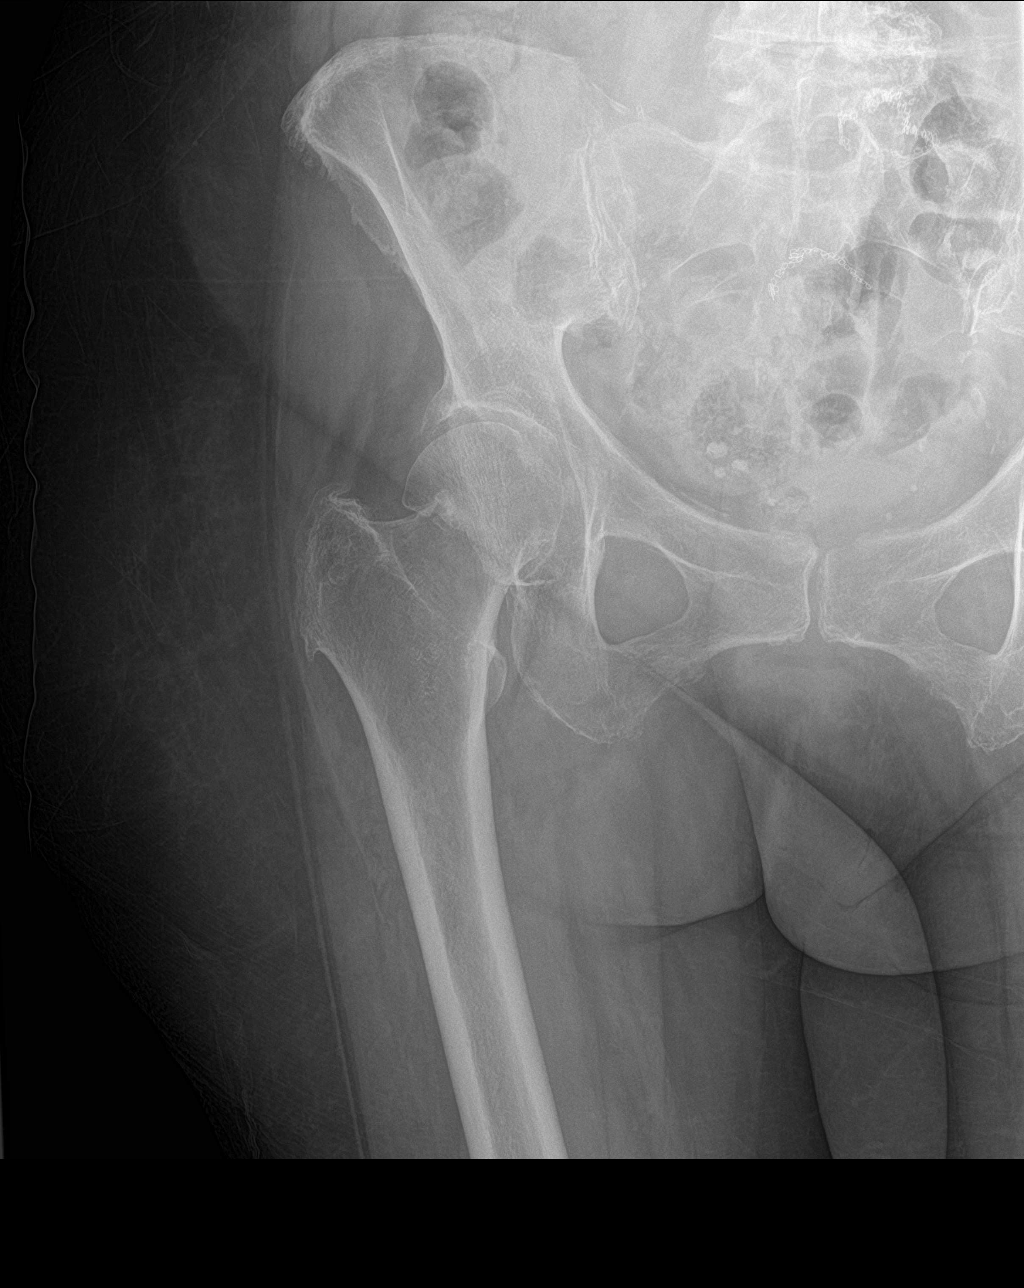

[hip lat]
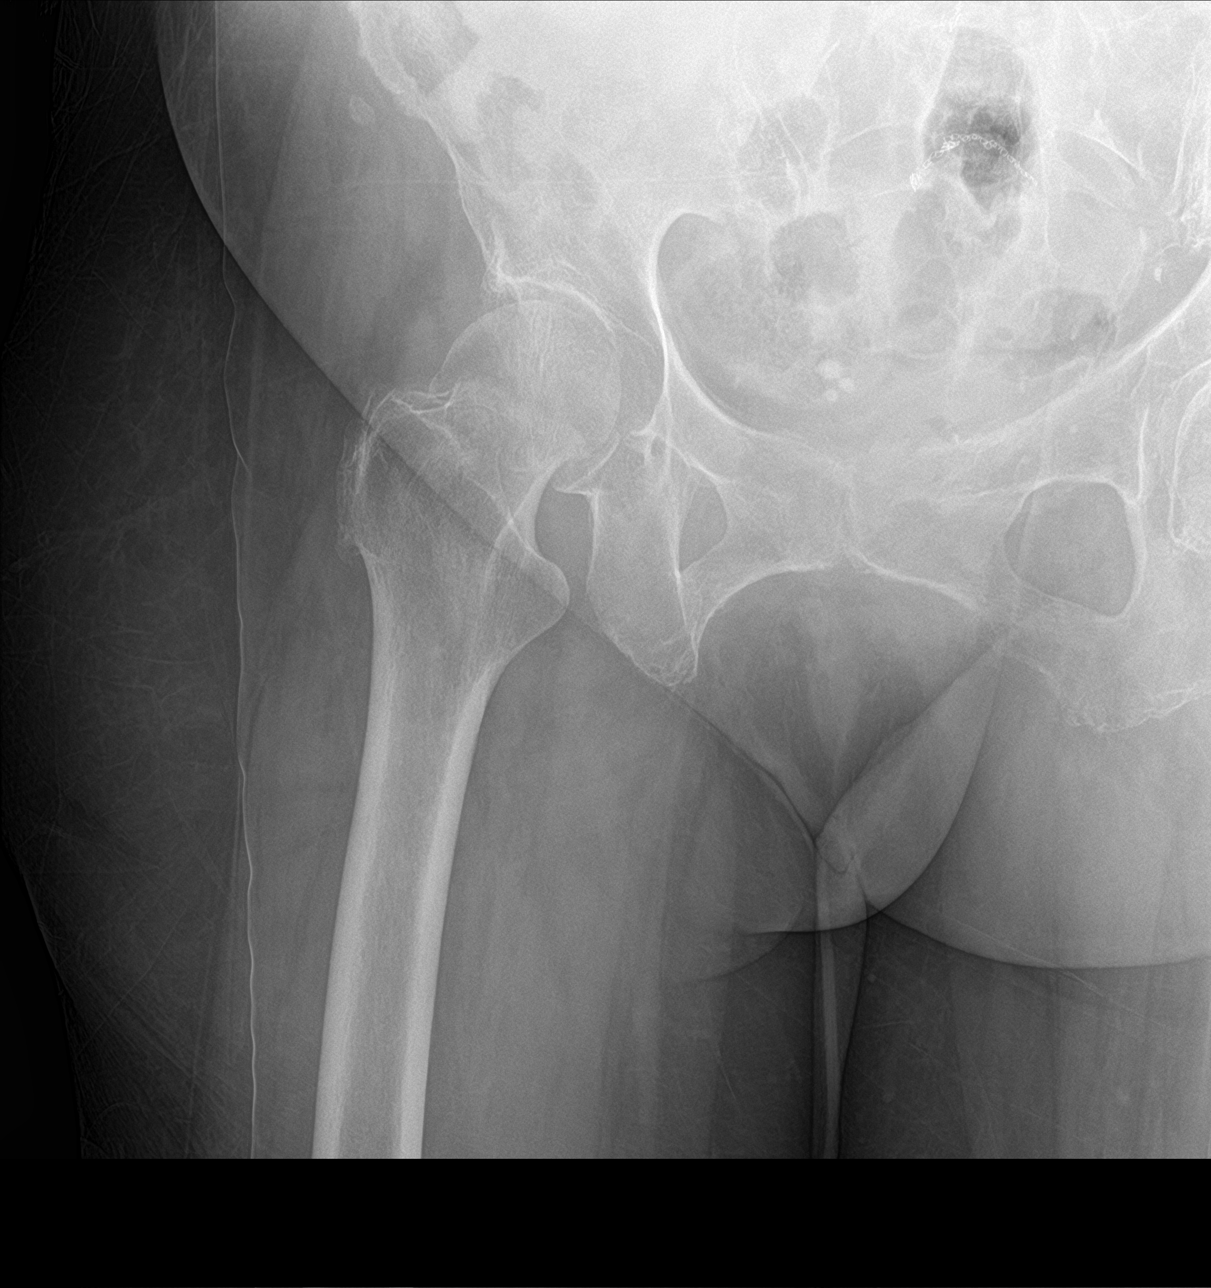

[3 of 3 positions shown; findings below may reference images not displayed]

FINDINGS: Impacted right-sided femoral neck fracture. No other fracture
identified. Mild degenerative changes of bilateral sacroiliac
joints. Lower lumbar arthrosis. Surgical sutures project over the
mid pelvis.
IMPRESSION: Impacted right-sided femoral neck fracture.

By: Rantona Bhebhe M.D.

## 2018-10-22 IMAGING — CR DG CHEST 2V
2 series · 2 of 2 positions shown · non-contrast
Comparison: Chest x-ray of 10/11/2016

CLINICAL DATA: Recent fall with fractured hip, some shortness of
breath, history of carcinoma of the colon

EXAM:
CHEST  2 VIEW

[chest lat]
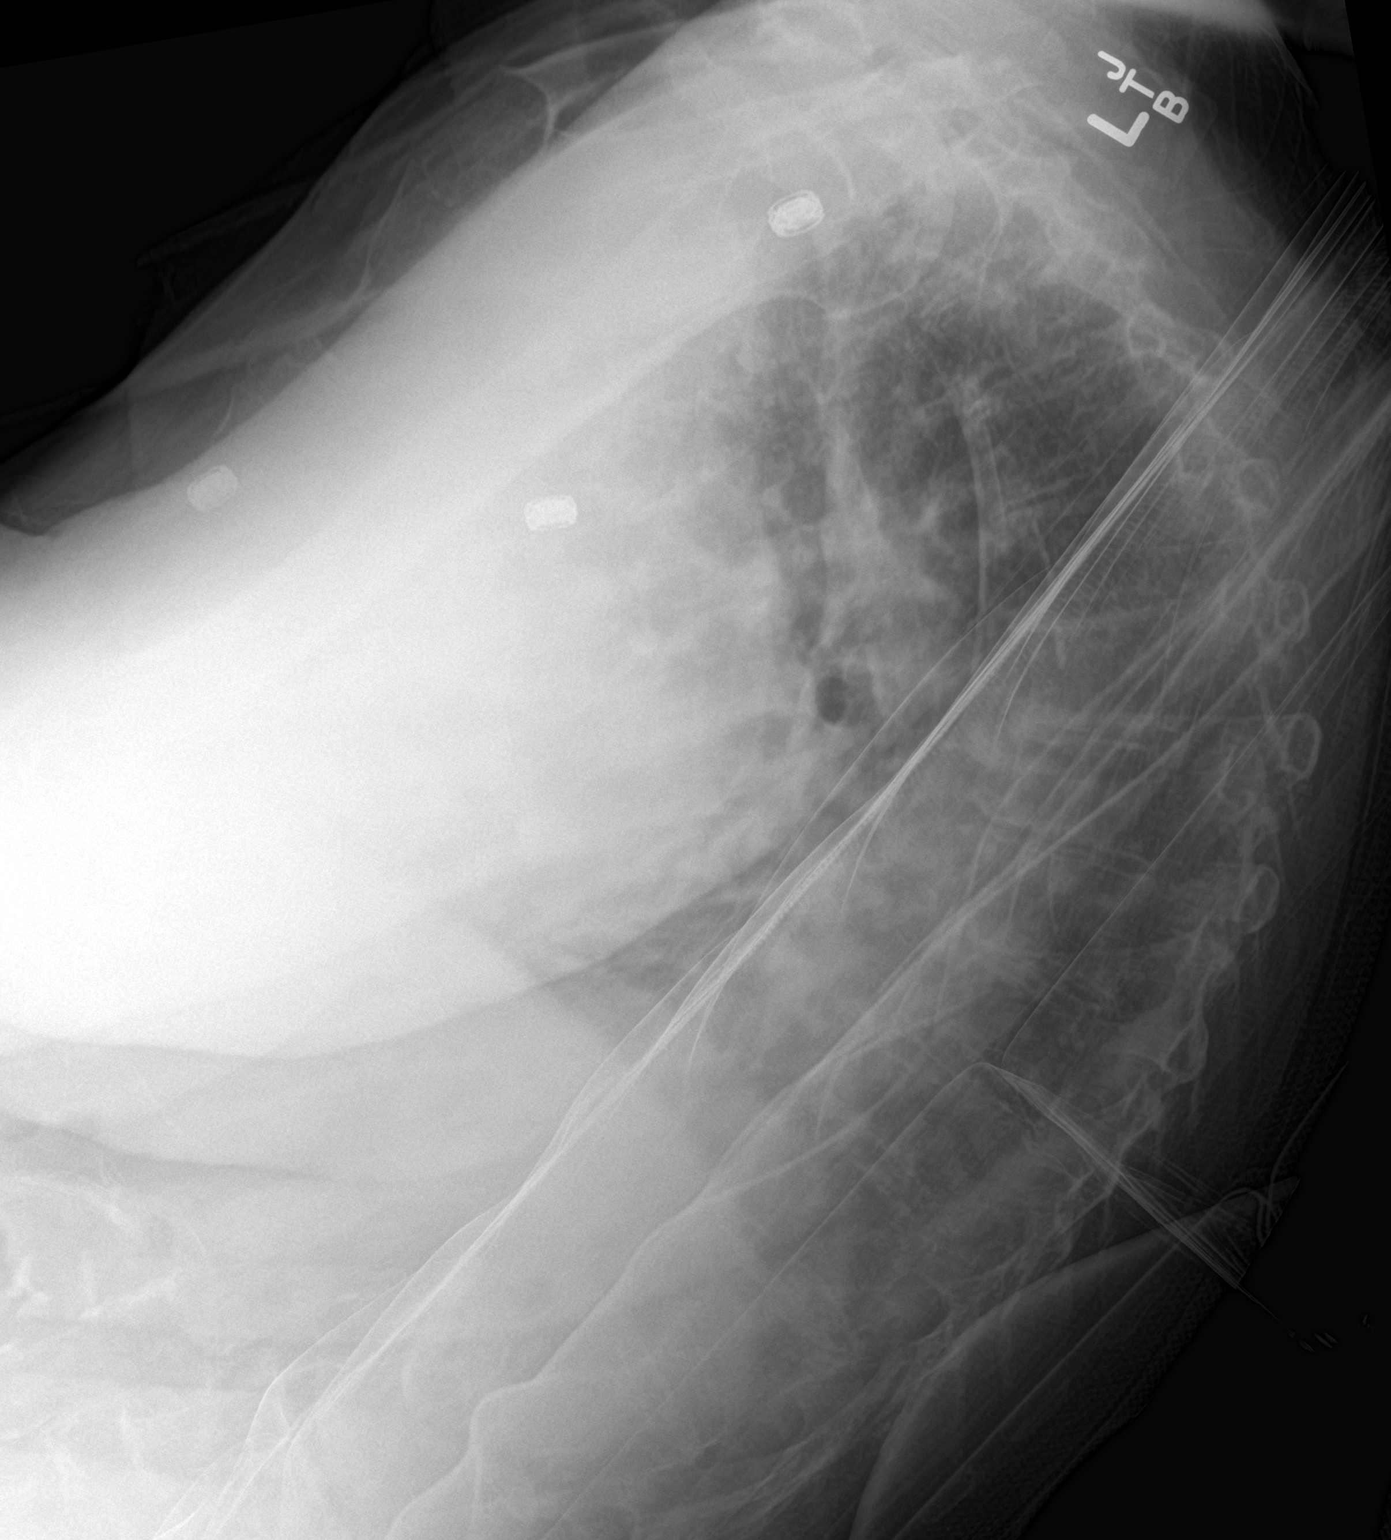

[chest ap]
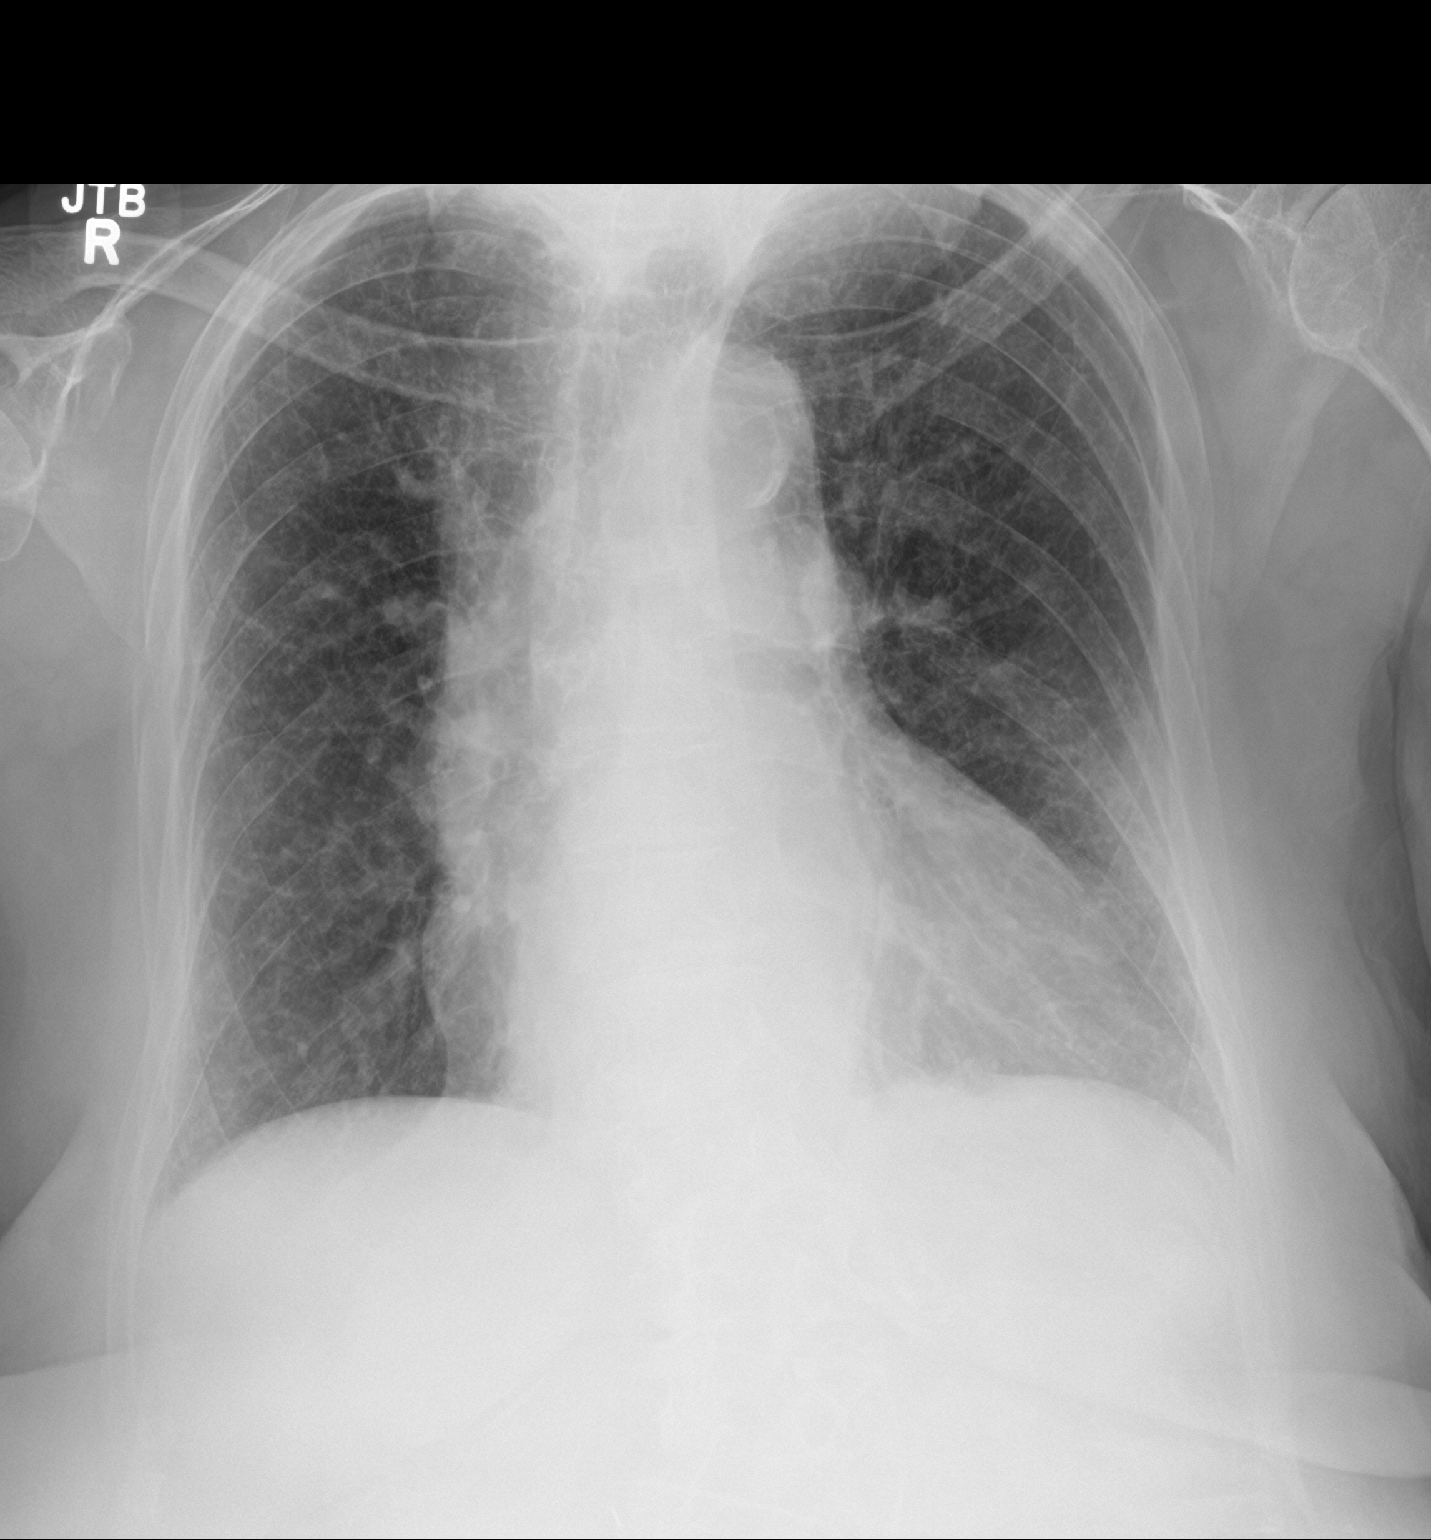

[2 of 2 positions shown; findings below may reference images not displayed]

FINDINGS: The lateral view is suboptimal in in this patient with a recently
fractured hip who could not be positioned adequately. No infiltrate
or effusion is seen. Somewhat prominent interstitial markings most
likely are chronic in nature. Cardiomegaly is stable. No acute bony
abnormality is seen.
IMPRESSION: 1. No active infiltrate or effusion.
2. Cardiomegaly.
3. Somewhat prominent interstitial markings most likely chronic.

## 2018-10-23 IMAGING — RF DG HIP (WITH PELVIS) OPERATIVE*R*
1 series · 2 of 2 positions shown · non-contrast
Comparison: 10/11/2016

CLINICAL DATA: Right hip pinning for right hip fracture.

EXAM:
OPERATIVE RIGHT HIP (WITH PELVIS IF PERFORMED) 2 VIEWS
TECHNIQUE: Fluoroscopic spot image(s) were submitted for interpretation
post-operatively.

[Series 1: run · 2 of 2 slices shown]
[im 1/2]
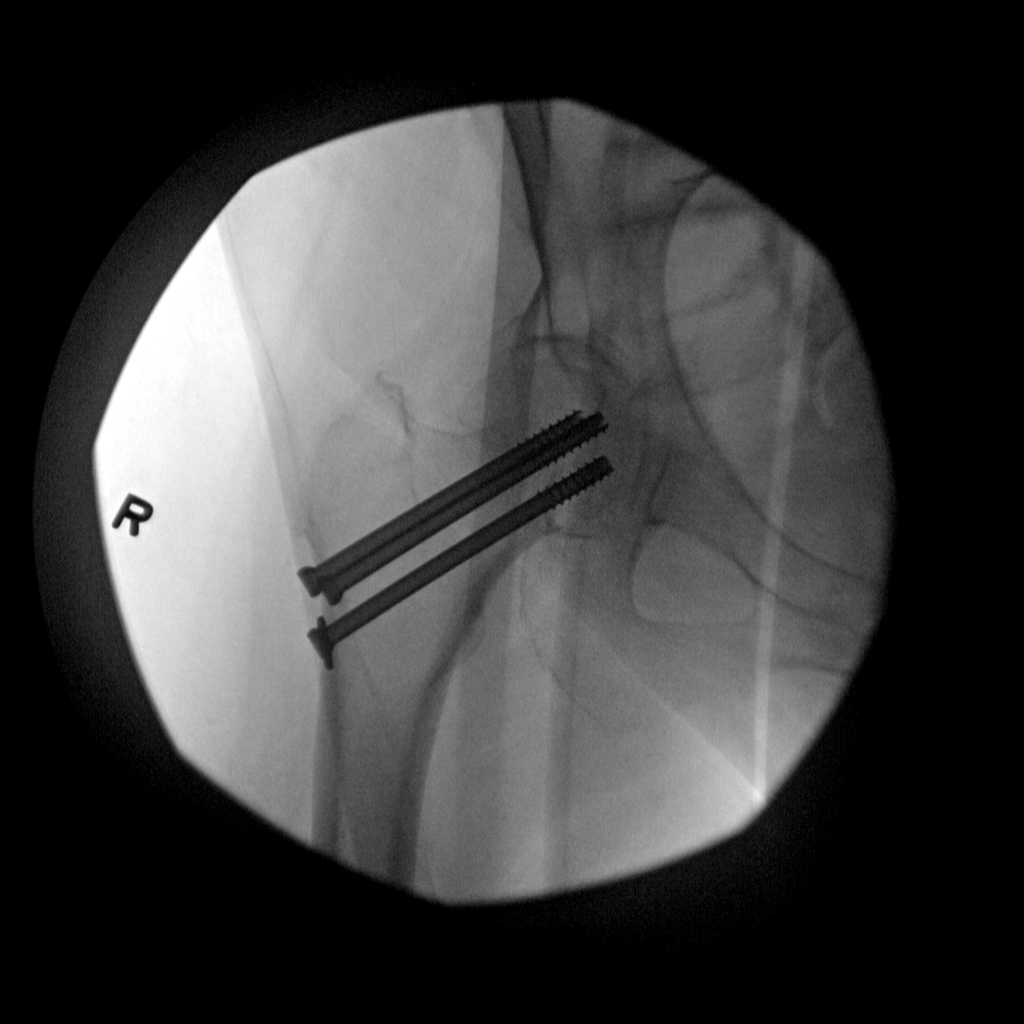
[im 2/2]
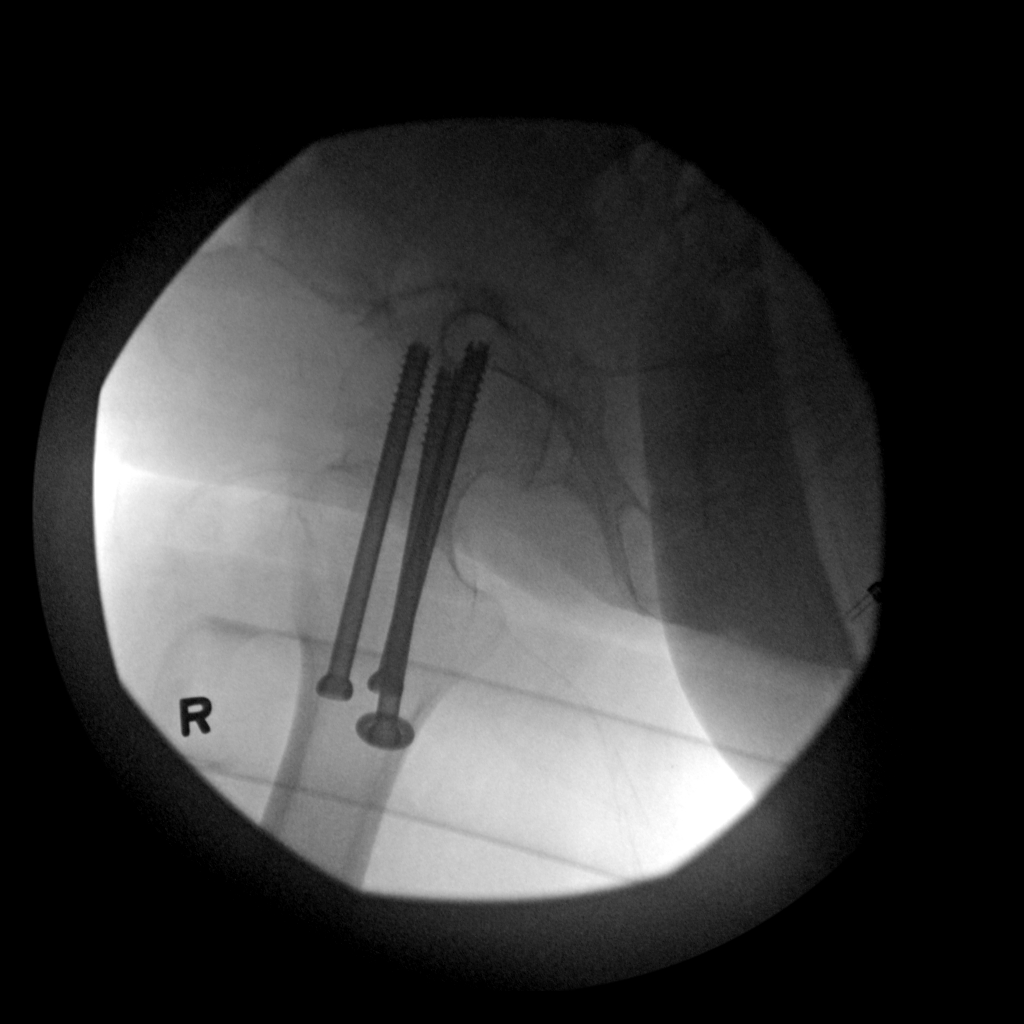

[2 of 2 positions shown; findings below may reference images not displayed]

FINDINGS: Three screws are noted within the right femoral neck across the
right femoral neck fracture. No complicating feature. Anatomic
alignment.
IMPRESSION: Internal fixation of the right femoral neck fracture. No visible
complicating feature.

## 2018-11-08 ENCOUNTER — Other Ambulatory Visit: Payer: Self-pay

## 2018-11-08 DIAGNOSIS — M47816 Spondylosis without myelopathy or radiculopathy, lumbar region: Secondary | ICD-10-CM

## 2018-11-08 DIAGNOSIS — M79641 Pain in right hand: Secondary | ICD-10-CM

## 2018-11-08 DIAGNOSIS — M159 Polyosteoarthritis, unspecified: Secondary | ICD-10-CM

## 2018-11-09 ENCOUNTER — Other Ambulatory Visit: Payer: Self-pay | Admitting: Family Medicine

## 2018-11-09 ENCOUNTER — Other Ambulatory Visit: Payer: Self-pay

## 2018-11-09 DIAGNOSIS — M159 Polyosteoarthritis, unspecified: Secondary | ICD-10-CM

## 2018-11-09 DIAGNOSIS — M79641 Pain in right hand: Secondary | ICD-10-CM

## 2018-11-09 DIAGNOSIS — M47816 Spondylosis without myelopathy or radiculopathy, lumbar region: Secondary | ICD-10-CM

## 2018-11-09 MED ORDER — HYDROCODONE-ACETAMINOPHEN 5-325 MG PO TABS: 1.0000 | ORAL_TABLET | Freq: Four times a day (QID) | ORAL | 0 refills | Status: DC | PRN

## 2018-11-09 NOTE — Telephone Encounter (Signed)
Please re-send this prescription. When sent by PCP it was set to "print". Danley Danker, RN [facility] RN)

## 2018-11-12 ENCOUNTER — Telehealth: Payer: Self-pay | Admitting: Family Medicine

## 2018-11-12 DIAGNOSIS — M24541 Contracture, right hand: Secondary | ICD-10-CM | POA: Diagnosis not present

## 2018-11-12 DIAGNOSIS — R1312 Dysphagia, oropharyngeal phase: Secondary | ICD-10-CM | POA: Diagnosis not present

## 2018-11-12 DIAGNOSIS — Z79891 Long term (current) use of opiate analgesic: Secondary | ICD-10-CM | POA: Diagnosis not present

## 2018-11-12 DIAGNOSIS — Z9181 History of falling: Secondary | ICD-10-CM | POA: Diagnosis not present

## 2018-11-12 DIAGNOSIS — M810 Age-related osteoporosis without current pathological fracture: Secondary | ICD-10-CM | POA: Diagnosis not present

## 2018-11-12 DIAGNOSIS — M159 Polyosteoarthritis, unspecified: Secondary | ICD-10-CM | POA: Diagnosis not present

## 2018-11-12 DIAGNOSIS — M6281 Muscle weakness (generalized): Secondary | ICD-10-CM | POA: Diagnosis not present

## 2018-11-12 DIAGNOSIS — R471 Dysarthria and anarthria: Secondary | ICD-10-CM | POA: Diagnosis not present

## 2018-11-12 DIAGNOSIS — E669 Obesity, unspecified: Secondary | ICD-10-CM | POA: Diagnosis not present

## 2018-11-12 DIAGNOSIS — G20 Parkinson's disease: Secondary | ICD-10-CM | POA: Diagnosis not present

## 2018-11-12 DIAGNOSIS — F329 Major depressive disorder, single episode, unspecified: Secondary | ICD-10-CM | POA: Diagnosis not present

## 2018-11-12 NOTE — Telephone Encounter (Signed)
Shanon Brow from [facility] is calling and would like to have verbal orders to continue Physical therapy with patient.   Physical therapy 2 x a week 6 weeks  He would also like to request starting OT and Speech Therapy evaluation.   The best call back number for Shanon Brow is [phone].

## 2018-11-13 NOTE — Telephone Encounter (Signed)
Helen Odom from [facility] given verbal orders. Hugh Garrow Kennon Holter, CMA

## 2018-11-13 NOTE — Telephone Encounter (Signed)
Please call in these orders  Thanks  [place]

## 2018-11-14 DIAGNOSIS — R471 Dysarthria and anarthria: Secondary | ICD-10-CM | POA: Diagnosis not present

## 2018-11-14 DIAGNOSIS — M159 Polyosteoarthritis, unspecified: Secondary | ICD-10-CM | POA: Diagnosis not present

## 2018-11-14 DIAGNOSIS — F329 Major depressive disorder, single episode, unspecified: Secondary | ICD-10-CM | POA: Diagnosis not present

## 2018-11-14 DIAGNOSIS — M810 Age-related osteoporosis without current pathological fracture: Secondary | ICD-10-CM | POA: Diagnosis not present

## 2018-11-14 DIAGNOSIS — G20 Parkinson's disease: Secondary | ICD-10-CM | POA: Diagnosis not present

## 2018-11-14 DIAGNOSIS — R1312 Dysphagia, oropharyngeal phase: Secondary | ICD-10-CM | POA: Diagnosis not present

## 2018-11-16 ENCOUNTER — Telehealth: Payer: Self-pay | Admitting: *Deleted

## 2018-11-16 DIAGNOSIS — M810 Age-related osteoporosis without current pathological fracture: Secondary | ICD-10-CM | POA: Diagnosis not present

## 2018-11-16 DIAGNOSIS — M159 Polyosteoarthritis, unspecified: Secondary | ICD-10-CM | POA: Diagnosis not present

## 2018-11-16 DIAGNOSIS — R1312 Dysphagia, oropharyngeal phase: Secondary | ICD-10-CM | POA: Diagnosis not present

## 2018-11-16 DIAGNOSIS — R471 Dysarthria and anarthria: Secondary | ICD-10-CM | POA: Diagnosis not present

## 2018-11-16 DIAGNOSIS — G20 Parkinson's disease: Secondary | ICD-10-CM | POA: Diagnosis not present

## 2018-11-16 DIAGNOSIS — F329 Major depressive disorder, single episode, unspecified: Secondary | ICD-10-CM | POA: Diagnosis not present

## 2018-11-16 NOTE — Telephone Encounter (Signed)
[place] calling for ST verbal orders as follows:  2 time(s) weekly for 4 week(s).  You can leave verbal orders on confidential voicemail.  Taheera Thomann, [name], CMA

## 2018-11-19 DIAGNOSIS — M159 Polyosteoarthritis, unspecified: Secondary | ICD-10-CM | POA: Diagnosis not present

## 2018-11-19 DIAGNOSIS — F329 Major depressive disorder, single episode, unspecified: Secondary | ICD-10-CM | POA: Diagnosis not present

## 2018-11-19 DIAGNOSIS — R1312 Dysphagia, oropharyngeal phase: Secondary | ICD-10-CM | POA: Diagnosis not present

## 2018-11-19 DIAGNOSIS — M810 Age-related osteoporosis without current pathological fracture: Secondary | ICD-10-CM | POA: Diagnosis not present

## 2018-11-19 DIAGNOSIS — G20 Parkinson's disease: Secondary | ICD-10-CM | POA: Diagnosis not present

## 2018-11-19 DIAGNOSIS — R471 Dysarthria and anarthria: Secondary | ICD-10-CM | POA: Diagnosis not present

## 2018-11-19 NOTE — Telephone Encounter (Signed)
Orders given  Patient has Parkinson's which can affect speech and swallowing

## 2018-11-21 DIAGNOSIS — F329 Major depressive disorder, single episode, unspecified: Secondary | ICD-10-CM | POA: Diagnosis not present

## 2018-11-21 DIAGNOSIS — R471 Dysarthria and anarthria: Secondary | ICD-10-CM | POA: Diagnosis not present

## 2018-11-21 DIAGNOSIS — M810 Age-related osteoporosis without current pathological fracture: Secondary | ICD-10-CM | POA: Diagnosis not present

## 2018-11-21 DIAGNOSIS — M159 Polyosteoarthritis, unspecified: Secondary | ICD-10-CM | POA: Diagnosis not present

## 2018-11-21 DIAGNOSIS — R1312 Dysphagia, oropharyngeal phase: Secondary | ICD-10-CM | POA: Diagnosis not present

## 2018-11-21 DIAGNOSIS — G20 Parkinson's disease: Secondary | ICD-10-CM | POA: Diagnosis not present

## 2018-11-22 ENCOUNTER — Telehealth: Payer: Self-pay

## 2018-11-22 DIAGNOSIS — R1312 Dysphagia, oropharyngeal phase: Secondary | ICD-10-CM | POA: Diagnosis not present

## 2018-11-22 DIAGNOSIS — R471 Dysarthria and anarthria: Secondary | ICD-10-CM | POA: Diagnosis not present

## 2018-11-22 DIAGNOSIS — G20 Parkinson's disease: Secondary | ICD-10-CM | POA: Diagnosis not present

## 2018-11-22 DIAGNOSIS — M159 Polyosteoarthritis, unspecified: Secondary | ICD-10-CM | POA: Diagnosis not present

## 2018-11-22 DIAGNOSIS — M810 Age-related osteoporosis without current pathological fracture: Secondary | ICD-10-CM | POA: Diagnosis not present

## 2018-11-22 DIAGNOSIS — F329 Major depressive disorder, single episode, unspecified: Secondary | ICD-10-CM | POA: Diagnosis not present

## 2018-11-22 NOTE — Telephone Encounter (Signed)
Sharyn Lull, [place] with [facility], called for verbal orders:  1. HH OT 1x week x 1, 2x week x 4, 1x week x 1  2. Nsg eval - has skin tear right middle finger and something going on with right pinky finger.  Call back is [phone]; ok to leave msg  Danley Danker, RN [facility] RN)

## 2018-11-23 NOTE — Telephone Encounter (Signed)
Sharyn Lull calling again.  Danley Danker, RN [facility] RN)

## 2018-11-23 NOTE — Telephone Encounter (Signed)
Called in orders as below

## 2018-11-26 DIAGNOSIS — R471 Dysarthria and anarthria: Secondary | ICD-10-CM | POA: Diagnosis not present

## 2018-11-26 DIAGNOSIS — R1312 Dysphagia, oropharyngeal phase: Secondary | ICD-10-CM | POA: Diagnosis not present

## 2018-11-26 DIAGNOSIS — F329 Major depressive disorder, single episode, unspecified: Secondary | ICD-10-CM | POA: Diagnosis not present

## 2018-11-26 DIAGNOSIS — G20 Parkinson's disease: Secondary | ICD-10-CM | POA: Diagnosis not present

## 2018-11-26 DIAGNOSIS — M810 Age-related osteoporosis without current pathological fracture: Secondary | ICD-10-CM | POA: Diagnosis not present

## 2018-11-26 DIAGNOSIS — M159 Polyosteoarthritis, unspecified: Secondary | ICD-10-CM | POA: Diagnosis not present

## 2018-11-28 DIAGNOSIS — G20 Parkinson's disease: Secondary | ICD-10-CM | POA: Diagnosis not present

## 2018-11-28 DIAGNOSIS — F329 Major depressive disorder, single episode, unspecified: Secondary | ICD-10-CM | POA: Diagnosis not present

## 2018-11-28 DIAGNOSIS — M159 Polyosteoarthritis, unspecified: Secondary | ICD-10-CM | POA: Diagnosis not present

## 2018-11-28 DIAGNOSIS — M810 Age-related osteoporosis without current pathological fracture: Secondary | ICD-10-CM | POA: Diagnosis not present

## 2018-11-28 DIAGNOSIS — R1312 Dysphagia, oropharyngeal phase: Secondary | ICD-10-CM | POA: Diagnosis not present

## 2018-11-28 DIAGNOSIS — R471 Dysarthria and anarthria: Secondary | ICD-10-CM | POA: Diagnosis not present

## 2018-11-29 DIAGNOSIS — G20 Parkinson's disease: Secondary | ICD-10-CM | POA: Diagnosis not present

## 2018-11-29 DIAGNOSIS — R471 Dysarthria and anarthria: Secondary | ICD-10-CM | POA: Diagnosis not present

## 2018-11-29 DIAGNOSIS — F329 Major depressive disorder, single episode, unspecified: Secondary | ICD-10-CM | POA: Diagnosis not present

## 2018-11-29 DIAGNOSIS — M810 Age-related osteoporosis without current pathological fracture: Secondary | ICD-10-CM | POA: Diagnosis not present

## 2018-11-29 DIAGNOSIS — M159 Polyosteoarthritis, unspecified: Secondary | ICD-10-CM | POA: Diagnosis not present

## 2018-11-29 DIAGNOSIS — R1312 Dysphagia, oropharyngeal phase: Secondary | ICD-10-CM | POA: Diagnosis not present

## 2018-11-30 ENCOUNTER — Ambulatory Visit: Payer: Medicare Other | Admitting: Nurse Practitioner

## 2018-11-30 DIAGNOSIS — R1312 Dysphagia, oropharyngeal phase: Secondary | ICD-10-CM | POA: Diagnosis not present

## 2018-11-30 DIAGNOSIS — G20 Parkinson's disease: Secondary | ICD-10-CM | POA: Diagnosis not present

## 2018-11-30 DIAGNOSIS — M159 Polyosteoarthritis, unspecified: Secondary | ICD-10-CM | POA: Diagnosis not present

## 2018-11-30 DIAGNOSIS — M810 Age-related osteoporosis without current pathological fracture: Secondary | ICD-10-CM | POA: Diagnosis not present

## 2018-11-30 DIAGNOSIS — F329 Major depressive disorder, single episode, unspecified: Secondary | ICD-10-CM | POA: Diagnosis not present

## 2018-11-30 DIAGNOSIS — R471 Dysarthria and anarthria: Secondary | ICD-10-CM | POA: Diagnosis not present

## 2018-12-03 DIAGNOSIS — F329 Major depressive disorder, single episode, unspecified: Secondary | ICD-10-CM | POA: Diagnosis not present

## 2018-12-03 DIAGNOSIS — R1312 Dysphagia, oropharyngeal phase: Secondary | ICD-10-CM | POA: Diagnosis not present

## 2018-12-03 DIAGNOSIS — R471 Dysarthria and anarthria: Secondary | ICD-10-CM | POA: Diagnosis not present

## 2018-12-03 DIAGNOSIS — M810 Age-related osteoporosis without current pathological fracture: Secondary | ICD-10-CM | POA: Diagnosis not present

## 2018-12-03 DIAGNOSIS — G20 Parkinson's disease: Secondary | ICD-10-CM | POA: Diagnosis not present

## 2018-12-03 DIAGNOSIS — M159 Polyosteoarthritis, unspecified: Secondary | ICD-10-CM | POA: Diagnosis not present

## 2018-12-04 DIAGNOSIS — R1312 Dysphagia, oropharyngeal phase: Secondary | ICD-10-CM | POA: Diagnosis not present

## 2018-12-04 DIAGNOSIS — M810 Age-related osteoporosis without current pathological fracture: Secondary | ICD-10-CM | POA: Diagnosis not present

## 2018-12-04 DIAGNOSIS — F329 Major depressive disorder, single episode, unspecified: Secondary | ICD-10-CM | POA: Diagnosis not present

## 2018-12-04 DIAGNOSIS — R471 Dysarthria and anarthria: Secondary | ICD-10-CM | POA: Diagnosis not present

## 2018-12-04 DIAGNOSIS — M159 Polyosteoarthritis, unspecified: Secondary | ICD-10-CM | POA: Diagnosis not present

## 2018-12-04 DIAGNOSIS — G20 Parkinson's disease: Secondary | ICD-10-CM | POA: Diagnosis not present

## 2018-12-05 DIAGNOSIS — R471 Dysarthria and anarthria: Secondary | ICD-10-CM | POA: Diagnosis not present

## 2018-12-05 DIAGNOSIS — R1312 Dysphagia, oropharyngeal phase: Secondary | ICD-10-CM | POA: Diagnosis not present

## 2018-12-05 DIAGNOSIS — M810 Age-related osteoporosis without current pathological fracture: Secondary | ICD-10-CM | POA: Diagnosis not present

## 2018-12-05 DIAGNOSIS — M159 Polyosteoarthritis, unspecified: Secondary | ICD-10-CM | POA: Diagnosis not present

## 2018-12-05 DIAGNOSIS — G20 Parkinson's disease: Secondary | ICD-10-CM | POA: Diagnosis not present

## 2018-12-05 DIAGNOSIS — F329 Major depressive disorder, single episode, unspecified: Secondary | ICD-10-CM | POA: Diagnosis not present

## 2018-12-06 DIAGNOSIS — M159 Polyosteoarthritis, unspecified: Secondary | ICD-10-CM | POA: Diagnosis not present

## 2018-12-06 DIAGNOSIS — F329 Major depressive disorder, single episode, unspecified: Secondary | ICD-10-CM | POA: Diagnosis not present

## 2018-12-06 DIAGNOSIS — R471 Dysarthria and anarthria: Secondary | ICD-10-CM | POA: Diagnosis not present

## 2018-12-06 DIAGNOSIS — R1312 Dysphagia, oropharyngeal phase: Secondary | ICD-10-CM | POA: Diagnosis not present

## 2018-12-06 DIAGNOSIS — G20 Parkinson's disease: Secondary | ICD-10-CM | POA: Diagnosis not present

## 2018-12-06 DIAGNOSIS — M810 Age-related osteoporosis without current pathological fracture: Secondary | ICD-10-CM | POA: Diagnosis not present

## 2018-12-07 ENCOUNTER — Encounter: Payer: Self-pay | Admitting: Family Medicine

## 2018-12-07 DIAGNOSIS — G20 Parkinson's disease: Secondary | ICD-10-CM | POA: Diagnosis not present

## 2018-12-07 DIAGNOSIS — R1312 Dysphagia, oropharyngeal phase: Secondary | ICD-10-CM | POA: Diagnosis not present

## 2018-12-07 DIAGNOSIS — R471 Dysarthria and anarthria: Secondary | ICD-10-CM | POA: Diagnosis not present

## 2018-12-07 DIAGNOSIS — F329 Major depressive disorder, single episode, unspecified: Secondary | ICD-10-CM | POA: Diagnosis not present

## 2018-12-07 DIAGNOSIS — M159 Polyosteoarthritis, unspecified: Secondary | ICD-10-CM | POA: Diagnosis not present

## 2018-12-07 DIAGNOSIS — M810 Age-related osteoporosis without current pathological fracture: Secondary | ICD-10-CM | POA: Diagnosis not present

## 2018-12-10 ENCOUNTER — Encounter: Payer: Self-pay | Admitting: Neurology

## 2018-12-10 DIAGNOSIS — M810 Age-related osteoporosis without current pathological fracture: Secondary | ICD-10-CM | POA: Diagnosis not present

## 2018-12-10 DIAGNOSIS — M159 Polyosteoarthritis, unspecified: Secondary | ICD-10-CM | POA: Diagnosis not present

## 2018-12-10 DIAGNOSIS — F329 Major depressive disorder, single episode, unspecified: Secondary | ICD-10-CM | POA: Diagnosis not present

## 2018-12-10 DIAGNOSIS — G20 Parkinson's disease: Secondary | ICD-10-CM | POA: Diagnosis not present

## 2018-12-10 DIAGNOSIS — R1312 Dysphagia, oropharyngeal phase: Secondary | ICD-10-CM | POA: Diagnosis not present

## 2018-12-10 DIAGNOSIS — R471 Dysarthria and anarthria: Secondary | ICD-10-CM | POA: Diagnosis not present

## 2018-12-11 ENCOUNTER — Telehealth: Payer: Self-pay

## 2018-12-11 DIAGNOSIS — R1312 Dysphagia, oropharyngeal phase: Secondary | ICD-10-CM | POA: Diagnosis not present

## 2018-12-11 DIAGNOSIS — M159 Polyosteoarthritis, unspecified: Secondary | ICD-10-CM | POA: Diagnosis not present

## 2018-12-11 DIAGNOSIS — M810 Age-related osteoporosis without current pathological fracture: Secondary | ICD-10-CM | POA: Diagnosis not present

## 2018-12-11 DIAGNOSIS — F329 Major depressive disorder, single episode, unspecified: Secondary | ICD-10-CM | POA: Diagnosis not present

## 2018-12-11 DIAGNOSIS — G20 Parkinson's disease: Secondary | ICD-10-CM | POA: Diagnosis not present

## 2018-12-11 DIAGNOSIS — R471 Dysarthria and anarthria: Secondary | ICD-10-CM | POA: Diagnosis not present

## 2018-12-11 NOTE — Telephone Encounter (Signed)
Sonal, SLP, called nurse line requesting VO to continue therapy with patient, as she is progressing well.  2x week for 3 more weeks  You can call in VO to [facility] [phone], ok to leave VM

## 2018-12-12 DIAGNOSIS — M159 Polyosteoarthritis, unspecified: Secondary | ICD-10-CM | POA: Diagnosis not present

## 2018-12-12 DIAGNOSIS — Z9181 History of falling: Secondary | ICD-10-CM | POA: Diagnosis not present

## 2018-12-12 DIAGNOSIS — E669 Obesity, unspecified: Secondary | ICD-10-CM | POA: Diagnosis not present

## 2018-12-12 DIAGNOSIS — R1312 Dysphagia, oropharyngeal phase: Secondary | ICD-10-CM | POA: Diagnosis not present

## 2018-12-12 DIAGNOSIS — F329 Major depressive disorder, single episode, unspecified: Secondary | ICD-10-CM | POA: Diagnosis not present

## 2018-12-12 DIAGNOSIS — M810 Age-related osteoporosis without current pathological fracture: Secondary | ICD-10-CM | POA: Diagnosis not present

## 2018-12-12 DIAGNOSIS — R471 Dysarthria and anarthria: Secondary | ICD-10-CM | POA: Diagnosis not present

## 2018-12-12 DIAGNOSIS — M24541 Contracture, right hand: Secondary | ICD-10-CM | POA: Diagnosis not present

## 2018-12-12 DIAGNOSIS — G20 Parkinson's disease: Secondary | ICD-10-CM | POA: Diagnosis not present

## 2018-12-12 DIAGNOSIS — M6281 Muscle weakness (generalized): Secondary | ICD-10-CM | POA: Diagnosis not present

## 2018-12-12 DIAGNOSIS — Z79891 Long term (current) use of opiate analgesic: Secondary | ICD-10-CM | POA: Diagnosis not present

## 2018-12-12 NOTE — Telephone Encounter (Signed)
Please call in these orders Thanks [place]

## 2018-12-12 NOTE — Telephone Encounter (Signed)
Veal orders given to sonal. [place] Alt, CMA

## 2018-12-13 DIAGNOSIS — M810 Age-related osteoporosis without current pathological fracture: Secondary | ICD-10-CM | POA: Diagnosis not present

## 2018-12-13 DIAGNOSIS — G20 Parkinson's disease: Secondary | ICD-10-CM | POA: Diagnosis not present

## 2018-12-13 DIAGNOSIS — R1312 Dysphagia, oropharyngeal phase: Secondary | ICD-10-CM | POA: Diagnosis not present

## 2018-12-13 DIAGNOSIS — F329 Major depressive disorder, single episode, unspecified: Secondary | ICD-10-CM | POA: Diagnosis not present

## 2018-12-13 DIAGNOSIS — R471 Dysarthria and anarthria: Secondary | ICD-10-CM | POA: Diagnosis not present

## 2018-12-13 DIAGNOSIS — M159 Polyosteoarthritis, unspecified: Secondary | ICD-10-CM | POA: Diagnosis not present

## 2018-12-14 DIAGNOSIS — R1312 Dysphagia, oropharyngeal phase: Secondary | ICD-10-CM | POA: Diagnosis not present

## 2018-12-14 DIAGNOSIS — M810 Age-related osteoporosis without current pathological fracture: Secondary | ICD-10-CM | POA: Diagnosis not present

## 2018-12-14 DIAGNOSIS — F329 Major depressive disorder, single episode, unspecified: Secondary | ICD-10-CM | POA: Diagnosis not present

## 2018-12-14 DIAGNOSIS — R471 Dysarthria and anarthria: Secondary | ICD-10-CM | POA: Diagnosis not present

## 2018-12-14 DIAGNOSIS — G20 Parkinson's disease: Secondary | ICD-10-CM | POA: Diagnosis not present

## 2018-12-14 DIAGNOSIS — M159 Polyosteoarthritis, unspecified: Secondary | ICD-10-CM | POA: Diagnosis not present

## 2018-12-17 DIAGNOSIS — R471 Dysarthria and anarthria: Secondary | ICD-10-CM | POA: Diagnosis not present

## 2018-12-17 DIAGNOSIS — G20 Parkinson's disease: Secondary | ICD-10-CM | POA: Diagnosis not present

## 2018-12-17 DIAGNOSIS — M159 Polyosteoarthritis, unspecified: Secondary | ICD-10-CM | POA: Diagnosis not present

## 2018-12-17 DIAGNOSIS — R1312 Dysphagia, oropharyngeal phase: Secondary | ICD-10-CM | POA: Diagnosis not present

## 2018-12-17 DIAGNOSIS — F329 Major depressive disorder, single episode, unspecified: Secondary | ICD-10-CM | POA: Diagnosis not present

## 2018-12-17 DIAGNOSIS — M810 Age-related osteoporosis without current pathological fracture: Secondary | ICD-10-CM | POA: Diagnosis not present

## 2018-12-18 DIAGNOSIS — M159 Polyosteoarthritis, unspecified: Secondary | ICD-10-CM | POA: Diagnosis not present

## 2018-12-18 DIAGNOSIS — R1312 Dysphagia, oropharyngeal phase: Secondary | ICD-10-CM | POA: Diagnosis not present

## 2018-12-18 DIAGNOSIS — G20 Parkinson's disease: Secondary | ICD-10-CM | POA: Diagnosis not present

## 2018-12-18 DIAGNOSIS — F329 Major depressive disorder, single episode, unspecified: Secondary | ICD-10-CM | POA: Diagnosis not present

## 2018-12-18 DIAGNOSIS — R471 Dysarthria and anarthria: Secondary | ICD-10-CM | POA: Diagnosis not present

## 2018-12-18 DIAGNOSIS — M810 Age-related osteoporosis without current pathological fracture: Secondary | ICD-10-CM | POA: Diagnosis not present

## 2018-12-19 DIAGNOSIS — M159 Polyosteoarthritis, unspecified: Secondary | ICD-10-CM | POA: Diagnosis not present

## 2018-12-19 DIAGNOSIS — F329 Major depressive disorder, single episode, unspecified: Secondary | ICD-10-CM | POA: Diagnosis not present

## 2018-12-19 DIAGNOSIS — G20 Parkinson's disease: Secondary | ICD-10-CM | POA: Diagnosis not present

## 2018-12-19 DIAGNOSIS — R471 Dysarthria and anarthria: Secondary | ICD-10-CM | POA: Diagnosis not present

## 2018-12-19 DIAGNOSIS — M810 Age-related osteoporosis without current pathological fracture: Secondary | ICD-10-CM | POA: Diagnosis not present

## 2018-12-19 DIAGNOSIS — R1312 Dysphagia, oropharyngeal phase: Secondary | ICD-10-CM | POA: Diagnosis not present

## 2018-12-20 ENCOUNTER — Telehealth: Payer: Self-pay

## 2018-12-20 DIAGNOSIS — M810 Age-related osteoporosis without current pathological fracture: Secondary | ICD-10-CM | POA: Diagnosis not present

## 2018-12-20 DIAGNOSIS — F329 Major depressive disorder, single episode, unspecified: Secondary | ICD-10-CM | POA: Diagnosis not present

## 2018-12-20 DIAGNOSIS — G20 Parkinson's disease: Secondary | ICD-10-CM | POA: Diagnosis not present

## 2018-12-20 DIAGNOSIS — R1312 Dysphagia, oropharyngeal phase: Secondary | ICD-10-CM | POA: Diagnosis not present

## 2018-12-20 DIAGNOSIS — R471 Dysarthria and anarthria: Secondary | ICD-10-CM | POA: Diagnosis not present

## 2018-12-20 DIAGNOSIS — M159 Polyosteoarthritis, unspecified: Secondary | ICD-10-CM | POA: Diagnosis not present

## 2018-12-20 NOTE — Telephone Encounter (Signed)
Helen Odom, [name] with [facility], called for verbal orders:  Change frequency of visits to 1w1, 2w1 with d/c end of next week.  Call back is [phone]. Ok to leave message.  Danley Danker, RN [facility] RN)

## 2018-12-20 NOTE — Telephone Encounter (Signed)
Please call in these orders Thanks [place]

## 2018-12-20 NOTE — Telephone Encounter (Signed)
Called and gave verbal orders to [place] at [place] per Dr. Erin Hearing.  Ozella Almond, [place]

## 2018-12-25 ENCOUNTER — Other Ambulatory Visit: Payer: Self-pay

## 2018-12-25 ENCOUNTER — Telehealth: Payer: Self-pay | Admitting: Family Medicine

## 2018-12-25 DIAGNOSIS — F329 Major depressive disorder, single episode, unspecified: Secondary | ICD-10-CM | POA: Diagnosis not present

## 2018-12-25 DIAGNOSIS — M810 Age-related osteoporosis without current pathological fracture: Secondary | ICD-10-CM | POA: Diagnosis not present

## 2018-12-25 DIAGNOSIS — M79641 Pain in right hand: Secondary | ICD-10-CM

## 2018-12-25 DIAGNOSIS — M159 Polyosteoarthritis, unspecified: Secondary | ICD-10-CM

## 2018-12-25 DIAGNOSIS — M47816 Spondylosis without myelopathy or radiculopathy, lumbar region: Secondary | ICD-10-CM

## 2018-12-25 DIAGNOSIS — R471 Dysarthria and anarthria: Secondary | ICD-10-CM | POA: Diagnosis not present

## 2018-12-25 DIAGNOSIS — G20 Parkinson's disease: Secondary | ICD-10-CM | POA: Diagnosis not present

## 2018-12-25 DIAGNOSIS — R1312 Dysphagia, oropharyngeal phase: Secondary | ICD-10-CM | POA: Diagnosis not present

## 2018-12-25 NOTE — Telephone Encounter (Signed)
Please call in these orders  Thanks 

## 2018-12-25 NOTE — Telephone Encounter (Signed)
Shanon Brow with [facility] is calling to ask for verbal orders. He would like to extend pts in home PT orders.   1 x week for 3 weeks.   The best call back number is [phone].

## 2018-12-26 MED ORDER — HYDROCODONE-ACETAMINOPHEN 5-325 MG PO TABS: 1.0000 | ORAL_TABLET | Freq: Four times a day (QID) | ORAL | 0 refills | Status: DC | PRN

## 2018-12-26 NOTE — Telephone Encounter (Signed)
Called and authorized verbal orders per Dr. Erin Hearing.  Helen Odom, [place]

## 2018-12-27 DIAGNOSIS — M810 Age-related osteoporosis without current pathological fracture: Secondary | ICD-10-CM | POA: Diagnosis not present

## 2018-12-27 DIAGNOSIS — M159 Polyosteoarthritis, unspecified: Secondary | ICD-10-CM | POA: Diagnosis not present

## 2018-12-27 DIAGNOSIS — R1312 Dysphagia, oropharyngeal phase: Secondary | ICD-10-CM | POA: Diagnosis not present

## 2018-12-27 DIAGNOSIS — R471 Dysarthria and anarthria: Secondary | ICD-10-CM | POA: Diagnosis not present

## 2018-12-27 DIAGNOSIS — F329 Major depressive disorder, single episode, unspecified: Secondary | ICD-10-CM | POA: Diagnosis not present

## 2018-12-27 DIAGNOSIS — G20 Parkinson's disease: Secondary | ICD-10-CM | POA: Diagnosis not present

## 2018-12-27 NOTE — Progress Notes (Signed)
Helen Odom was seen today in the movement disorders clinic for neurologic consultation at the request of Lind Covert, *.  The consultation is for the evaluation of Parkinson's disease.  This patient is accompanied in the office by her friend who supplements the history. The records that were made available to me were reviewed.  She has previously seen [facility] for the same.  She last saw Dr. Jaynee Eagles in July, 2019.  Pt first saw [facility] in 11/2014 and saw Dr. Felecia Shelling at that visit.  At that time, she c/o tremor in the R leg and L>R arm.  His records report that she actually saw a neurologist prior to him who diagnosed her with "parkinsonism" (states that she was seen in [facility] previously)  and she was already prescribed levodopa, but took a few doses and it made her nauseous.  Dr. Felecia Shelling did not feel that she had Parkinson's disease or parkinsonism.  He felt she had a mild essential tremor.  He felt that she could have cervical spondylosis or myelopathy because he noted clonus.  He recommended Valium 2 mg 3 times per day for her essential tremor and he thought it could also help her clonus, as she refused an MRI.  She was lost to follow-up until January, 2018 when she requested transfer of care to Dr. Jaynee Eagles.  She, likewise, did not feel that the patient had Parkinson's disease.  She also noted clonus in the right leg, but also noted increased tone in the left arm, hypomimia and stated that she thought that was from "a spinal cord process such as cervical spondylosis with myelopathy."  Patient also refused MRI again.  She ultimately recommended carbidopa/levodopa 25/100, half tablet twice per day.  She returned in May, 2018.  It is unclear if she ever took the levodopa, but she was not on it when she returned.  Gabapentin was recommended for her tremor, but pt states that she didn't take it long.  She followed back up in July, 2019 stating that speech was worse.  Stated that she was no longer able to  walk.  Tremor was getting worse.  It was recommended that she try levodopa again.  It was recommended that she take a half a tablet 3 times per day.  Patient stopped it because she thought it caused blurry vision.  Pt states that she started seeing things like "masses of bubbles" that weren't there.  She tried to start it again and the same thing happened so she backed down to carbidopa/levodopa 25/100, 1/2 tablet twice per day.  Swallow study was ordered, but the patient canceled that and did not complete it.   Pts friend states that they didn't do it because of the weather and also because her husband died.  She has ST in the home and her friend thinks that has helped both the voice and the swallowing.     Specific Symptoms:  Tremor: Yes.  , R foot and L hand.  Definitely at rest but has trouble stating with activation or not Family hx of similar:  Yes.  , mother with tremor Voice: better with ST Sleep: sleeps well at night and "cat naps" in the day  Vivid Dreams:  No.  Acting out dreams:  No. Wet Pillows: Yes.   Postural symptoms:  Yes.  , states that she hasn't walked since a hip fx over a year ago (year in December, 2019).  She has 24 hour per day caregiving x 3-4  years (started out for patients husband who had AD but patient was having trouble getting up even back then) Loss of smell:  No. Loss of taste:  No. Urinary Incontinence:  No. Difficulty Swallowing:  No. Handwriting, micrographia: unable to write x 2 years (states that R hand is constricted but doesn't know why - caregiver states that it happened in course of one week).   Trouble with ADL's:  Yes.    Trouble buttoning clothing: Yes.   Depression:  No. Memory changes:  No. - caregiver states that memory is very good Hallucinations:  Yes.  , sees people but knows not real (is usually off quite some distance)  visual distortions: No. N/V:  No. Lightheaded:  No.  Syncope: No. Diplopia:  No. Dyskinesia:  No. Prior exposure to  reglan/antipsychotics: No.  Neuroimaging of the brain has not previously been performed, as patient has declined.  PREVIOUS MEDICATIONS: carbidopa/levodopa 25/100 IR (hallucinations)  ALLERGIES:   Allergies  Allergen Reactions  . Flu Virus Vaccine Swelling  . Penicillins Rash    CURRENT MEDICATIONS:  Outpatient Encounter Medications as of 12/31/2018  Medication Sig  . acetaminophen (TYLENOL) 325 MG tablet Take 2 tablets (650 mg total) by mouth every 6 (six) hours as needed for mild pain (or Fever >/= 101).  . carbidopa-levodopa (SINEMET IR) 25-100 MG tablet Take 0.5 tablets by mouth 3 (three) times daily. Take 4 hours apart.Separate Sinemet from protein-rich foods by 30-60 mins.  Marland Kitchen diclofenac (VOLTAREN) 50 MG EC tablet Take 1 tablet (50 mg total) by mouth 2 (two) times daily.  Marland Kitchen esomeprazole (NEXIUM) 20 MG capsule Take 20 mg by mouth daily at 12 noon.  Marland Kitchen HYDROcodone-acetaminophen (NORCO/VICODIN) 5-325 MG tablet Take 1 tablet by mouth every 6 (six) hours as needed for moderate pain.  Marland Kitchen loratadine (CLARITIN) 10 MG tablet Take 10 mg by mouth daily.  . sertraline (ZOLOFT) 100 MG tablet Take 1 tablet (100 mg total) daily by mouth.  . tizanidine (ZANAFLEX) 2 MG capsule Take 1 capsule (2 mg total) by mouth 3 (three) times daily as needed for muscle spasms.  . [DISCONTINUED] aspirin EC 81 MG tablet Take 81 mg by mouth daily.  . [DISCONTINUED] cholecalciferol (VITAMIN D) 1000 UNITS tablet Take 1,000 Units by mouth daily.  . [DISCONTINUED] gabapentin (NEURONTIN) 100 MG capsule Take 1 capsule (100 mg total) by mouth 3 (three) times daily.  . [DISCONTINUED] hydrocortisone 2.5 % cream Apply topically 2 (two) times daily.  . [DISCONTINUED] vitamin B-12 (CYANOCOBALAMIN) 1000 MCG tablet Take 1,000 mcg by mouth daily.   No facility-administered encounter medications on file as of 12/31/2018.     PAST MEDICAL HISTORY:   Past Medical History:  Diagnosis Date  . Arthritis   . Cancer ([place])   . Colon  cancer ([place])   . Depression   . Heart murmur   . Hyperlipidemia   . Movement disorder   . Neuropathy   . Osteoporosis   . Vision abnormalities     PAST SURGICAL HISTORY:   Past Surgical History:  Procedure Laterality Date  . CATARACT EXTRACTION, BILATERAL Bilateral 2015  . CHOLECYSTECTOMY    . COLON RESECTION    . HIP PINNING,CANNULATED Right 10/13/2016   Procedure: RIGHT HIP PINNING;  Surgeon: Renette Butters, MD;  Location: [facility];  Service: Orthopedics;  Laterality: Right;    SOCIAL HISTORY:   Social History   Socioeconomic History  . Marital status: Married    Spouse name: Not on file  . Number of  children: Not on file  . Years of education: Not on file  . Highest education level: Not on file  Occupational History  . Not on file  Social Needs  . Financial resource strain: Not on file  . Food insecurity:    Worry: Not on file    Inability: Not on file  . Transportation needs:    Medical: Not on file    Non-medical: Not on file  Tobacco Use  . Smoking status: Never Smoker  . Smokeless tobacco: Never Used  Substance and Sexual Activity  . Alcohol use: Yes    Alcohol/week: 0.0 standard drinks    Comment: [place] about once per month  . Drug use: No  . Sexual activity: Never  Lifestyle  . Physical activity:    Days per week: Not on file    Minutes per session: Not on file  . Stress: Not on file  Relationships  . Social connections:    Talks on phone: Not on file    Gets together: Not on file    Attends religious service: Not on file    Active member of club or organization: Not on file    Attends meetings of clubs or organizations: Not on file    Relationship status: Not on file  . Intimate partner violence:    Fear of current or ex partner: Not on file    Emotionally abused: Not on file    Physically abused: Not on file    Forced sexual activity: Not on file  Other Topics Concern  . Not on file  Social History Narrative   Lives with her husband who  has moderate Alz.   They are in an apartment with 24 hour assistance.   Her niece [name] is her most local relation.  She is in IT at [employer].      Current Social History   04/18/2017   Who lives at home: Husband, [name] with worsening alzheimer's and 24 hour aids 04/18/2017    Transportation: Has own transportation and aids take her to appts 04/18/2017   Important Relationships & Pets: [place] Rod [place]ler and [place], [place] Ed and Maudie Mercury (Aid); no pets 04/18/2017    Current Stressors: Husband with worsening Alzheimer's, patient's health and employee problems 04/18/2017   Work / Education:  Never worked outside home/completed HS 04/18/2017   Religious / Personal Beliefs: "I believe in God" 04/18/2017   Interests / Fun: Shopping on-line 04/18/2017   L. [place]atte, RN, BSN                                                                                                        FAMILY HISTORY:   Family Status  Relation Name Status  . Mother  Deceased at age 70       CHF  . Father  Deceased at age 35       MI  . Brother  Alive, age 58y    ROS:  Review of Systems  Constitutional: Negative.   HENT: Negative.   Eyes: Negative.   Respiratory: Negative.  Cardiovascular: Negative.   Genitourinary: Negative.   Musculoskeletal: Positive for back pain (not consistent and just "sore"). Negative for neck pain.  Skin: Negative.   Endo/Heme/Allergies: Negative.   Psychiatric/Behavioral: Negative.     PHYSICAL EXAMINATION:    VITALS:   Vitals:   12/31/18 1313  BP: 122/80  Pulse: 80  SpO2: 94%    GEN:  The patient appears stated age and is in NAD. HEENT:  Normocephalic, atraumatic.  The mucous membranes are moist. The superficial temporal arteries are without ropiness or tenderness. CV:  RRR Lungs:  CTAB Neck/HEME:  There are no carotid bruits bilaterally.  Neurological examination:  Orientation: The patient is alert and oriented x3. Fund of knowledge is appropriate.  Recent and remote memory are  intact.  Attention and concentration are normal.    Able to name objects and repeat phrases. Cranial nerves: There is good facial symmetry.  She has mild facial hypomimia.  Pupils are equal round and reactive to light bilaterally. Unable to see fundus on the right.  Clear on the L.  Extraocular muscles are intact. The visual fields are full to confrontational testing. The speech is fluent and clear.  She is slightly hypophonic.  Soft palate rises symmetrically and there is no tongue deviation. Hearing is intact to conversational tone. Sensation: Sensation is intact to light and pinprick throughout (facial, trunk, extremities). Vibration is intact at the bilateral big toe. There is no extinction with double simultaneous stimulation. There is no sensory dermatomal level identified. Motor:  The R pinky, ring, and middle finger are flexed into the fist, but she is still able to grasp with that hand if something is placed in it.  Strength is otherwise 5/5 in the right upper extremity and 4+/5 in the right lower extremity.  Strength is 5/5 in the left upper and lower extremities.  Patient is slumped to the right in her chair. Deep tendon reflexes: Deep tendon reflexes are 2+-3-/4 at the bilateral biceps, triceps, brachioradialis, patella and achilles. Plantar response is upgoing on the left and neutral on the right.    Movement examination: Tone: The R pinky, ring, and middle finger are flexed into the fist.  Otherwise, tone is normal elsewhere once she is able to relax completely.   Abnormal movements: The patient does have left upper extremity resting tremor (very mild) and right lower extremity resting tremor.  The right lower extremity resting tremor was not clonus.  No clonus was initiated. Coordination:  There is decremation with RAM's, with any form of RAMS, including alternating supination and pronation of the forearm, hand opening and closing, finger taps, heel taps and toe taps on the left.  She has  difficulty performing rapid alternating movements in the right hand because of the fact that it is fisted.  She is just somewhat slow with foot taps on the right. Gait and Station: The patient is in a wheelchair and unable to ambulate.  ASSESSMENT/PLAN:  1.  Parkinsonism  -Patient does have features of parkinsonism, but it is very difficult to tell if she meets [place] brain bank criteria as she has other neurologic issues that prevent a complete exam (right hand fisted, unable to walk).  Regardless, even small doses of immediate release levodopa have caused hallucinations.  She would like to try something else.  I am really not sure that even if we try something that is helpful for tremor, I am not sure how much that will be impactful for her daily life.  She would  like to try.  First we will just try the extended release form of levodopa.  We will try carbidopa/levodopa 25/100 CR, 1 tablet in the morning for 2 weeks, then she will go to twice daily for 2 weeks and then, if tolerated, she will go to 3 times per day.  Discussed extensively risk, benefits, side effects.  -As was previously discussed with the other neurologist that she has seen, I suspect that there is something else going on, primarily an upper motor neuron issue.  My suspicion is that she likely had a stroke that caused her hand to claw acutely with some weakness on the right leg on exam, and potentially also caused her troubles with walking, although they states she really has not walked since a hip fracture.  I told her that she could have a cervical spine issue as well, but I suspect that this is a brain issue.  She is very claustrophobic and refuses MRI.  She also refuses CT, stating that even that degree of enclosure causes significant anxiety.  She and I discussed potential ramifications of not doing anything, and she expressed understanding.  She does not wish to proceed with any type of neuro imaging.  The patient was alert and oriented  today x3.  2.  Patient does wish to follow here, although it is very difficult for her caregivers to get her here.  I will plan on seeing her back in the next 5 to 6 months.  She will call me and let me know how she does with the levodopa before that time.  Greater than 50% of this 60-minute visit was spent in counseling with the patient and her caregiver.  This did not include the 40 min of record review which was detailed above, which was non face to face time.   Cc:  Lind Covert, MD

## 2018-12-28 DIAGNOSIS — G20 Parkinson's disease: Secondary | ICD-10-CM | POA: Diagnosis not present

## 2018-12-28 DIAGNOSIS — M159 Polyosteoarthritis, unspecified: Secondary | ICD-10-CM | POA: Diagnosis not present

## 2018-12-28 DIAGNOSIS — M810 Age-related osteoporosis without current pathological fracture: Secondary | ICD-10-CM | POA: Diagnosis not present

## 2018-12-28 DIAGNOSIS — R1312 Dysphagia, oropharyngeal phase: Secondary | ICD-10-CM | POA: Diagnosis not present

## 2018-12-28 DIAGNOSIS — R471 Dysarthria and anarthria: Secondary | ICD-10-CM | POA: Diagnosis not present

## 2018-12-28 DIAGNOSIS — F329 Major depressive disorder, single episode, unspecified: Secondary | ICD-10-CM | POA: Diagnosis not present

## 2018-12-31 ENCOUNTER — Ambulatory Visit (INDEPENDENT_AMBULATORY_CARE_PROVIDER_SITE_OTHER): Payer: Medicare Other | Admitting: Neurology

## 2018-12-31 ENCOUNTER — Encounter: Payer: Self-pay | Admitting: Neurology

## 2018-12-31 VITALS — BP 122/80 | HR 80

## 2018-12-31 DIAGNOSIS — G20 Parkinson's disease: Secondary | ICD-10-CM

## 2018-12-31 DIAGNOSIS — R531 Weakness: Secondary | ICD-10-CM

## 2018-12-31 MED ORDER — CARBIDOPA-LEVODOPA ER 25-100 MG PO TBCR: 1.0000 | EXTENDED_RELEASE_TABLET | Freq: Three times a day (TID) | ORAL | 1 refills | Status: DC

## 2018-12-31 NOTE — Patient Instructions (Addendum)
1. Stop Carbidopa Levodopa IR 25/100.  Start Carbidopa Levodopa CR 25/100 as follows:  1 tablet daily for two weeks, then 1 tablet twice daily for two weeks, then 1 tablet three times daily. Call us if you develop hallucinations.

## 2019-01-01 DIAGNOSIS — M810 Age-related osteoporosis without current pathological fracture: Secondary | ICD-10-CM | POA: Diagnosis not present

## 2019-01-01 DIAGNOSIS — G20 Parkinson's disease: Secondary | ICD-10-CM | POA: Diagnosis not present

## 2019-01-01 DIAGNOSIS — R471 Dysarthria and anarthria: Secondary | ICD-10-CM | POA: Diagnosis not present

## 2019-01-01 DIAGNOSIS — M159 Polyosteoarthritis, unspecified: Secondary | ICD-10-CM | POA: Diagnosis not present

## 2019-01-01 DIAGNOSIS — R1312 Dysphagia, oropharyngeal phase: Secondary | ICD-10-CM | POA: Diagnosis not present

## 2019-01-01 DIAGNOSIS — F329 Major depressive disorder, single episode, unspecified: Secondary | ICD-10-CM | POA: Diagnosis not present

## 2019-01-02 ENCOUNTER — Telehealth: Payer: Self-pay | Admitting: *Deleted

## 2019-01-02 NOTE — Telephone Encounter (Signed)
Sonal from [facility] calling for ST verbal orders as follows:  2 time(s) weekly for 4 week(s) to improve vocal volume and endurance.  You can leave verbal orders on confidential voicemail.  [place], [name], CMA

## 2019-01-03 ENCOUNTER — Encounter: Payer: Self-pay | Admitting: Family Medicine

## 2019-01-03 DIAGNOSIS — F329 Major depressive disorder, single episode, unspecified: Secondary | ICD-10-CM | POA: Diagnosis not present

## 2019-01-03 DIAGNOSIS — M810 Age-related osteoporosis without current pathological fracture: Secondary | ICD-10-CM | POA: Diagnosis not present

## 2019-01-03 DIAGNOSIS — R471 Dysarthria and anarthria: Secondary | ICD-10-CM | POA: Diagnosis not present

## 2019-01-03 DIAGNOSIS — R1312 Dysphagia, oropharyngeal phase: Secondary | ICD-10-CM | POA: Diagnosis not present

## 2019-01-03 DIAGNOSIS — M159 Polyosteoarthritis, unspecified: Secondary | ICD-10-CM | POA: Diagnosis not present

## 2019-01-03 DIAGNOSIS — G20 Parkinson's disease: Secondary | ICD-10-CM | POA: Diagnosis not present

## 2019-01-03 NOTE — Telephone Encounter (Signed)
Called Sonal and authorized verbal orders per Dr. Erin Hearing.  Ozella Almond, [place]

## 2019-01-03 NOTE — Telephone Encounter (Signed)
Please call in these orders  Thanks 

## 2019-01-06 ENCOUNTER — Telehealth: Payer: Self-pay | Admitting: Family Medicine

## 2019-01-06 MED ORDER — GUAIFENESIN-CODEINE 100-10 MG/5ML PO SYRP: 5.0000 mL | ORAL_SOLUTION | Freq: Three times a day (TID) | ORAL | 0 refills | Status: DC | PRN

## 2019-01-06 NOTE — Telephone Encounter (Signed)
**  After Hours/ Emergency Line Call**  Received a call to report that Helen Odom has had a productive cough with yellow sputum since last night. It is associated with "harder breathing" however is not short of breath. Endorses +sick contact as one of her caregivers has a bad cold. She denies chest pain. She has not checked for a fever. She otherwise is her normal self. She tried mucinex once last night that did not help. States Dr. Erin Hearing normally calls in codeine cough syrup whenever she has a cold, visualized on med list so will call this in. Advised to RTC if she develops fever, difficulties breathing or is not better in the next couple of weeks. Red flags discussed.  Will forward to PCP.  Rory Percy, DO PGY-2, [facility] 01/06/2019 9:58 AM

## 2019-01-06 NOTE — Telephone Encounter (Signed)
Opened in error

## 2019-01-09 DIAGNOSIS — G20 Parkinson's disease: Secondary | ICD-10-CM | POA: Diagnosis not present

## 2019-01-09 DIAGNOSIS — M810 Age-related osteoporosis without current pathological fracture: Secondary | ICD-10-CM | POA: Diagnosis not present

## 2019-01-09 DIAGNOSIS — R1312 Dysphagia, oropharyngeal phase: Secondary | ICD-10-CM | POA: Diagnosis not present

## 2019-01-09 DIAGNOSIS — F329 Major depressive disorder, single episode, unspecified: Secondary | ICD-10-CM | POA: Diagnosis not present

## 2019-01-09 DIAGNOSIS — R471 Dysarthria and anarthria: Secondary | ICD-10-CM | POA: Diagnosis not present

## 2019-01-09 DIAGNOSIS — M159 Polyosteoarthritis, unspecified: Secondary | ICD-10-CM | POA: Diagnosis not present

## 2019-01-11 ENCOUNTER — Other Ambulatory Visit: Payer: Self-pay | Admitting: Family Medicine

## 2019-01-11 ENCOUNTER — Other Ambulatory Visit: Payer: Self-pay

## 2019-01-11 DIAGNOSIS — M810 Age-related osteoporosis without current pathological fracture: Secondary | ICD-10-CM | POA: Diagnosis not present

## 2019-01-11 DIAGNOSIS — R471 Dysarthria and anarthria: Secondary | ICD-10-CM | POA: Diagnosis not present

## 2019-01-11 DIAGNOSIS — M159 Polyosteoarthritis, unspecified: Secondary | ICD-10-CM | POA: Diagnosis not present

## 2019-01-11 DIAGNOSIS — G20 Parkinson's disease: Secondary | ICD-10-CM | POA: Diagnosis not present

## 2019-01-11 DIAGNOSIS — Z79891 Long term (current) use of opiate analgesic: Secondary | ICD-10-CM | POA: Diagnosis not present

## 2019-01-11 DIAGNOSIS — E669 Obesity, unspecified: Secondary | ICD-10-CM | POA: Diagnosis not present

## 2019-01-11 DIAGNOSIS — F329 Major depressive disorder, single episode, unspecified: Secondary | ICD-10-CM | POA: Diagnosis not present

## 2019-01-11 DIAGNOSIS — Z9181 History of falling: Secondary | ICD-10-CM | POA: Diagnosis not present

## 2019-01-11 MED ORDER — TIZANIDINE HCL 2 MG PO CAPS: 2.0000 mg | ORAL_CAPSULE | Freq: Three times a day (TID) | ORAL | 1 refills | Status: DC | PRN

## 2019-01-15 DIAGNOSIS — M159 Polyosteoarthritis, unspecified: Secondary | ICD-10-CM | POA: Diagnosis not present

## 2019-01-15 DIAGNOSIS — M810 Age-related osteoporosis without current pathological fracture: Secondary | ICD-10-CM | POA: Diagnosis not present

## 2019-01-15 DIAGNOSIS — R471 Dysarthria and anarthria: Secondary | ICD-10-CM | POA: Diagnosis not present

## 2019-01-15 DIAGNOSIS — F329 Major depressive disorder, single episode, unspecified: Secondary | ICD-10-CM | POA: Diagnosis not present

## 2019-01-15 DIAGNOSIS — G20 Parkinson's disease: Secondary | ICD-10-CM | POA: Diagnosis not present

## 2019-01-15 DIAGNOSIS — E669 Obesity, unspecified: Secondary | ICD-10-CM | POA: Diagnosis not present

## 2019-01-16 ENCOUNTER — Other Ambulatory Visit: Payer: Self-pay | Admitting: Family Medicine

## 2019-01-17 DIAGNOSIS — F329 Major depressive disorder, single episode, unspecified: Secondary | ICD-10-CM | POA: Diagnosis not present

## 2019-01-17 DIAGNOSIS — M159 Polyosteoarthritis, unspecified: Secondary | ICD-10-CM | POA: Diagnosis not present

## 2019-01-17 DIAGNOSIS — R471 Dysarthria and anarthria: Secondary | ICD-10-CM | POA: Diagnosis not present

## 2019-01-17 DIAGNOSIS — E669 Obesity, unspecified: Secondary | ICD-10-CM | POA: Diagnosis not present

## 2019-01-17 DIAGNOSIS — M810 Age-related osteoporosis without current pathological fracture: Secondary | ICD-10-CM | POA: Diagnosis not present

## 2019-01-17 DIAGNOSIS — G20 Parkinson's disease: Secondary | ICD-10-CM | POA: Diagnosis not present

## 2019-01-22 ENCOUNTER — Other Ambulatory Visit: Payer: Self-pay | Admitting: *Deleted

## 2019-01-22 DIAGNOSIS — F329 Major depressive disorder, single episode, unspecified: Secondary | ICD-10-CM | POA: Diagnosis not present

## 2019-01-22 DIAGNOSIS — M47816 Spondylosis without myelopathy or radiculopathy, lumbar region: Secondary | ICD-10-CM

## 2019-01-22 DIAGNOSIS — M810 Age-related osteoporosis without current pathological fracture: Secondary | ICD-10-CM | POA: Diagnosis not present

## 2019-01-22 DIAGNOSIS — M79641 Pain in right hand: Secondary | ICD-10-CM

## 2019-01-22 DIAGNOSIS — G20 Parkinson's disease: Secondary | ICD-10-CM | POA: Diagnosis not present

## 2019-01-22 DIAGNOSIS — M159 Polyosteoarthritis, unspecified: Secondary | ICD-10-CM | POA: Diagnosis not present

## 2019-01-22 DIAGNOSIS — R471 Dysarthria and anarthria: Secondary | ICD-10-CM | POA: Diagnosis not present

## 2019-01-22 DIAGNOSIS — E669 Obesity, unspecified: Secondary | ICD-10-CM | POA: Diagnosis not present

## 2019-01-22 NOTE — Telephone Encounter (Signed)
Is requesting to have this sent early and placed on hold due to Covid-19.  Does not want her to run out.  Christen Bame, CMA

## 2019-01-23 ENCOUNTER — Encounter: Payer: Self-pay | Admitting: Family Medicine

## 2019-01-23 MED ORDER — HYDROCODONE-ACETAMINOPHEN 5-325 MG PO TABS: 1.0000 | ORAL_TABLET | Freq: Four times a day (QID) | ORAL | 0 refills | Status: DC | PRN

## 2019-01-24 DIAGNOSIS — M159 Polyosteoarthritis, unspecified: Secondary | ICD-10-CM | POA: Diagnosis not present

## 2019-01-24 DIAGNOSIS — E669 Obesity, unspecified: Secondary | ICD-10-CM | POA: Diagnosis not present

## 2019-01-24 DIAGNOSIS — F329 Major depressive disorder, single episode, unspecified: Secondary | ICD-10-CM | POA: Diagnosis not present

## 2019-01-24 DIAGNOSIS — G20 Parkinson's disease: Secondary | ICD-10-CM | POA: Diagnosis not present

## 2019-01-24 DIAGNOSIS — R471 Dysarthria and anarthria: Secondary | ICD-10-CM | POA: Diagnosis not present

## 2019-01-24 DIAGNOSIS — M810 Age-related osteoporosis without current pathological fracture: Secondary | ICD-10-CM | POA: Diagnosis not present

## 2019-02-01 ENCOUNTER — Other Ambulatory Visit: Payer: Self-pay | Admitting: Family Medicine

## 2019-02-01 DIAGNOSIS — F3289 Other specified depressive episodes: Secondary | ICD-10-CM

## 2019-02-08 ENCOUNTER — Telehealth: Payer: Self-pay

## 2019-02-08 NOTE — Telephone Encounter (Signed)
Sonal, speech therapist, called nurse line to inform of therapy being put on hold for 2 weeks, due to covid. If not ok, please call her. If in agreeance, nothing further to do.   Sonal [phone]

## 2019-02-28 ENCOUNTER — Other Ambulatory Visit: Payer: Self-pay

## 2019-02-28 DIAGNOSIS — M47816 Spondylosis without myelopathy or radiculopathy, lumbar region: Secondary | ICD-10-CM

## 2019-02-28 DIAGNOSIS — M79641 Pain in right hand: Secondary | ICD-10-CM

## 2019-02-28 DIAGNOSIS — M159 Polyosteoarthritis, unspecified: Secondary | ICD-10-CM

## 2019-03-01 ENCOUNTER — Encounter: Payer: Self-pay | Admitting: Family Medicine

## 2019-03-01 MED ORDER — HYDROCODONE-ACETAMINOPHEN 5-325 MG PO TABS: 1.0000 | ORAL_TABLET | Freq: Four times a day (QID) | ORAL | 0 refills | Status: DC | PRN

## 2019-04-16 ENCOUNTER — Other Ambulatory Visit: Payer: Self-pay

## 2019-04-16 DIAGNOSIS — M159 Polyosteoarthritis, unspecified: Secondary | ICD-10-CM

## 2019-04-16 DIAGNOSIS — M79641 Pain in right hand: Secondary | ICD-10-CM

## 2019-04-16 DIAGNOSIS — M47816 Spondylosis without myelopathy or radiculopathy, lumbar region: Secondary | ICD-10-CM

## 2019-04-17 ENCOUNTER — Other Ambulatory Visit: Payer: Self-pay

## 2019-04-17 ENCOUNTER — Ambulatory Visit (INDEPENDENT_AMBULATORY_CARE_PROVIDER_SITE_OTHER): Payer: Medicare Other | Admitting: Family Medicine

## 2019-04-17 DIAGNOSIS — G20 Parkinson's disease: Secondary | ICD-10-CM

## 2019-04-17 DIAGNOSIS — M150 Primary generalized (osteo)arthritis: Secondary | ICD-10-CM

## 2019-04-17 DIAGNOSIS — F320 Major depressive disorder, single episode, mild: Secondary | ICD-10-CM

## 2019-04-17 DIAGNOSIS — M159 Polyosteoarthritis, unspecified: Secondary | ICD-10-CM

## 2019-04-17 MED ORDER — TIZANIDINE HCL 2 MG PO CAPS: 2.0000 mg | ORAL_CAPSULE | Freq: Three times a day (TID) | ORAL | 1 refills | Status: DC | PRN

## 2019-04-17 MED ORDER — HYDROCODONE-ACETAMINOPHEN 5-325 MG PO TABS: 1.0000 | ORAL_TABLET | Freq: Four times a day (QID) | ORAL | 0 refills | Status: DC | PRN

## 2019-04-17 NOTE — Assessment & Plan Note (Signed)
Not tolerating levodopa.  Likely will have slow progression

## 2019-04-17 NOTE — Assessment & Plan Note (Signed)
Pain is controlled on multiple analgesics without significant side effects.  Will continue

## 2019-04-17 NOTE — Progress Notes (Signed)
[facility] Tele[facility]icine Visit  Patient consented to have virtual visit. Method of visit: Telephone  Encounter participants: Patient: Helen Odom - located at home Provider: Lind Covert - located at office Others (if applicable): no  Chief Complaint: Follow Up  DEPRESSION Feeling pretty well now.  Feels her spirits are good.  Much improved after her husband died.  No signs of SI.  Taking zoloft regularly  PARKINSONS Stopped the levodopa since effected her eye sight.  Did not seem to help much anyway.  No problems swallowing.  Her mobility is about the same - needs assistance with any standing  DJD Pain is controlled with combination of tylenol, zanaflex, hydrocodone, aspercreme.  Some days are worse than others but overall stable.  No specific joint redness or rash  ROS: per HPI  Pertinent PMHx: had been seeing neurology for Parkinsons  Exam:  Respiratory: normal speech for her   Assessment/Plan:  Parkinson disease ([place]) Not tolerating levodopa.  Likely will have slow progression   DJD (degenerative joint disease), multiple sites Pain is controlled on multiple analgesics without significant side effects.  Will continue   Depression Improved.  She is content with current state.     Time spent during visit with patient: 12 minutes

## 2019-04-17 NOTE — Assessment & Plan Note (Signed)
Improved.  She is content with current state.

## 2019-05-14 ENCOUNTER — Telehealth: Payer: Self-pay | Admitting: Family Medicine

## 2019-05-14 DIAGNOSIS — G20 Parkinson's disease: Secondary | ICD-10-CM

## 2019-05-14 NOTE — Telephone Encounter (Signed)
Helen Odom is calling for the patient and pt would like to know if she could have a referral for in home physical and speech therapy.

## 2019-05-15 ENCOUNTER — Telehealth: Payer: Self-pay | Admitting: Licensed Clinical Social Worker

## 2019-05-15 NOTE — Telephone Encounter (Signed)
Care Coordination  Telephone Outreach Note  05/15/2019 Name: Helen Odom MRN: [id number] DOB: July 27, 1931  Reason for referral : [facility] ([place])  Phone call to Ms. Helen Odom reference assessing for the above referral.  LCSW received voice message from patient's aide Helen Odom to call patient with home health resources to assist her with locating an additional aide.  Patient has 24 hour care with several aides. Patient requested to have the information e-mailed. Information was e-mailed to patient.  Plan: Patient will call or have Helen Odom call if she needs additional assistance after reviewing the information.   Casimer Lanius, LCSW [facility]   [phone] 1:15 PM

## 2019-05-15 NOTE — Telephone Encounter (Signed)
I put in referrals for both Please let Suanne Marker know Thanks [place]

## 2019-05-15 NOTE — Telephone Encounter (Signed)
Called Helen Odom and she put me on speaker phone so that patient could hear.  Informed patient of referral.  Changed address per Suanne Marker.  Ozella Almond, [place]

## 2019-05-17 NOTE — Telephone Encounter (Signed)
Janalyn Harder called back to get an update on referral for speech therapy and in home PT. Notified Suanne Marker they were sent to [facility] and she said the patient would like them both sent to [facility] instead. Please send these to [facility] as soon as possible. Told Rhona out referral coordinator is out for the rest of the afternoon and she will be back Monday morning. Please advise.

## 2019-05-21 NOTE — Telephone Encounter (Signed)
Kennyth Lose  Can you change this referral to [facility] (see notes)  Thanks  Truman Hayward

## 2019-05-23 ENCOUNTER — Telehealth: Payer: Self-pay | Admitting: *Deleted

## 2019-05-23 NOTE — Telephone Encounter (Signed)
Please call in those orders Thanks [place]

## 2019-05-23 NOTE — Telephone Encounter (Signed)
[place], PT from [facility] needs verbal orders for a start of care date.  Pt is in the process of moving and needed to wait on PT.  Needs new verbal order to go out on Thursday or Friday.  [place], CMA

## 2019-05-27 ENCOUNTER — Telehealth: Payer: Self-pay | Admitting: Family Medicine

## 2019-05-27 DIAGNOSIS — M159 Polyosteoarthritis, unspecified: Secondary | ICD-10-CM

## 2019-05-27 DIAGNOSIS — M79641 Pain in right hand: Secondary | ICD-10-CM

## 2019-05-27 DIAGNOSIS — M47816 Spondylosis without myelopathy or radiculopathy, lumbar region: Secondary | ICD-10-CM

## 2019-05-27 MED ORDER — HYDROCODONE-ACETAMINOPHEN 5-325 MG PO TABS: 1.0000 | ORAL_TABLET | Freq: Four times a day (QID) | ORAL | 0 refills | Status: DC | PRN

## 2019-05-27 NOTE — Telephone Encounter (Signed)
Pt has an order for speech and physical therapy and pt's friend states that pt wants to do services with [facility] and that they aren't taking anything until the week of the 27th of [place]a [place] needs verbal orders stating that it's ok for Ms.Doiron to wait until 27th.

## 2019-05-27 NOTE — Telephone Encounter (Signed)
Spoke to [place] and she states they wanted a letter to state that and that she could come pick it up and give it to them. Helen Odom Kennon Holter, CMA

## 2019-05-27 NOTE — Telephone Encounter (Signed)
Pt's friend  Kizzie Fantasia called to get an refill on her HYDROcodone, and pt has enough medication for tomorrow.

## 2019-05-27 NOTE — Telephone Encounter (Signed)
Please call in the verbal message  thahks

## 2019-05-30 NOTE — Telephone Encounter (Signed)
Called and LVM for [place] of verbal orders for patient.  Ozella Almond, [place]

## 2019-06-03 DIAGNOSIS — G20 Parkinson's disease: Secondary | ICD-10-CM | POA: Diagnosis not present

## 2019-06-03 DIAGNOSIS — M159 Polyosteoarthritis, unspecified: Secondary | ICD-10-CM | POA: Diagnosis not present

## 2019-06-03 DIAGNOSIS — Z9181 History of falling: Secondary | ICD-10-CM | POA: Diagnosis not present

## 2019-06-03 DIAGNOSIS — Z79891 Long term (current) use of opiate analgesic: Secondary | ICD-10-CM | POA: Diagnosis not present

## 2019-06-03 DIAGNOSIS — Z993 Dependence on wheelchair: Secondary | ICD-10-CM | POA: Diagnosis not present

## 2019-06-03 DIAGNOSIS — F329 Major depressive disorder, single episode, unspecified: Secondary | ICD-10-CM | POA: Diagnosis not present

## 2019-06-03 DIAGNOSIS — R499 Unspecified voice and resonance disorder: Secondary | ICD-10-CM | POA: Diagnosis not present

## 2019-06-03 DIAGNOSIS — R471 Dysarthria and anarthria: Secondary | ICD-10-CM | POA: Diagnosis not present

## 2019-06-04 DIAGNOSIS — F329 Major depressive disorder, single episode, unspecified: Secondary | ICD-10-CM | POA: Diagnosis not present

## 2019-06-04 DIAGNOSIS — R471 Dysarthria and anarthria: Secondary | ICD-10-CM | POA: Diagnosis not present

## 2019-06-04 DIAGNOSIS — M159 Polyosteoarthritis, unspecified: Secondary | ICD-10-CM | POA: Diagnosis not present

## 2019-06-04 DIAGNOSIS — R499 Unspecified voice and resonance disorder: Secondary | ICD-10-CM | POA: Diagnosis not present

## 2019-06-04 DIAGNOSIS — G20 Parkinson's disease: Secondary | ICD-10-CM | POA: Diagnosis not present

## 2019-06-04 DIAGNOSIS — Z79891 Long term (current) use of opiate analgesic: Secondary | ICD-10-CM | POA: Diagnosis not present

## 2019-06-05 ENCOUNTER — Telehealth: Payer: Self-pay | Admitting: *Deleted

## 2019-06-05 NOTE — Telephone Encounter (Signed)
Sonel from [facility] calling for ST verbal orders as follows:  1 time(s) weekly for 1 week(s), then 2 time(s) weekly for 7 week(s)  You can leave verbal orders on confidential voicemail.  Christen Bame, CMA

## 2019-06-05 NOTE — Telephone Encounter (Signed)
Please call in those verbal orders  Thanks  [place]

## 2019-06-05 NOTE — Telephone Encounter (Signed)
Called Sonel at [facility] and gave verbal orders for speech therapy per Dr.Chambliss.  Helen Odom, [place]

## 2019-06-07 DIAGNOSIS — F329 Major depressive disorder, single episode, unspecified: Secondary | ICD-10-CM | POA: Diagnosis not present

## 2019-06-07 DIAGNOSIS — R499 Unspecified voice and resonance disorder: Secondary | ICD-10-CM | POA: Diagnosis not present

## 2019-06-07 DIAGNOSIS — Z79891 Long term (current) use of opiate analgesic: Secondary | ICD-10-CM | POA: Diagnosis not present

## 2019-06-07 DIAGNOSIS — G20 Parkinson's disease: Secondary | ICD-10-CM | POA: Diagnosis not present

## 2019-06-07 DIAGNOSIS — R471 Dysarthria and anarthria: Secondary | ICD-10-CM | POA: Diagnosis not present

## 2019-06-07 DIAGNOSIS — M159 Polyosteoarthritis, unspecified: Secondary | ICD-10-CM | POA: Diagnosis not present

## 2019-06-10 DIAGNOSIS — Z79891 Long term (current) use of opiate analgesic: Secondary | ICD-10-CM | POA: Diagnosis not present

## 2019-06-10 DIAGNOSIS — F329 Major depressive disorder, single episode, unspecified: Secondary | ICD-10-CM | POA: Diagnosis not present

## 2019-06-10 DIAGNOSIS — M159 Polyosteoarthritis, unspecified: Secondary | ICD-10-CM | POA: Diagnosis not present

## 2019-06-10 DIAGNOSIS — R499 Unspecified voice and resonance disorder: Secondary | ICD-10-CM | POA: Diagnosis not present

## 2019-06-10 DIAGNOSIS — R471 Dysarthria and anarthria: Secondary | ICD-10-CM | POA: Diagnosis not present

## 2019-06-10 DIAGNOSIS — G20 Parkinson's disease: Secondary | ICD-10-CM | POA: Diagnosis not present

## 2019-06-11 DIAGNOSIS — R471 Dysarthria and anarthria: Secondary | ICD-10-CM | POA: Diagnosis not present

## 2019-06-11 DIAGNOSIS — G20 Parkinson's disease: Secondary | ICD-10-CM | POA: Diagnosis not present

## 2019-06-11 DIAGNOSIS — R499 Unspecified voice and resonance disorder: Secondary | ICD-10-CM | POA: Diagnosis not present

## 2019-06-11 DIAGNOSIS — M159 Polyosteoarthritis, unspecified: Secondary | ICD-10-CM | POA: Diagnosis not present

## 2019-06-11 DIAGNOSIS — F329 Major depressive disorder, single episode, unspecified: Secondary | ICD-10-CM | POA: Diagnosis not present

## 2019-06-11 DIAGNOSIS — Z79891 Long term (current) use of opiate analgesic: Secondary | ICD-10-CM | POA: Diagnosis not present

## 2019-06-14 DIAGNOSIS — F329 Major depressive disorder, single episode, unspecified: Secondary | ICD-10-CM | POA: Diagnosis not present

## 2019-06-14 DIAGNOSIS — Z79891 Long term (current) use of opiate analgesic: Secondary | ICD-10-CM | POA: Diagnosis not present

## 2019-06-14 DIAGNOSIS — R499 Unspecified voice and resonance disorder: Secondary | ICD-10-CM | POA: Diagnosis not present

## 2019-06-14 DIAGNOSIS — G20 Parkinson's disease: Secondary | ICD-10-CM | POA: Diagnosis not present

## 2019-06-14 DIAGNOSIS — R471 Dysarthria and anarthria: Secondary | ICD-10-CM | POA: Diagnosis not present

## 2019-06-14 DIAGNOSIS — M159 Polyosteoarthritis, unspecified: Secondary | ICD-10-CM | POA: Diagnosis not present

## 2019-06-17 ENCOUNTER — Other Ambulatory Visit: Payer: Self-pay | Admitting: Family Medicine

## 2019-06-17 DIAGNOSIS — M79641 Pain in right hand: Secondary | ICD-10-CM

## 2019-06-17 DIAGNOSIS — Z79891 Long term (current) use of opiate analgesic: Secondary | ICD-10-CM | POA: Diagnosis not present

## 2019-06-17 DIAGNOSIS — G20 Parkinson's disease: Secondary | ICD-10-CM | POA: Diagnosis not present

## 2019-06-17 DIAGNOSIS — M47816 Spondylosis without myelopathy or radiculopathy, lumbar region: Secondary | ICD-10-CM

## 2019-06-17 DIAGNOSIS — F329 Major depressive disorder, single episode, unspecified: Secondary | ICD-10-CM | POA: Diagnosis not present

## 2019-06-17 DIAGNOSIS — M159 Polyosteoarthritis, unspecified: Secondary | ICD-10-CM

## 2019-06-17 DIAGNOSIS — R499 Unspecified voice and resonance disorder: Secondary | ICD-10-CM | POA: Diagnosis not present

## 2019-06-17 DIAGNOSIS — R471 Dysarthria and anarthria: Secondary | ICD-10-CM | POA: Diagnosis not present

## 2019-06-17 NOTE — Telephone Encounter (Signed)
Care giver is calling and would like to have refills on the patients Hydrocodone and Zanaflex to be called in. Pt does have a virtual visit tomorrow. jw

## 2019-06-18 ENCOUNTER — Other Ambulatory Visit: Payer: Self-pay

## 2019-06-18 ENCOUNTER — Telehealth (INDEPENDENT_AMBULATORY_CARE_PROVIDER_SITE_OTHER): Payer: Medicare Other | Admitting: Family Medicine

## 2019-06-18 ENCOUNTER — Telehealth: Payer: Self-pay

## 2019-06-18 DIAGNOSIS — R471 Dysarthria and anarthria: Secondary | ICD-10-CM | POA: Diagnosis not present

## 2019-06-18 DIAGNOSIS — M159 Polyosteoarthritis, unspecified: Secondary | ICD-10-CM

## 2019-06-18 DIAGNOSIS — M150 Primary generalized (osteo)arthritis: Secondary | ICD-10-CM

## 2019-06-18 DIAGNOSIS — F329 Major depressive disorder, single episode, unspecified: Secondary | ICD-10-CM | POA: Diagnosis not present

## 2019-06-18 DIAGNOSIS — R499 Unspecified voice and resonance disorder: Secondary | ICD-10-CM | POA: Diagnosis not present

## 2019-06-18 DIAGNOSIS — G20 Parkinson's disease: Secondary | ICD-10-CM | POA: Diagnosis not present

## 2019-06-18 DIAGNOSIS — F320 Major depressive disorder, single episode, mild: Secondary | ICD-10-CM

## 2019-06-18 DIAGNOSIS — Z79891 Long term (current) use of opiate analgesic: Secondary | ICD-10-CM | POA: Diagnosis not present

## 2019-06-18 NOTE — Telephone Encounter (Signed)
Sonal Nather is calling to tell us that pt's therapy is being put on hold. Niece is concerned about germ safety and wants to hold off on therapy at this time. Also, pt was on schedule for a video visit today. Did that take place?  Please call and let Sonal know either way but she will need a copy of the office note stating a video visit was completed. The note can be faxed to [phone]. sonal's phone number is [phone]. Ottis Stain, CMA

## 2019-06-19 MED ORDER — CITALOPRAM HYDROBROMIDE 10 MG PO TABS: 10.0000 mg | ORAL_TABLET | Freq: Every day | ORAL | 1 refills | Status: DC

## 2019-06-19 NOTE — Assessment & Plan Note (Signed)
Worsening and stopped her zoloft.  Will try citalopram.  To consider desipramine for pain control and perhaps to decrease salivation

## 2019-06-19 NOTE — Progress Notes (Signed)
[facility] Telemedicine Visit  Patient consented to have virtual visit. Method of visit: Telephone  Encounter participants: Patient: Helen Odom - located at home Provider: Lind Covert - located at office Others (if applicable): no  Chief Complaint: Pain  JOINT PAIN  Continues to have significant pain in all her joints especially hand and legs.  No focal joint swelling or redness.  Her mobility is very limited due to her Parkinsons  DEPRESSION She feels very down frequently. Relates this to her pain. Stopped zoloft 3 months ago.  She did not feel it was helping.  Is willing to try something else  Does not want to continue speech or Physical Therapy due to worry about Covid exposure   ROS: per HPI  Pertinent PMHx: Parkinsons  Exam:  Respiratory: voice is faint but no obvious resp distress  Assessment/Plan:  DJD (degenerative joint disease), multiple sites Worsening pain.  Will need to review her medication use and perhaps add adjuvant medications like gabapentin.  She is in a very difficult position given her parkinsons with poor overall prognosis   Depression Worsening and stopped her zoloft.  Will try citalopram.  To consider desipramine for pain control and perhaps to decrease salivation    Time spent during visit with patient: 12 minutes

## 2019-06-19 NOTE — Assessment & Plan Note (Signed)
Worsening pain.  Will need to review her medication use and perhaps add adjuvant medications like gabapentin.  She is in a very difficult position given her parkinsons with poor overall prognosis

## 2019-06-20 ENCOUNTER — Encounter: Payer: Self-pay | Admitting: Family Medicine

## 2019-06-20 MED ORDER — TIZANIDINE HCL 2 MG PO CAPS: 2.0000 mg | ORAL_CAPSULE | Freq: Three times a day (TID) | ORAL | 1 refills | Status: DC | PRN

## 2019-06-20 NOTE — Telephone Encounter (Signed)
Faxed notes from virtual appointment 06/18/2019 to [facility] at [phone] per Dr. Erin Hearing.  Ozella Almond, [place]

## 2019-06-20 NOTE — Telephone Encounter (Signed)
Please let her know the visit did take place and the visit note from Tuesday to [facility]  Thanks  [place]

## 2019-06-28 ENCOUNTER — Other Ambulatory Visit: Payer: Self-pay | Admitting: Family Medicine

## 2019-06-28 DIAGNOSIS — M159 Polyosteoarthritis, unspecified: Secondary | ICD-10-CM

## 2019-06-28 DIAGNOSIS — M79641 Pain in right hand: Secondary | ICD-10-CM

## 2019-06-28 DIAGNOSIS — M47816 Spondylosis without myelopathy or radiculopathy, lumbar region: Secondary | ICD-10-CM

## 2019-06-28 MED ORDER — HYDROCODONE-ACETAMINOPHEN 5-325 MG PO TABS: 1.0000 | ORAL_TABLET | Freq: Four times a day (QID) | ORAL | 0 refills | Status: DC | PRN

## 2019-06-28 NOTE — Progress Notes (Signed)
I am covering for Dr. Erin Hearing who is away from the office.  Received message from PCP asking I refill patient's hydrocodone since he was unable to rx it to pharmacy remotely Reviewed PMP which has appropriate findings. Will refill x1 month   Leeanne Rio, MD

## 2019-06-28 NOTE — Telephone Encounter (Signed)
Helen Odom is calling and would like to have pt's Hydrocodone refilled. She has enough for the weekend but will need prescription by Monday.

## 2019-07-08 ENCOUNTER — Other Ambulatory Visit: Payer: Self-pay | Admitting: Family Medicine

## 2019-07-08 DIAGNOSIS — F320 Major depressive disorder, single episode, mild: Secondary | ICD-10-CM

## 2019-07-08 MED ORDER — CITALOPRAM HYDROBROMIDE 10 MG PO TABS: 10.0000 mg | ORAL_TABLET | Freq: Every day | ORAL | 1 refills | Status: DC

## 2019-07-08 NOTE — Telephone Encounter (Signed)
Patient needs Citalopram refilled, to [facility] on [address].  She just has a few days left.

## 2019-07-30 ENCOUNTER — Other Ambulatory Visit: Payer: Self-pay | Admitting: Family Medicine

## 2019-07-30 ENCOUNTER — Telehealth: Payer: Self-pay | Admitting: *Deleted

## 2019-07-30 DIAGNOSIS — M159 Polyosteoarthritis, unspecified: Secondary | ICD-10-CM

## 2019-07-30 DIAGNOSIS — M79641 Pain in right hand: Secondary | ICD-10-CM

## 2019-07-30 DIAGNOSIS — M47816 Spondylosis without myelopathy or radiculopathy, lumbar region: Secondary | ICD-10-CM

## 2019-07-30 NOTE — Telephone Encounter (Signed)
Sonal calls to let PCP know that pt will be discharged temporarily from [facility] PT/OT.  Pt request to hold off due to covid and her concerns of people coming in and out of her house.  When pt is ready we can request care again. Christen Bame, CMA

## 2019-07-30 NOTE — Telephone Encounter (Signed)
Suanne Marker is calling and would like to have pt's hydrocodone refilled. She will be out of medication on Thursday 09/24

## 2019-07-31 MED ORDER — HYDROCODONE-ACETAMINOPHEN 5-325 MG PO TABS: 1.0000 | ORAL_TABLET | Freq: Four times a day (QID) | ORAL | 0 refills | Status: DC | PRN

## 2019-08-26 ENCOUNTER — Telehealth: Payer: Self-pay | Admitting: *Deleted

## 2019-08-26 DIAGNOSIS — M47816 Spondylosis without myelopathy or radiculopathy, lumbar region: Secondary | ICD-10-CM

## 2019-08-26 DIAGNOSIS — M159 Polyosteoarthritis, unspecified: Secondary | ICD-10-CM

## 2019-08-26 DIAGNOSIS — F320 Major depressive disorder, single episode, mild: Secondary | ICD-10-CM

## 2019-08-26 DIAGNOSIS — M79641 Pain in right hand: Secondary | ICD-10-CM

## 2019-08-26 MED ORDER — LORATADINE 10 MG PO TABS: 10.0000 mg | ORAL_TABLET | Freq: Every day | ORAL | 3 refills | Status: AC

## 2019-08-26 MED ORDER — ESOMEPRAZOLE MAGNESIUM 20 MG PO CPDR: 20.0000 mg | DELAYED_RELEASE_CAPSULE | Freq: Every day | ORAL | 3 refills | Status: DC

## 2019-08-26 MED ORDER — TROLAMINE SALICYLATE 10 % EX CREA: 1.0000 "application " | TOPICAL_CREAM | CUTANEOUS | 0 refills | Status: AC | PRN

## 2019-08-26 MED ORDER — TIZANIDINE HCL 2 MG PO CAPS: 2.0000 mg | ORAL_CAPSULE | Freq: Three times a day (TID) | ORAL | 1 refills | Status: DC | PRN

## 2019-08-26 MED ORDER — CITALOPRAM HYDROBROMIDE 10 MG PO TABS: 10.0000 mg | ORAL_TABLET | Freq: Every day | ORAL | 3 refills | Status: DC

## 2019-08-26 NOTE — Telephone Encounter (Signed)
90 day supply given for all medications except controlled substance as this can only be prescribed for 30 days.   Dorris Singh, MD  Family Medicine Teaching Service

## 2019-08-26 NOTE — Telephone Encounter (Signed)
Pts friend, Suanne Marker, calls to inform us that it will be cheaper for pt to get her meds in a 90 day supply according to her insurance.  She would like all possible meds sent in a a 90 day supply to [facility]. Christen Bame, CMA

## 2019-08-29 MED ORDER — CITALOPRAM HYDROBROMIDE 10 MG PO TABS: 20.0000 mg | ORAL_TABLET | Freq: Every day | ORAL | 3 refills | Status: DC

## 2019-08-29 NOTE — Addendum Note (Signed)
Addended by: Owens Shark, Emi Lymon on: 08/29/2019 03:18 PM   Modules accepted: Orders

## 2019-08-29 NOTE — Telephone Encounter (Signed)
Rhonda call back.  Pt is to be taking 2 celexa a day (see my chart message from 06/20/19).  They have been doing this but the last script that was sent in was only for once a day.   Can we send in celexa two times daily for a 90 day supply. Christen Bame, CMA

## 2019-08-29 NOTE — Telephone Encounter (Signed)
New Rx sent to [facility] for increased citalopram dose.   Dorris Singh, MD  Family Medicine Teaching Service

## 2019-09-02 ENCOUNTER — Other Ambulatory Visit: Payer: Self-pay | Admitting: Family Medicine

## 2019-09-02 DIAGNOSIS — M79641 Pain in right hand: Secondary | ICD-10-CM

## 2019-09-02 DIAGNOSIS — M47816 Spondylosis without myelopathy or radiculopathy, lumbar region: Secondary | ICD-10-CM

## 2019-09-02 DIAGNOSIS — M159 Polyosteoarthritis, unspecified: Secondary | ICD-10-CM

## 2019-09-02 MED ORDER — HYDROCODONE-ACETAMINOPHEN 5-325 MG PO TABS: 1.0000 | ORAL_TABLET | Freq: Four times a day (QID) | ORAL | 0 refills | Status: DC | PRN

## 2019-09-02 NOTE — Telephone Encounter (Signed)
Helen Odom is calling and would like to request refill for pt's hydrocodone. Pt will be completely out on 10/30.

## 2019-09-05 ENCOUNTER — Telehealth: Payer: Self-pay | Admitting: *Deleted

## 2019-09-05 MED ORDER — FLUCONAZOLE 150 MG PO TABS: 150.0000 mg | ORAL_TABLET | Freq: Once | ORAL | 0 refills | Status: AC

## 2019-09-05 NOTE — Telephone Encounter (Signed)
Faythe Dingwall calls because pt is in the middle of an "arthritis flare".  They are requesting to give hydrocodone every 4 hours instead of 6.    To PCP. Christen Bame, CMA

## 2019-09-05 NOTE — Telephone Encounter (Signed)
Spoke with patient and mainly care taker Having pain all over in joints and very achy No fever or shortness of breath or confusion Similar to episode a year ago  Probable flare of DJD Will treat with increased hydrocodone q 4 hours as needed for next 36 hours and continue all other medications  Call me if not better in 24 hours or is any other symptoms They agree

## 2019-09-20 ENCOUNTER — Encounter: Payer: Self-pay | Admitting: Family Medicine

## 2019-10-07 ENCOUNTER — Other Ambulatory Visit: Payer: Self-pay | Admitting: Family Medicine

## 2019-10-07 DIAGNOSIS — M159 Polyosteoarthritis, unspecified: Secondary | ICD-10-CM

## 2019-10-07 DIAGNOSIS — M79641 Pain in right hand: Secondary | ICD-10-CM

## 2019-10-07 DIAGNOSIS — M47816 Spondylosis without myelopathy or radiculopathy, lumbar region: Secondary | ICD-10-CM

## 2019-10-07 MED ORDER — HYDROCODONE-ACETAMINOPHEN 5-325 MG PO TABS: 1.0000 | ORAL_TABLET | Freq: Four times a day (QID) | ORAL | 0 refills | Status: DC | PRN

## 2019-10-07 NOTE — Telephone Encounter (Signed)
Patient needs refill on her hydrocodone sent to [facility] on [address]. Patient will be out on Thursday of this week. Please advise.

## 2019-11-05 ENCOUNTER — Other Ambulatory Visit: Payer: Self-pay

## 2019-11-05 MED ORDER — DICLOFENAC SODIUM 50 MG PO TBEC: 50.0000 mg | DELAYED_RELEASE_TABLET | Freq: Two times a day (BID) | ORAL | 0 refills | Status: AC

## 2019-11-11 ENCOUNTER — Other Ambulatory Visit: Payer: Self-pay | Admitting: Family Medicine

## 2019-11-11 DIAGNOSIS — M47816 Spondylosis without myelopathy or radiculopathy, lumbar region: Secondary | ICD-10-CM

## 2019-11-11 DIAGNOSIS — M79641 Pain in right hand: Secondary | ICD-10-CM

## 2019-11-11 DIAGNOSIS — M159 Polyosteoarthritis, unspecified: Secondary | ICD-10-CM

## 2019-11-11 MED ORDER — HYDROCODONE-ACETAMINOPHEN 5-325 MG PO TABS: 1.0000 | ORAL_TABLET | Freq: Four times a day (QID) | ORAL | 0 refills | Status: DC | PRN

## 2019-11-11 NOTE — Telephone Encounter (Signed)
Patient needs refill on hydrocodone sent to [facility] on [facility] - she will run out on Thursday of this week.

## 2019-11-21 ENCOUNTER — Encounter: Payer: Self-pay | Admitting: Family Medicine

## 2019-11-22 ENCOUNTER — Telehealth: Payer: Self-pay | Admitting: Family Medicine

## 2019-11-22 NOTE — Telephone Encounter (Signed)
Spoke with her and Suanne Marker caregiver  They feel things are stable.   Her Parkinsons is about the same as is her swallowing  Are taking a variety of analgesics to keep her DJD pain bearable  They do not feel they need any further assistance or change in therapy  Did recommend they increase celexa to 20 mg daily to help with mood and pain control  Asked them to call me if any changes or questions  They agree

## 2019-12-16 ENCOUNTER — Other Ambulatory Visit: Payer: Self-pay | Admitting: Family Medicine

## 2019-12-16 DIAGNOSIS — M159 Polyosteoarthritis, unspecified: Secondary | ICD-10-CM

## 2019-12-16 DIAGNOSIS — M79641 Pain in right hand: Secondary | ICD-10-CM

## 2019-12-16 DIAGNOSIS — M47816 Spondylosis without myelopathy or radiculopathy, lumbar region: Secondary | ICD-10-CM

## 2019-12-16 NOTE — Telephone Encounter (Signed)
Helen Odom is calling and would like to have pt's hydrocodone refilled. Pt will be completely out of medication on 12/19/19.

## 2019-12-17 ENCOUNTER — Ambulatory Visit: Payer: Self-pay | Admitting: Licensed Clinical Social Worker

## 2019-12-17 MED ORDER — HYDROCODONE-ACETAMINOPHEN 5-325 MG PO TABS: 1.0000 | ORAL_TABLET | Freq: Four times a day (QID) | ORAL | 0 refills | Status: DC | PRN

## 2019-12-17 NOTE — Chronic Care Management (AMB) (Signed)
  Social Work Care Management  Unsuccessful Phone Outreach   12/17/2019 Name: JAKAIYA RIGGLES MRN: [id number] DOB: [birth year]  Referred by: Lind Covert, MD,  Reason for referral :returning phone call  LONYA SCHRENK is a 86 y.o. year old female who sees Chambliss, Jeb Levering, MD for primary care.  LCSW received voice message from patient's care Suanne Marker that patient would like to speak to LCSW.   Returned call. Telephone outreach was unsuccessful. Unable to leave a phone message due to voicbox full. Plan: LCSW will call again in 1 to [place], [place] / [place]   [phone] 4:02 PM

## 2019-12-18 ENCOUNTER — Ambulatory Visit: Payer: Self-pay | Admitting: Licensed Clinical Social Worker

## 2019-12-18 NOTE — Chronic Care Management (AMB) (Signed)
    Clinical Social Work  Care Management Outreach  12/18/2019 Name: AQUILA BOGUSLAWSKI MRN: [id number] DOB: [birth year]  GRACIEMAE CAROTHERS is a 86 y.o. year old female who is a primary care patient of Chambliss, Jeb Levering, MD .   LCSW reached out to Gwenette Greet today by phone to return call from voice message left by her aide Suanne Marker. Per Ms. Strozier she and Suanne Marker wanted to speak with me, however Suanne Marker was not there today.  Follow Up Plan: Phone appointment schedule for 12/19/19 with LCSW  Casimer Lanius, [place] / [place]   [phone] 9:14 AM

## 2019-12-19 ENCOUNTER — Other Ambulatory Visit: Payer: Self-pay

## 2019-12-19 ENCOUNTER — Ambulatory Visit: Payer: Medicare Other | Admitting: Licensed Clinical Social Worker

## 2019-12-19 DIAGNOSIS — G20 Parkinson's disease: Secondary | ICD-10-CM

## 2019-12-19 DIAGNOSIS — Z7189 Other specified counseling: Secondary | ICD-10-CM

## 2019-12-19 DIAGNOSIS — F4329 Adjustment disorder with other symptoms: Secondary | ICD-10-CM

## 2019-12-19 NOTE — Patient Instructions (Signed)
Licensed Clinical Social Worker Visit Information Ms. Schall  it was nice speaking with you. Please call me directly if you have questions [phone] Goals we discussed today:  Goals Addressed            This Visit's Progress   . help with managing stressors       Current Barriers:  . Patient with Depression and Parkinson disease acknowledges deficits with managing stressors with aides  . Patient needs Support, Education, and Care Coordination in order to manager stress Clinical Social Work Goal(s):  Marland Kitchen Over the next 90 days, patient will work with SW  as needed  by telephone to manage symptoms of stress   . Patient will implement clinical interventions discussed today to decreases symptoms of stress and increase  ability of: coping skills and stress reduction. Interventions:  . Assessed patient's understanding, education, and care coordination needs  . Provided basic mental health support and interventions  . Other interventions include: Motivational Interviewing  Patient Self Care Activities & Deficits:  . Patient is unable to independently navigate community resource options without care coordination support . Patient is able to implement clinical interventions discussed today and is motivated for treatment  . Calls provider office for new concerns or questions Initial goal documentation     Materials provided:  Ms. Renton was given information about Care Management services today including:  1. Care Management services include personalized support from designated clinical staff supervised by her physician, including individualized plan of care and coordination with other care providers 2. 24/7 contact [phone] for assistance for urgent and routine care needs. 3. Care Management services at any time by phone call to the office staff.  Patient did not agree to enrollment in care management services .   The patient verbalized understanding of instructions provided  today  Follow up plan: Patient will contact LCSW as needed   Maurine Cane, LCSW

## 2019-12-19 NOTE — Chronic Care Management (AMB) (Signed)
  Social Work Care Management  Follow Up   12/19/2019 Name: Helen Odom MRN: [id number] DOB: May 05, 1931  Referred by: Lind Covert, MD Reason for referral : Care Coordination (support)  Helen Odom is a 86 y.o. year old female who is a primary care patient of Chambliss, Jeb Levering, MD.  Reason for follow-up: Phone encounter with patient today for ongoing assessment and brief interventions to assist with care coordination needs .   Intervention: SDOH (Social Determinants of Health) screening and general assessment of needs completed today.  Stress identified. See Care Plan for related entries.   Review of patient status, including review of consultants reports, relevant laboratory and other test results, and collaboration with appropriate care team members and the patient's provider was performed as part of comprehensive patient evaluation and provision of chronic care management services.   Outpatient Encounter Medications as of 12/19/2019  Medication Sig  . acetaminophen (TYLENOL) 325 MG tablet Take 2 tablets (650 mg total) by mouth every 6 (six) hours as needed for mild pain (or Fever >/= 101).  . citalopram (CELEXA) 10 MG tablet Take 2 tablets (20 mg total) by mouth daily.  . diclofenac (VOLTAREN) 50 MG EC tablet Take 1 tablet (50 mg total) by mouth 2 (two) times daily.  Marland Kitchen esomeprazole (NEXIUM) 20 MG capsule Take 1 capsule (20 mg total) by mouth daily at 12 noon.  Marland Kitchen HYDROcodone-acetaminophen (NORCO/VICODIN) 5-325 MG tablet Take 1 tablet by mouth every 6 (six) hours as needed for moderate pain.  Marland Kitchen loratadine (CLARITIN) 10 MG tablet Take 1 tablet (10 mg total) by mouth daily.  . tizanidine (ZANAFLEX) 2 MG capsule Take 1 capsule (2 mg total) by mouth 3 (three) times daily as needed for muscle spasms.  Marland Kitchen trolamine salicylate (ASPERCREME/ALOE) 10 % cream Apply 1 application topically as needed for muscle pain.   No facility-administered encounter medications on file as of  12/19/2019.   Goals Addressed            This Visit's Progress   . help with managing stressors       Current Barriers:  . Patient with Depression and Parkinson disease acknowledges deficits with managing stressors with aides  . Patient needs Support, Education, and Care Coordination in order to manager stress Clinical Social Work Goal(s):  Marland Kitchen Over the next 90 days, patient will work with SW  as needed  by telephone to manage symptoms of stress   . Patient will implement clinical interventions discussed today to decreases symptoms of stress and increase  ability of: coping skills and stress reduction. Interventions:  . Assessed patient's understanding, education, and care coordination needs  . Provided basic mental health support and interventions  . Other interventions include: Motivational Interviewing  Patient Self Care Activities & Deficits:  . Patient is unable to independently navigate community resource options without care coordination support . Patient is able to implement clinical interventions discussed today and is motivated for treatment  . Calls provider office for new concerns or questions Initial goal documentation     Plan: Patient will implement interventions discussed today and call LCSW as needed  Casimer Lanius, [place] / [place]   [phone] 1:31 PM

## 2019-12-30 ENCOUNTER — Encounter: Payer: Self-pay | Admitting: Family Medicine

## 2019-12-30 ENCOUNTER — Telehealth (INDEPENDENT_AMBULATORY_CARE_PROVIDER_SITE_OTHER): Payer: Medicare Other | Admitting: Student in an Organized Health Care Education/Training Program

## 2019-12-30 ENCOUNTER — Telehealth: Payer: Self-pay | Admitting: Family Medicine

## 2019-12-30 ENCOUNTER — Other Ambulatory Visit: Payer: Self-pay

## 2019-12-30 DIAGNOSIS — M79605 Pain in left leg: Secondary | ICD-10-CM | POA: Diagnosis not present

## 2019-12-30 DIAGNOSIS — M79604 Pain in right leg: Secondary | ICD-10-CM

## 2019-12-30 DIAGNOSIS — M79606 Pain in leg, unspecified: Secondary | ICD-10-CM | POA: Insufficient documentation

## 2019-12-30 MED ORDER — TIZANIDINE HCL 4 MG PO TABS: 4.0000 mg | ORAL_TABLET | Freq: Four times a day (QID) | ORAL | 0 refills | Status: AC | PRN

## 2019-12-30 MED ORDER — HYDROCODONE-ACETAMINOPHEN 10-325 MG PO TABS: 1.0000 | ORAL_TABLET | Freq: Four times a day (QID) | ORAL | 0 refills | Status: AC | PRN

## 2019-12-30 NOTE — Telephone Encounter (Signed)
Pt caregiver Suanne Marker is calling and would like to speak with Dr. Erin Hearing concerning pt's legs. She said they are very sore and swollen. She does not have a fever but is very uncomfortable. Pt does not want to come into office due to pain. Current medications are not touching the pain.   Please call Suanne Marker to discuss [phone]

## 2019-12-30 NOTE — Progress Notes (Signed)
[facility] Telemedicine Visit  Patient consented to have virtual visit. Method of visit: Video  Encounter participants: Patient: Helen Odom - located at home Provider: Richarda Osmond - located at [facility] Others (if applicable): deseree, caregiver  Chief Complaint: bilateral leg pain x1night  HPI:  Performed video visit with patient and her caregiver. Patient expresses that she chronically has bilateral leg pain but had acute worsening overnight which felt similar to her chronic pain but more severe.  The pain is constant from her hips to her heels bilaterally.  Denies any radiation or new weaknesses or numbness in her legs.  Described the pain as a rigidness in her legs which is chronic.  Caregiver denies any skin changes including increased temperature or redness.  Patient is afebrile.  Has chronic leg swelling with right worse than left and is unchanged currently.  Denies any incontinence of bowel or bladder. Patient has been alternating diclofenac and tizanidine as well as using hydrocodone without any improvement in her symptoms.  Her aide overnight says that she was up tossing and turning and unable to sleep due to the pain. Patient denies ever having something this severe before but then described a treatment prescribed by their PCP in the past for similar symptoms.  In the past he increased frequency of tizanidine and hydrocodone which did help.  ROS: per HPI  Pertinent PMHx: Parkinson disease with DJD.  Exam:  Respiratory: No respiratory dyspnea.  Appears comfortable at rest.  Assessment/Plan:  Leg pain Recommended to patient and caregiver that she come in for an appointment at the very least if not go to the emergency department for further work-up of this acute change of her chronic pain which seems to be unbearable to the point that she cannot sleep. Patient and caregiver declined this recommendation as a states very difficult for her to leave the  house. In the past, PCP has increased her pain medications and muscle relaxants for short course which has improved the pain and resolve the issue.  They would like to try this course of treatment at this time as well. Prescribed 3-day course of increased dose of Norco and tizanidine. Described to patient and care giver that she is not to take both doses of norco or tizanidine during this 3 day period.  They expressed understanding and agreement. If patient is not improved within the next 3 days of treatment would highly recommend presentation to emergency department for further work-up.  Given patient's relatively sedentary lifestyle at high risk for blood clots so have high suspicion for DVT.  This could also be sequelae of her chronic diseases having a flareup.    Time spent during visit with patient: 16 minutes

## 2019-12-30 NOTE — Assessment & Plan Note (Addendum)
Recommended to patient and caregiver that she come in for an appointment at the very least if not go to the emergency department for further work-up of this acute change of her chronic pain which seems to be unbearable to the point that she cannot sleep. Patient and caregiver declined this recommendation as a states very difficult for her to leave the house. In the past, PCP has increased her pain medications and muscle relaxants for short course which has improved the pain and resolve the issue.  They would like to try this course of treatment at this time as well. Prescribed 3-day course of increased dose of Norco and tizanidine. Described to patient and care giver that she is not to take both doses of norco or tizanidine during this 3 day period.  They expressed understanding and agreement. If patient is not improved within the next 3 days of treatment would highly recommend presentation to emergency department for further work-up.  Given patient's relatively sedentary lifestyle at high risk for blood clots so have high suspicion for DVT.  This could also be sequelae of her chronic diseases having a flareup.

## 2020-01-02 ENCOUNTER — Telehealth: Payer: Self-pay | Admitting: Family Medicine

## 2020-01-02 NOTE — Telephone Encounter (Signed)
Mailbox full could not leave message 

## 2020-01-02 NOTE — Telephone Encounter (Signed)
Patient calls nurse line returning PCP call. I advised her PCP sent a mychart message and I will read to her. Helen Odom has gone home Patient stated she is still taking Celexa 20mg  daily. Patient stated she ran out of #12 10-325mg  Vicodin today and Tizanidine #12 as well. Patient stated she has never tried Lyrica to her knowledge, she does remember her husband taking it though.

## 2020-01-02 NOTE — Telephone Encounter (Signed)
Sent My-Chart message

## 2020-01-02 NOTE — Telephone Encounter (Signed)
Pt caregiver is calling because she needs more of the medication that Dr. Ouida Sills called in on Monday, said it seems to work. Please call patients caregiver to advise. They say it has to be signed by Dr. Erin Hearing or insurance will not cover it. Thanks.

## 2020-01-03 ENCOUNTER — Telehealth: Payer: Self-pay | Admitting: Family Medicine

## 2020-01-03 MED ORDER — PREGABALIN 25 MG PO CAPS: 25.0000 mg | ORAL_CAPSULE | Freq: Two times a day (BID) | ORAL | 1 refills | Status: DC

## 2020-01-03 NOTE — Telephone Encounter (Signed)
Pain in legs is better but not back to normal  No redness or joint swelling or new edema or fever or shortness of breath Similar to prior episodes of intermittent pain  Increased hydrocodone and muscle relaxer helped but they want to get back to regular regimen.  Has never tried Lyrica  Will start at low dose.  Hopefully will help with pain and anxiety. May increase if tolerating but not effective

## 2020-01-07 ENCOUNTER — Ambulatory Visit: Payer: Self-pay | Admitting: Licensed Clinical Social Worker

## 2020-01-07 DIAGNOSIS — F439 Reaction to severe stress, unspecified: Secondary | ICD-10-CM

## 2020-01-07 DIAGNOSIS — G20 Parkinson's disease: Secondary | ICD-10-CM

## 2020-01-07 NOTE — Chronic Care Management (AMB) (Signed)
Care Management   Clinical Social Work Follow Up   01/07/2020 Name: Helen Odom MRN: [id number] DOB: [birth year]  Referred by: Lind Covert, MD  Reason for referral : Care Coordination (managing stress)  Helen Odom is a 86 y.o. year old female who is a primary care patient of Chambliss, Jeb Levering, MD.  Reason for follow-up: returned phone call to patient today for ongoing assessment and brief interventions to assist with managing stress  Assessment: Patient continues to experience stress with hiring the correct caregivers.   Review of patient status, including review of consultants reports, relevant laboratory and other test results, and collaboration with appropriate care team members and the patient's provider was performed as part of comprehensive patient evaluation and provision of care management services.    Advance Directive Status: N  Patient does not want to address at this encounter SDOH (Social Determinants of Health) assessments performed: Yes SDOH Interventions     Most Recent Value  SDOH Interventions  SDOH Interventions for the Following Domains  Stress  Stress Interventions  Provide Counseling      Goals Addressed            This Visit's Progress   . help with managing stressors       Current Barriers:  . Patient with Depression and Parkinson disease acknowledges deficits with managing stressors with aides  . Patient needs Support, Education, and Care Coordination in order to manager stress Clinical Social Work Goal(s):  Marland Kitchen Over the next 90 days, patient will work with SW  as needed  by telephone to manage symptoms of stress   . Patient will implement clinical interventions discussed today to decreases symptoms of stress and increase  ability of: coping skills and stress reduction. Interventions:  . Assessed patient's understanding, education, and care coordination needs  . Provided basic mental health support and interventions  . Other  interventions include: Motivational Interviewing and solution focused Patient Self Care Activities & Deficits:  . Patient is unable to independently navigate community resource options without care coordination support . Patient is able to implement clinical interventions discussed today and is motivated for treatment  . Calls provider office for new concerns or questions Please see past updates related to this goal by clicking on the "Past Updates" button in the selected goal        Outpatient Encounter Medications as of 01/07/2020  Medication Sig  . acetaminophen (TYLENOL) 325 MG tablet Take 2 tablets (650 mg total) by mouth every 6 (six) hours as needed for mild pain (or Fever >/= 101).  . citalopram (CELEXA) 10 MG tablet Take 2 tablets (20 mg total) by mouth daily.  . diclofenac (VOLTAREN) 50 MG EC tablet Take 1 tablet (50 mg total) by mouth 2 (two) times daily.  Marland Kitchen esomeprazole (NEXIUM) 20 MG capsule Take 1 capsule (20 mg total) by mouth daily at 12 noon.  Marland Kitchen HYDROcodone-acetaminophen (NORCO/VICODIN) 5-325 MG tablet Take 1 tablet by mouth every 6 (six) hours as needed for moderate pain.  Marland Kitchen loratadine (CLARITIN) 10 MG tablet Take 1 tablet (10 mg total) by mouth daily.  . pregabalin (LYRICA) 25 MG capsule Take 1 capsule (25 mg total) by mouth 2 (two) times daily.  . tizanidine (ZANAFLEX) 2 MG capsule Take 1 capsule (2 mg total) by mouth 3 (three) times daily as needed for muscle spasms.  Marland Kitchen trolamine salicylate (ASPERCREME/ALOE) 10 % cream Apply 1 application topically as needed for muscle pain.   No facility-administered encounter medications  on file as of 01/07/2020.   Plan: Patient will contact LCSW as needed.  She declined follow up call  Casimer Lanius, [place] / [place]   [phone] 9:55 AM

## 2020-01-07 NOTE — Patient Instructions (Signed)
Licensed Clinical Social Worker Visit Information Ms. Nastri  it was nice speaking with you. Please call me directly if you have questions [phone] Goals we discussed today:  Goals Addressed            This Visit's Progress   . help with managing stressors       Current Barriers:  . Patient with Depression and Parkinson disease acknowledges deficits with managing stressors with aides  . Patient needs Support, Education, and Care Coordination in order to manager stress Clinical Social Work Goal(s):  Marland Kitchen Over the next 90 days, patient will work with SW  as needed  by telephone to manage symptoms of stress   . Patient will implement clinical interventions discussed today to decreases symptoms of stress and increase  ability of: coping skills and stress reduction. Interventions:  . Assessed patient's understanding, education, and care coordination needs  . Provided basic mental health support and interventions  . Other interventions include: Motivational Interviewing and solution focused Patient Self Care Activities & Deficits:  . Patient is unable to independently navigate community resource options without care coordination support . Patient is able to implement clinical interventions discussed today and is motivated for treatment  . Calls provider office for new concerns or questions Please see past updates related to this goal by clicking on the "Past Updates" button in the selected goal        Materials provided: Verbal education about managing  provided by phone Ms. Defilippo received Care Management services today:  1. Care Management services include personalized support from designated clinical staff supervised by her physician, including individualized plan of care and coordination with other care providers 2. 24/7 contact [phone] for assistance for urgent and routine care needs. 3. Care Management are voluntary services and be declined at any time by calling the  office.  Patient  verbally agreed to assistance and services provided by embedded care coordination/care management team today.   Patient verbalizes understanding of instructions provided today.  Follow up plan: no follow up scheduled.   Maurine Cane, LCSW

## 2020-01-16 ENCOUNTER — Telehealth: Payer: Self-pay | Admitting: Family Medicine

## 2020-01-16 NOTE — Telephone Encounter (Signed)
Rhonda called for Carloyn Manner  [phone]. Requesting Dr. Erin Hearing to call regarding how pt. Is doing on medication. ph#  [phone]

## 2020-01-17 NOTE — Telephone Encounter (Signed)
Feels the Lyrica is helping both pain and mood.     No other problems  Continue current medications

## 2020-01-20 ENCOUNTER — Other Ambulatory Visit: Payer: Self-pay | Admitting: Family Medicine

## 2020-01-20 DIAGNOSIS — M47816 Spondylosis without myelopathy or radiculopathy, lumbar region: Secondary | ICD-10-CM

## 2020-01-20 DIAGNOSIS — M159 Polyosteoarthritis, unspecified: Secondary | ICD-10-CM

## 2020-01-20 DIAGNOSIS — M79641 Pain in right hand: Secondary | ICD-10-CM

## 2020-01-20 MED ORDER — PREGABALIN 25 MG PO CAPS: 25.0000 mg | ORAL_CAPSULE | Freq: Two times a day (BID) | ORAL | 1 refills | Status: DC

## 2020-01-20 MED ORDER — HYDROCODONE-ACETAMINOPHEN 5-325 MG PO TABS: 1.0000 | ORAL_TABLET | Freq: Four times a day (QID) | ORAL | 0 refills | Status: DC | PRN

## 2020-01-20 NOTE — Telephone Encounter (Signed)
Pt caregiver Suanne Marker is calling and would like to have pt's hydrocodone and lyrica refilled. She will be completely out of medications by Saturday 01/25/20.

## 2020-01-27 ENCOUNTER — Ambulatory Visit: Payer: Medicare Other | Attending: Internal Medicine

## 2020-01-27 DIAGNOSIS — Z23 Encounter for immunization: Secondary | ICD-10-CM

## 2020-01-27 NOTE — Progress Notes (Signed)
   Covid-19 Vaccination Clinic  Name:  Helen Odom    MRN: [id number] DOB: [birth year]  01/27/2020  [name] was observed post Covid-19 immunization for 15 minutes without incident. She was provided with Vaccine Information Sheet and instruction to access the V-Safe system.   [name] was instructed to call 911 with any severe reactions post vaccine: Marland Kitchen Difficulty breathing  . Swelling of face and throat  . A fast heartbeat  . A bad rash all over body  . Dizziness and weakness   Immunizations Administered    Name Date Dose VIS Date Route   Pfizer COVID-19 Vaccine 01/27/2020 11:07 AM 0.3 mL 10/18/2019 Intramuscular   Manufacturer: [place]   Lot: [id number]   [place]: [id number]

## 2020-02-24 ENCOUNTER — Ambulatory Visit: Payer: Medicare Other | Attending: Internal Medicine

## 2020-02-24 DIAGNOSIS — Z23 Encounter for immunization: Secondary | ICD-10-CM

## 2020-02-24 NOTE — Progress Notes (Signed)
   Covid-19 Vaccination Clinic  Name:  BIBIANNA SKONIECZNY    MRN: [id number] DOB: 03/02/1931  02/24/2020  Ms. Reckling was observed post Covid-19 immunization for 15 minutes without incident. She was provided with Vaccine Information Sheet and instruction to access the V-Safe system.   Ms. Blomgren was instructed to call 911 with any severe reactions post vaccine: Marland Kitchen Difficulty breathing  . Swelling of face and throat  . A fast heartbeat  . A bad rash all over body  . Dizziness and weakness   Immunizations Administered    Name Date Dose VIS Date Route   Pfizer COVID-19 Vaccine 02/24/2020  9:56 AM 0.3 mL 01/01/2019 Intramuscular   Manufacturer: [place]   Lot: [id number]   [place]: [id number]

## 2020-03-02 ENCOUNTER — Telehealth: Payer: Self-pay | Admitting: Family Medicine

## 2020-03-02 DIAGNOSIS — M79641 Pain in right hand: Secondary | ICD-10-CM

## 2020-03-02 DIAGNOSIS — M159 Polyosteoarthritis, unspecified: Secondary | ICD-10-CM

## 2020-03-02 DIAGNOSIS — M47816 Spondylosis without myelopathy or radiculopathy, lumbar region: Secondary | ICD-10-CM

## 2020-03-02 MED ORDER — HYDROCODONE-ACETAMINOPHEN 5-325 MG PO TABS: 1.0000 | ORAL_TABLET | Freq: Four times a day (QID) | ORAL | 0 refills | Status: DC | PRN

## 2020-03-02 NOTE — Telephone Encounter (Signed)
Patients caregiver is calling and would like to have patients hydrocodone refilled.

## 2020-03-04 ENCOUNTER — Other Ambulatory Visit: Payer: Self-pay | Admitting: *Deleted

## 2020-03-04 MED ORDER — PREGABALIN 25 MG PO CAPS: 25.0000 mg | ORAL_CAPSULE | Freq: Two times a day (BID) | ORAL | 1 refills | Status: DC

## 2020-03-04 NOTE — Telephone Encounter (Signed)
Pharmacy states that pt said she now takes one capsule 3 times daily. Please advise. Helen Odom Kennon Holter, CMA

## 2020-03-30 ENCOUNTER — Telehealth: Payer: Self-pay | Admitting: Family Medicine

## 2020-03-30 DIAGNOSIS — M79641 Pain in right hand: Secondary | ICD-10-CM

## 2020-03-30 DIAGNOSIS — M47816 Spondylosis without myelopathy or radiculopathy, lumbar region: Secondary | ICD-10-CM

## 2020-03-30 DIAGNOSIS — M159 Polyosteoarthritis, unspecified: Secondary | ICD-10-CM

## 2020-03-30 NOTE — Telephone Encounter (Signed)
Kizzie Fantasia is calling to put in refill request for patient for her Hydrocodone because she will be out Monday which is a Holiday. Thanks

## 2020-04-01 ENCOUNTER — Encounter: Payer: Self-pay | Admitting: Family Medicine

## 2020-04-01 MED ORDER — HYDROCODONE-ACETAMINOPHEN 5-325 MG PO TABS: 1.0000 | ORAL_TABLET | Freq: Four times a day (QID) | ORAL | 0 refills | Status: DC | PRN

## 2020-04-07 MED ORDER — TERBINAFINE HCL 1 % EX CREA: 1.0000 "application " | TOPICAL_CREAM | Freq: Two times a day (BID) | CUTANEOUS | 2 refills | Status: AC

## 2020-04-15 ENCOUNTER — Telehealth (INDEPENDENT_AMBULATORY_CARE_PROVIDER_SITE_OTHER): Payer: Medicare Other | Admitting: Family Medicine

## 2020-04-15 ENCOUNTER — Telehealth: Payer: Self-pay

## 2020-04-15 ENCOUNTER — Encounter: Payer: Self-pay | Admitting: Family Medicine

## 2020-04-15 DIAGNOSIS — R070 Pain in throat: Secondary | ICD-10-CM | POA: Diagnosis not present

## 2020-04-15 DIAGNOSIS — Z20818 Contact with and (suspected) exposure to other bacterial communicable diseases: Secondary | ICD-10-CM | POA: Diagnosis present

## 2020-04-15 DIAGNOSIS — G20 Parkinson's disease: Secondary | ICD-10-CM | POA: Diagnosis not present

## 2020-04-15 MED ORDER — CEPHALEXIN 500 MG PO CAPS: 500.0000 mg | ORAL_CAPSULE | Freq: Two times a day (BID) | ORAL | 0 refills | Status: AC

## 2020-04-15 MED ORDER — FLUTICASONE PROPIONATE 50 MCG/ACT NA SUSP
2.0000 | Freq: Every day | NASAL | 1 refills | Status: AC
Start: 2020-04-15 — End: ?

## 2020-04-15 NOTE — Telephone Encounter (Signed)
Helen Odom calls nurse line requesting verbal approval to dispense Keflex, as patient has a reported allergy to penicillin. Per Chambliss, ok to dispense. Patient has a low risk allergy, rash. Pharmacy was given verbal.

## 2020-04-15 NOTE — Progress Notes (Signed)
[facility] Telemedicine Visit  Patient consented to have virtual visit and was identified by name and date of birth. Method of visit: Telephone  Encounter participants: Patient: Helen Odom - located at Home Provider: Carollee Leitz - located at Office Others (if applicable): Ty(caregiver)  Chief Complaint: scratchy throat  HPI:  Patient reports having a sore scratchy throat since yesterday.  She reports having some difficulty swallowing but is able to stay hydrated.  Was recently in contact with 2 caregivers that had tested positive for Step Throat.  She reports 4/10 pain in throat.  Reports usual SOB but not worsening.  Denies any headaches, earaches or fevers.  Denies ay anosmia, ageusia, diarrhea.  Reports a runny nose with clear fluid that has had for a while. She endorses a chronic cough but past couple of days has been having some productive yellow mucus.  Caregiver reports an increase in fatigue over the past couple of days.  She is wheelchair bound and receives 24hr in home care.   ROS: per HPI  Pertinent PMHx:  Parkinson's Disease  Exam:  Temp 98 F (36.7 C) (Oral)   LMP 10/14/1988   Respiratory: No IWOB, SOB noted during phone call.  Was able to speak in full sentences without difficulty.  Assessment/Plan:  Exposure to group A Streptococcus Given recent exposure and symptoms of sore throat and rhinitis will cover with prophylaxis.   -Keflex 500 mg qid x 10 days, patient has received this medication previously. -Flonase 29mcg 2 sprays to each nares daily -Continue to hydrate -No Ibuprofen or Tylenol as patient already taking Voltaren and Vicodin -Strict return precautions  -Follow up with PCP as needed    Time spent during visit with patient: 20 minutes

## 2020-04-15 NOTE — Progress Notes (Signed)
err

## 2020-04-15 NOTE — Assessment & Plan Note (Addendum)
Given recent exposure and symptoms of sore throat and rhinitis will cover with prophylaxis.   -Keflex 500 mg qid x 10 days, patient has received this medication previously. -Flonase 12mcg 2 sprays to each nares daily -Continue to hydrate -No Ibuprofen or Tylenol as patient already taking Voltaren and Vicodin -Strict return precautions  -Follow up with PCP as needed

## 2020-04-15 NOTE — Patient Instructions (Signed)
It was a pleasure talking to you today.  You were seen for sore throat  I have sent in a prescription for Keflex and Flonase  If you have any worsening symptoms please call us or go to the Urgent Care  Carollee Leitz, MD Family Medicine Res[facility]  Strep Throat, Adult Strep throat is an infection of the throat. It is caused by germs (bacteria). Strep throat is common during the cold months of the year. It mostly affects children who are 61-84 years old. However, people of all ages can get it at any time of the year. When strep throat affects the tonsils, it is called tonsillitis. When it affects the back of the throat, it is called pharyngitis. This infection spreads from person to person through coughing, sneezing, or having close contact. What are the causes? This condition is caused by the Streptococcus pyogenes germ. What increases the risk? You are more likely to develop this condition if:  You care for young children. Children are more likely to get strep throat and may spread it to others.  You go to crowded places. Germs can spread easily in such places.  You kiss or touch someone who has strep throat. What are the signs or symptoms? Symptoms of this condition include:  Fever or chills.  Redness, swelling, or pain in the tonsils or throat.  Pain or trouble when swallowing.  White or yellow spots on the tonsils or throat.  Tender glands in the neck and under the jaw.  Bad breath.  Red rash all over the body. This is rare. How is this treated? This condition may be treated with:  Medicines that kill germs (antibiotics).  Medicines that treat pain or fever. These include: ? Ibuprofen or acetaminophen. ? Aspirin, only for patients who are over the age of 3. ? Throat lozenges. ? Throat sprays. Follow these instructions at home: Medicines   Take over-the-counter and prescription medicines only as told by your doctor.  Take your antibiotic medicine as told by your  doctor. Do not stop taking the antibiotic even if you start to feel better. Eating and drinking   If you have trouble swallowing, eat soft foods until your throat feels better.  Drink enough fluid to keep your pee (urine) pale yellow.  To help with pain, you may have: ? Warm fluids, such as soup and tea. ? Cold fluids, such as frozen desserts or popsicles. General instructions  Rinse your mouth (gargle) with a salt-water mixture 3-4 times a day or as needed. To make a salt-water mixture, dissolve -1 tsp (3-6 g) of salt in 1 cup (237 mL) of warm water.  Rest as much as you can.  Stay home from work or school until you have been taking antibiotics for 24 hours.  Avoid smoking or being around people who smoke.  Keep all follow-up visits as told by your doctor. This is important. How is this prevented?   Do not share food, drinking cups, or personal items. They can cause the germs to spread.  Wash your hands well with soap and water. Make sure that all people in your house wash their hands well.  Have family members tested if they have a fever or a sore throat. They may need an antibiotic if they have strep throat. Contact a doctor if:  You have swelling in your neck that keeps getting bigger.  You get a rash, cough, or earache.  You cough up a thick fluid that is green, yellow-brown, or bloody.  You have pain that does not get better with medicine.  Your symptoms get worse instead of getting better.  You have a fever. Get help right away if:  You vomit.  You have a very bad headache.  Your neck hurts or feels stiff.  You have chest pain or are short of breath.  You have drooling, very bad throat pain, or changes in your voice.  Your neck is swollen, or the skin gets red and tender.  Your mouth is dry, or you are peeing less than normal.  You keep feeling more tired or have trouble waking up.  Your joints are red or painful. Summary  Strep throat is an  infection of the throat. It is caused by germs (bacteria).  This infection can spread from person to person through coughing, sneezing, or having close contact.  Take your medicines, including antibiotics, as told by your doctor. Do not stop taking the antibiotic even if you start to feel better.  To prevent the spread of germs, wash your hands well with soap and water. Have others do the same. Do not share food, drinking cups, or personal items.  Get help right away if you have a bad headache, chest pain, shortness of breath, a stiff or painful neck, or you vomit. This information is not intended to replace advice given to you by your health care provider. Make sure you discuss any questions you have with your health care provider. Document Revised: 01/11/2019 Document Reviewed: 01/11/2019 Elsevier Patient Education  [place].

## 2020-04-27 ENCOUNTER — Telehealth: Payer: Self-pay | Admitting: Family Medicine

## 2020-04-27 NOTE — Telephone Encounter (Signed)
Helen Odom wants to know if Dr. Erin Hearing could call [facility] and discuss what she can do for her hand, possible rehab or something. Also she is needing a refill on her Lyrica. Thanks

## 2020-04-28 ENCOUNTER — Encounter: Payer: Self-pay | Admitting: Family Medicine

## 2020-04-28 MED ORDER — PREGABALIN 25 MG PO CAPS: 25.0000 mg | ORAL_CAPSULE | Freq: Two times a day (BID) | ORAL | 3 refills | Status: DC

## 2020-04-28 NOTE — Telephone Encounter (Signed)
Care giver is calling to see if the doctor had a chance to call in the [place]. Please call with an update jw

## 2020-04-28 NOTE — Telephone Encounter (Signed)
See today's My Chart message.

## 2020-05-07 ENCOUNTER — Other Ambulatory Visit: Payer: Self-pay | Admitting: *Deleted

## 2020-05-07 DIAGNOSIS — M79641 Pain in right hand: Secondary | ICD-10-CM

## 2020-05-07 DIAGNOSIS — M159 Polyosteoarthritis, unspecified: Secondary | ICD-10-CM

## 2020-05-07 DIAGNOSIS — M47816 Spondylosis without myelopathy or radiculopathy, lumbar region: Secondary | ICD-10-CM

## 2020-05-07 MED ORDER — HYDROCODONE-ACETAMINOPHEN 5-325 MG PO TABS: 1.0000 | ORAL_TABLET | Freq: Four times a day (QID) | ORAL | 0 refills | Status: DC | PRN

## 2020-05-19 ENCOUNTER — Ambulatory Visit: Payer: Medicare Other | Admitting: Licensed Clinical Social Worker

## 2020-05-19 ENCOUNTER — Other Ambulatory Visit: Payer: Self-pay

## 2020-05-19 ENCOUNTER — Ambulatory Visit (INDEPENDENT_AMBULATORY_CARE_PROVIDER_SITE_OTHER): Payer: Medicare Other | Admitting: Family Medicine

## 2020-05-19 VITALS — BP 140/82 | HR 63

## 2020-05-19 DIAGNOSIS — F439 Reaction to severe stress, unspecified: Secondary | ICD-10-CM

## 2020-05-19 DIAGNOSIS — M24541 Contracture, right hand: Secondary | ICD-10-CM

## 2020-05-19 DIAGNOSIS — F320 Major depressive disorder, single episode, mild: Secondary | ICD-10-CM | POA: Diagnosis not present

## 2020-05-19 DIAGNOSIS — K117 Disturbances of salivary secretion: Secondary | ICD-10-CM | POA: Diagnosis not present

## 2020-05-19 DIAGNOSIS — R600 Localized edema: Secondary | ICD-10-CM

## 2020-05-19 DIAGNOSIS — G20 Parkinson's disease: Secondary | ICD-10-CM | POA: Diagnosis not present

## 2020-05-19 DIAGNOSIS — M159 Polyosteoarthritis, unspecified: Secondary | ICD-10-CM

## 2020-05-19 DIAGNOSIS — M8949 Other hypertrophic osteoarthropathy, multiple sites: Secondary | ICD-10-CM | POA: Diagnosis not present

## 2020-05-19 MED ORDER — CITALOPRAM HYDROBROMIDE 20 MG PO TABS: 20.0000 mg | ORAL_TABLET | Freq: Every day | ORAL | 1 refills | Status: DC

## 2020-05-19 NOTE — Progress Notes (Signed)
    SUBJECTIVE:   CHIEF COMPLAINT / HPI:   Here for follow up.  Last seen a year ago due to Covid  PARKINSON'S Slowly worsening.  Mild increase in swallowing when takes in mixed foods.   Is not interested in seeing Neurology or trying other medications. Having increased salivation   ANXIETY Seems to happen when confronted with different staff from her 24/7 home care.  Taking celexa 20 mg - two 10 mg tabs but these are hard for her to manipulate since so small.  Most of the time is in good spiritis  HAND CONTRACTURE Severe and progressing in R hand.  Now with starting in Left.    EDEMA Comes and goes in her feet especially when dependent.  No particular shortness of breath   DJD PAIN Often severe with movement limitation from parkinsons.  Takes hydrocodone every 6 hours and lyrica every 12 and as needed diclofenac, tizantadine and tylenol and aspercreme  PERTINENT  PMH / PSH: Has 24/7 caretakers and is getting out once a week   OBJECTIVE:   BP 140/82   Pulse 63   LMP 10/14/1988   SpO2 95%   Good spirits slumped to R in her WC R hand can move thumb and index all other fingers contracted  L hand FROM but with catching/sticking of grip  Heart - Regular rate and rhythm.  No murmurs, gallops or rubs.    Lungs:  Normal respiratory effort, chest expands symmetrically. Lungs are clear to auscultation, no crackles or wheezes. No pedal edema   ASSESSMENT/PLAN:   DJD (degenerative joint disease), multiple sites Mild worsening.  Discussed using sedating analgesics as needed.   Will ask Physical Therapy and OT to see  Parkinson disease ([place]) Worsening with hypersalivation bothering her more.  Will investigate treatments   Depression Stable with worsening anxiety irritability.  Will see how Physical Therapy and OT helps.  May consider adding buspar   Contracture of joint of right hand Order home Physical Therapy and OT   Hypersalivation As a complication of Parkinsonism.  Will  try glycopyrrolate 1 mg three times a day      Lind Covert, MD [place]

## 2020-05-19 NOTE — Assessment & Plan Note (Signed)
Stable with worsening anxiety irritability.  Will see how Physical Therapy and OT helps.  May consider adding buspar

## 2020-05-19 NOTE — Patient Instructions (Signed)
Great to see you  I will call you if your tests are not good.  Otherwise I will send you a letter.  If you do not hear from me with in 2 weeks please call our office.     I will order Physical Therapy and OT   I will look into treatments for saliva  Try to stay up as much as possible in the day time  We will touch base in a few weeks to see how the anxiety is doing

## 2020-05-19 NOTE — Assessment & Plan Note (Signed)
Mild worsening.  Discussed using sedating analgesics as needed.   Will ask Physical Therapy and OT to see

## 2020-05-19 NOTE — Assessment & Plan Note (Signed)
Order home Physical Therapy and OT

## 2020-05-19 NOTE — Assessment & Plan Note (Signed)
Worsening with hypersalivation bothering her more.  Will investigate treatments

## 2020-05-19 NOTE — Chronic Care Management (AMB) (Signed)
Care Management   Clinical Social Work Follow Up   05/19/2020 Name: Helen Odom MRN: [id number] DOB: 1931/04/19 Referred by: Lind Covert, MD  Reason for referral : Care Coordination (stress)  Helen Odom is a 86 y.o. year old female who is a primary care patient of Chambliss, Jeb Levering, MD.  Reason for follow-up: assess for barriers and progress with managing stressors with multi caregivers .   Assessment: Patient continues to experience difficulty with maintaining caregivers to meet her needs which causes a level of stress and frustration for her. Patient wants to remain in her home and is not interesting in going to a facility. See care plan below for interventions. Plan:  1. Patient will implement information discussed today 2.   Will call LCSW as needs for ongoing support and interventions Advance Directive Status:yes;  SDOH (Social Determinants of Health) assessments performed ; No needs identified   Goals Addressed            This Visit's Progress   . help with managing stressors       CARE PLAN ENTRY (see longitudinal plan of care for additional care plan information)  Current Barriers:  . Patient with Depression and Parkinson disease acknowledges deficits with managing stressors with aides  . Patient needs Support, Education, and Care Coordination in order to manager stress . Patient continues to have difficulty with managing stress related to having multi caregivers Clinical Social Work Goal(s):  Marland Kitchen Over the next 90 days, patient will work with SW  as needed  by telephone to manage symptoms of stress   . Patient will implement clinical interventions discussed today to decreases symptoms of stress and increase  ability of: coping skills and stress reduction. Interventions:  . Assessed patient's understanding, education, and care coordination needs . Reviewed interventions and solutions that have worked in the past with managing stress . Provided basic  mental health support and interventions  . Most of today's visit with spend on Motivational Interviewing and solution focused strategies . Reviewed possible education information however patient has all information on Parkinson's Association  Patient Self Care Activities & Deficits:  . Patient is unable to independently navigate community resource options without care coordination support . Patient is able to implement clinical interventions discussed today and is motivated for treatment  Please see past updates related to this goal by clicking on the "Past Updates" button in the selected goal        Outpatient Encounter Medications as of 05/19/2020  Medication Sig  . acetaminophen (TYLENOL) 325 MG tablet Take 2 tablets (650 mg total) by mouth every 6 (six) hours as needed for mild pain (or Fever >/= 101). (Patient not taking: Reported on 05/19/2020)  . citalopram (CELEXA) 20 MG tablet Take 1 tablet (20 mg total) by mouth daily.  . diclofenac (VOLTAREN) 50 MG EC tablet Take 1 tablet (50 mg total) by mouth 2 (two) times daily.  Marland Kitchen esomeprazole (NEXIUM) 20 MG capsule Take 1 capsule (20 mg total) by mouth daily at 12 noon.  . fluticasone (FLONASE) 50 MCG/ACT nasal spray Place 2 sprays into both nostrils daily.  Marland Kitchen HYDROcodone-acetaminophen (NORCO/VICODIN) 5-325 MG tablet Take 1 tablet by mouth every 6 (six) hours as needed for moderate pain.  Marland Kitchen loratadine (CLARITIN) 10 MG tablet Take 1 tablet (10 mg total) by mouth daily.  . pregabalin (LYRICA) 25 MG capsule Take 1 capsule (25 mg total) by mouth 2 (two) times daily.  Marland Kitchen terbinafine (LAMISIL AT) 1 %  cream Apply 1 application topically 2 (two) times daily.  . tizanidine (ZANAFLEX) 2 MG capsule Take 1 capsule (2 mg total) by mouth 3 (three) times daily as needed for muscle spasms.  Marland Kitchen trolamine salicylate (ASPERCREME/ALOE) 10 % cream Apply 1 application topically as needed for muscle pain.   No facility-administered encounter medications on file as of  05/19/2020.   Review of patient status, including review of consultants reports, relevant laboratory and other test results, and collaboration with appropriate care team members and the patient's provider was performed as part of comprehensive patient evaluation and provision of care management services.    Casimer Lanius, [place] / [place]   [phone] 2:24 PM

## 2020-05-20 LAB — CBC
Hematocrit: 37.3 % (ref 34.0–46.6)
Hemoglobin: 11.8 g/dL (ref 11.1–15.9)
MCH: 29.9 pg (ref 26.6–33.0)
MCHC: 31.6 g/dL (ref 31.5–35.7)
MCV: 95 fL (ref 79–97)
Platelets: 267 10*3/uL (ref 150–450)
RBC: 3.94 x10E6/uL (ref 3.77–5.28)
RDW: 13.2 % (ref 11.7–15.4)
WBC: 7.8 10*3/uL (ref 3.4–10.8)

## 2020-05-20 LAB — CMP14+EGFR
ALT: 6 IU/L (ref 0–32)
AST: 9 IU/L (ref 0–40)
Albumin/Globulin Ratio: 1.5 (ref 1.2–2.2)
Albumin: 3.8 g/dL (ref 3.6–4.6)
Alkaline Phosphatase: 68 IU/L (ref 48–121)
BUN/Creatinine Ratio: 24 (ref 12–28)
BUN: 16 mg/dL (ref 8–27)
Bilirubin Total: 0.2 mg/dL (ref 0.0–1.2)
CO2: 26 mmol/L (ref 20–29)
Calcium: 9.2 mg/dL (ref 8.7–10.3)
Chloride: 102 mmol/L (ref 96–106)
Creatinine, Ser: 0.67 mg/dL (ref 0.57–1.00)
GFR calc Af Amer: 92 mL/min/{1.73_m2} (ref >59)
GFR calc non Af Amer: 80 mL/min/{1.73_m2} (ref >59)
Globulin, Total: 2.6 g/dL (ref 1.5–4.5)
Glucose: 78 mg/dL (ref 65–99)
Potassium: 4 mmol/L (ref 3.5–5.2)
Sodium: 141 mmol/L (ref 134–144)
Total Protein: 6.4 g/dL (ref 6.0–8.5)

## 2020-05-20 LAB — TSH: TSH: 1.25 u[IU]/mL (ref 0.450–4.500)

## 2020-05-21 ENCOUNTER — Other Ambulatory Visit: Payer: Self-pay | Admitting: Family Medicine

## 2020-05-21 DIAGNOSIS — K117 Disturbances of salivary secretion: Secondary | ICD-10-CM | POA: Insufficient documentation

## 2020-05-21 MED ORDER — GLYCOPYRROLATE 1 MG PO TABS: 1.0000 mg | ORAL_TABLET | Freq: Three times a day (TID) | ORAL | 1 refills | Status: DC

## 2020-05-21 NOTE — Assessment & Plan Note (Signed)
As a complication of Parkinsonism.  Will try glycopyrrolate 1 mg three times a day

## 2020-05-22 DIAGNOSIS — M24541 Contracture, right hand: Secondary | ICD-10-CM | POA: Diagnosis not present

## 2020-05-22 DIAGNOSIS — F419 Anxiety disorder, unspecified: Secondary | ICD-10-CM | POA: Diagnosis not present

## 2020-05-22 DIAGNOSIS — M159 Polyosteoarthritis, unspecified: Secondary | ICD-10-CM | POA: Diagnosis not present

## 2020-05-22 DIAGNOSIS — K117 Disturbances of salivary secretion: Secondary | ICD-10-CM | POA: Diagnosis not present

## 2020-05-22 DIAGNOSIS — G20 Parkinson's disease: Secondary | ICD-10-CM | POA: Diagnosis not present

## 2020-05-22 DIAGNOSIS — F329 Major depressive disorder, single episode, unspecified: Secondary | ICD-10-CM | POA: Diagnosis not present

## 2020-05-22 DIAGNOSIS — M810 Age-related osteoporosis without current pathological fracture: Secondary | ICD-10-CM | POA: Diagnosis not present

## 2020-05-22 DIAGNOSIS — M47816 Spondylosis without myelopathy or radiculopathy, lumbar region: Secondary | ICD-10-CM | POA: Diagnosis not present

## 2020-05-25 DIAGNOSIS — F419 Anxiety disorder, unspecified: Secondary | ICD-10-CM | POA: Diagnosis not present

## 2020-05-25 DIAGNOSIS — M24541 Contracture, right hand: Secondary | ICD-10-CM | POA: Diagnosis not present

## 2020-05-25 DIAGNOSIS — M47816 Spondylosis without myelopathy or radiculopathy, lumbar region: Secondary | ICD-10-CM | POA: Diagnosis not present

## 2020-05-25 DIAGNOSIS — F329 Major depressive disorder, single episode, unspecified: Secondary | ICD-10-CM | POA: Diagnosis not present

## 2020-05-25 DIAGNOSIS — M159 Polyosteoarthritis, unspecified: Secondary | ICD-10-CM | POA: Diagnosis not present

## 2020-05-25 DIAGNOSIS — G20 Parkinson's disease: Secondary | ICD-10-CM | POA: Diagnosis not present

## 2020-05-26 ENCOUNTER — Telehealth: Payer: Self-pay

## 2020-05-26 NOTE — Telephone Encounter (Signed)
Joelene Millin from [facility] calling for OT verbal orders as follows:  1 time(s) weekly for 2 week(s), then 2 time(s) weekly for 3 week(s), then 1 time (s) weekly for 1 week.   Verbal orders given per [facility] protocol  Talbot Grumbling, RN

## 2020-05-27 DIAGNOSIS — M159 Polyosteoarthritis, unspecified: Secondary | ICD-10-CM | POA: Diagnosis not present

## 2020-05-27 DIAGNOSIS — M24541 Contracture, right hand: Secondary | ICD-10-CM | POA: Diagnosis not present

## 2020-05-27 DIAGNOSIS — M47816 Spondylosis without myelopathy or radiculopathy, lumbar region: Secondary | ICD-10-CM | POA: Diagnosis not present

## 2020-05-27 DIAGNOSIS — G20 Parkinson's disease: Secondary | ICD-10-CM | POA: Diagnosis not present

## 2020-05-27 DIAGNOSIS — F329 Major depressive disorder, single episode, unspecified: Secondary | ICD-10-CM | POA: Diagnosis not present

## 2020-05-27 DIAGNOSIS — F419 Anxiety disorder, unspecified: Secondary | ICD-10-CM | POA: Diagnosis not present

## 2020-05-29 DIAGNOSIS — M24541 Contracture, right hand: Secondary | ICD-10-CM | POA: Diagnosis not present

## 2020-05-29 DIAGNOSIS — F419 Anxiety disorder, unspecified: Secondary | ICD-10-CM | POA: Diagnosis not present

## 2020-05-29 DIAGNOSIS — M159 Polyosteoarthritis, unspecified: Secondary | ICD-10-CM | POA: Diagnosis not present

## 2020-05-29 DIAGNOSIS — M47816 Spondylosis without myelopathy or radiculopathy, lumbar region: Secondary | ICD-10-CM | POA: Diagnosis not present

## 2020-05-29 DIAGNOSIS — F329 Major depressive disorder, single episode, unspecified: Secondary | ICD-10-CM | POA: Diagnosis not present

## 2020-05-29 DIAGNOSIS — G20 Parkinson's disease: Secondary | ICD-10-CM | POA: Diagnosis not present

## 2020-06-01 ENCOUNTER — Encounter: Payer: Self-pay | Admitting: Family Medicine

## 2020-06-01 ENCOUNTER — Telehealth: Payer: Self-pay | Admitting: *Deleted

## 2020-06-01 NOTE — Telephone Encounter (Signed)
Suanne Marker calls because per Patient, taking the glycopyrrolate 3 times a day was making her "dry and constipated".  This was also the case with 2 times a day.  Pt states that now she is taking it one time a day which works, but only last about 10 hours.  She wants to know what other steps they may take.  Christen Bame, CMA

## 2020-06-01 NOTE — Telephone Encounter (Signed)
See My chart note I sent today

## 2020-06-02 DIAGNOSIS — M159 Polyosteoarthritis, unspecified: Secondary | ICD-10-CM | POA: Diagnosis not present

## 2020-06-02 DIAGNOSIS — G20 Parkinson's disease: Secondary | ICD-10-CM | POA: Diagnosis not present

## 2020-06-02 DIAGNOSIS — F419 Anxiety disorder, unspecified: Secondary | ICD-10-CM | POA: Diagnosis not present

## 2020-06-02 DIAGNOSIS — M47816 Spondylosis without myelopathy or radiculopathy, lumbar region: Secondary | ICD-10-CM | POA: Diagnosis not present

## 2020-06-02 DIAGNOSIS — F329 Major depressive disorder, single episode, unspecified: Secondary | ICD-10-CM | POA: Diagnosis not present

## 2020-06-02 DIAGNOSIS — M24541 Contracture, right hand: Secondary | ICD-10-CM | POA: Diagnosis not present

## 2020-06-10 MED ORDER — GLYCOPYRROLATE 1 MG/5ML PO SOLN: 0.5000 mg | Freq: Two times a day (BID) | ORAL | 1 refills | Status: AC | PRN

## 2020-06-11 ENCOUNTER — Other Ambulatory Visit: Payer: Self-pay

## 2020-06-11 DIAGNOSIS — M79641 Pain in right hand: Secondary | ICD-10-CM

## 2020-06-11 DIAGNOSIS — M47816 Spondylosis without myelopathy or radiculopathy, lumbar region: Secondary | ICD-10-CM | POA: Diagnosis not present

## 2020-06-11 DIAGNOSIS — F419 Anxiety disorder, unspecified: Secondary | ICD-10-CM | POA: Diagnosis not present

## 2020-06-11 DIAGNOSIS — G20 Parkinson's disease: Secondary | ICD-10-CM | POA: Diagnosis not present

## 2020-06-11 DIAGNOSIS — F329 Major depressive disorder, single episode, unspecified: Secondary | ICD-10-CM | POA: Diagnosis not present

## 2020-06-11 DIAGNOSIS — M159 Polyosteoarthritis, unspecified: Secondary | ICD-10-CM

## 2020-06-11 DIAGNOSIS — M24541 Contracture, right hand: Secondary | ICD-10-CM | POA: Diagnosis not present

## 2020-06-12 MED ORDER — HYDROCODONE-ACETAMINOPHEN 5-325 MG PO TABS: 1.0000 | ORAL_TABLET | Freq: Four times a day (QID) | ORAL | 0 refills | Status: DC | PRN

## 2020-06-16 DIAGNOSIS — G20 Parkinson's disease: Secondary | ICD-10-CM | POA: Diagnosis not present

## 2020-06-16 DIAGNOSIS — M47816 Spondylosis without myelopathy or radiculopathy, lumbar region: Secondary | ICD-10-CM | POA: Diagnosis not present

## 2020-06-16 DIAGNOSIS — M159 Polyosteoarthritis, unspecified: Secondary | ICD-10-CM | POA: Diagnosis not present

## 2020-06-16 DIAGNOSIS — F419 Anxiety disorder, unspecified: Secondary | ICD-10-CM | POA: Diagnosis not present

## 2020-06-16 DIAGNOSIS — F329 Major depressive disorder, single episode, unspecified: Secondary | ICD-10-CM | POA: Diagnosis not present

## 2020-06-16 DIAGNOSIS — M24541 Contracture, right hand: Secondary | ICD-10-CM | POA: Diagnosis not present

## 2020-06-17 ENCOUNTER — Telehealth: Payer: Self-pay | Admitting: *Deleted

## 2020-06-17 NOTE — Telephone Encounter (Signed)
Shanon Brow calls from [facility] and needs a Verbal order to discontinue Physical therapy at patients request.  She feels that OT is helpful but not PT.  Please call him with orders if the PCP is agreeable.  Christen Bame, CMA

## 2020-06-18 DIAGNOSIS — G20 Parkinson's disease: Secondary | ICD-10-CM | POA: Diagnosis not present

## 2020-06-18 DIAGNOSIS — F329 Major depressive disorder, single episode, unspecified: Secondary | ICD-10-CM | POA: Diagnosis not present

## 2020-06-18 DIAGNOSIS — M47816 Spondylosis without myelopathy or radiculopathy, lumbar region: Secondary | ICD-10-CM | POA: Diagnosis not present

## 2020-06-18 DIAGNOSIS — M159 Polyosteoarthritis, unspecified: Secondary | ICD-10-CM | POA: Diagnosis not present

## 2020-06-18 DIAGNOSIS — M24541 Contracture, right hand: Secondary | ICD-10-CM | POA: Diagnosis not present

## 2020-06-18 DIAGNOSIS — F419 Anxiety disorder, unspecified: Secondary | ICD-10-CM | POA: Diagnosis not present

## 2020-06-18 NOTE — Telephone Encounter (Signed)
Please call and stop Physical Therapy Thanks  [place]

## 2020-06-19 NOTE — Telephone Encounter (Signed)
Called and spoke to Shanon Brow from [facility] to stop Physical Therapy per Dr. Erin Hearing.  Ozella Almond, [place]

## 2020-06-21 DIAGNOSIS — M24541 Contracture, right hand: Secondary | ICD-10-CM | POA: Diagnosis not present

## 2020-06-21 DIAGNOSIS — M810 Age-related osteoporosis without current pathological fracture: Secondary | ICD-10-CM | POA: Diagnosis not present

## 2020-06-21 DIAGNOSIS — G20 Parkinson's disease: Secondary | ICD-10-CM | POA: Diagnosis not present

## 2020-06-21 DIAGNOSIS — K117 Disturbances of salivary secretion: Secondary | ICD-10-CM | POA: Diagnosis not present

## 2020-06-21 DIAGNOSIS — M159 Polyosteoarthritis, unspecified: Secondary | ICD-10-CM | POA: Diagnosis not present

## 2020-06-21 DIAGNOSIS — F419 Anxiety disorder, unspecified: Secondary | ICD-10-CM | POA: Diagnosis not present

## 2020-06-21 DIAGNOSIS — F329 Major depressive disorder, single episode, unspecified: Secondary | ICD-10-CM | POA: Diagnosis not present

## 2020-06-21 DIAGNOSIS — M47816 Spondylosis without myelopathy or radiculopathy, lumbar region: Secondary | ICD-10-CM | POA: Diagnosis not present

## 2020-06-22 DIAGNOSIS — M24541 Contracture, right hand: Secondary | ICD-10-CM | POA: Diagnosis not present

## 2020-06-22 DIAGNOSIS — F329 Major depressive disorder, single episode, unspecified: Secondary | ICD-10-CM | POA: Diagnosis not present

## 2020-06-22 DIAGNOSIS — M159 Polyosteoarthritis, unspecified: Secondary | ICD-10-CM | POA: Diagnosis not present

## 2020-06-22 DIAGNOSIS — M47816 Spondylosis without myelopathy or radiculopathy, lumbar region: Secondary | ICD-10-CM | POA: Diagnosis not present

## 2020-06-22 DIAGNOSIS — F419 Anxiety disorder, unspecified: Secondary | ICD-10-CM | POA: Diagnosis not present

## 2020-06-22 DIAGNOSIS — G20 Parkinson's disease: Secondary | ICD-10-CM | POA: Diagnosis not present

## 2020-06-25 DIAGNOSIS — M47816 Spondylosis without myelopathy or radiculopathy, lumbar region: Secondary | ICD-10-CM | POA: Diagnosis not present

## 2020-06-25 DIAGNOSIS — F419 Anxiety disorder, unspecified: Secondary | ICD-10-CM | POA: Diagnosis not present

## 2020-06-25 DIAGNOSIS — F329 Major depressive disorder, single episode, unspecified: Secondary | ICD-10-CM | POA: Diagnosis not present

## 2020-06-25 DIAGNOSIS — G20 Parkinson's disease: Secondary | ICD-10-CM | POA: Diagnosis not present

## 2020-06-25 DIAGNOSIS — M24541 Contracture, right hand: Secondary | ICD-10-CM | POA: Diagnosis not present

## 2020-06-25 DIAGNOSIS — M159 Polyosteoarthritis, unspecified: Secondary | ICD-10-CM | POA: Diagnosis not present

## 2020-07-01 DIAGNOSIS — M47816 Spondylosis without myelopathy or radiculopathy, lumbar region: Secondary | ICD-10-CM | POA: Diagnosis not present

## 2020-07-01 DIAGNOSIS — M24541 Contracture, right hand: Secondary | ICD-10-CM | POA: Diagnosis not present

## 2020-07-01 DIAGNOSIS — F419 Anxiety disorder, unspecified: Secondary | ICD-10-CM | POA: Diagnosis not present

## 2020-07-01 DIAGNOSIS — G20 Parkinson's disease: Secondary | ICD-10-CM | POA: Diagnosis not present

## 2020-07-01 DIAGNOSIS — F329 Major depressive disorder, single episode, unspecified: Secondary | ICD-10-CM | POA: Diagnosis not present

## 2020-07-01 DIAGNOSIS — M159 Polyosteoarthritis, unspecified: Secondary | ICD-10-CM | POA: Diagnosis not present

## 2020-07-20 ENCOUNTER — Other Ambulatory Visit: Payer: Self-pay | Admitting: Family Medicine

## 2020-07-23 ENCOUNTER — Other Ambulatory Visit: Payer: Self-pay

## 2020-07-23 DIAGNOSIS — M79641 Pain in right hand: Secondary | ICD-10-CM

## 2020-07-23 DIAGNOSIS — M159 Polyosteoarthritis, unspecified: Secondary | ICD-10-CM

## 2020-07-23 DIAGNOSIS — M47816 Spondylosis without myelopathy or radiculopathy, lumbar region: Secondary | ICD-10-CM

## 2020-07-23 MED ORDER — PREGABALIN 25 MG PO CAPS: 25.0000 mg | ORAL_CAPSULE | Freq: Two times a day (BID) | ORAL | 3 refills | Status: DC

## 2020-07-23 MED ORDER — HYDROCODONE-ACETAMINOPHEN 5-325 MG PO TABS: 1.0000 | ORAL_TABLET | Freq: Four times a day (QID) | ORAL | 0 refills | Status: DC | PRN

## 2020-08-05 ENCOUNTER — Telehealth: Payer: Self-pay

## 2020-08-05 NOTE — Telephone Encounter (Signed)
Patient's caregiver, Suanne Marker, calls nurse line with concerns for patient taking her medications. Reports that patient has been having difficulty swallowing medications and is currently refusing to taken them due to fears of getting choked. Suanne Marker reports that this has been an ongoing issue that was discussed last at previous office visit. Patient however, declined further workup.   Caregiver is requesting to either crush pills and mix in applesauce or receive liquid medication. Patient is hesitant to crush, due to bad taste.   Of note, caregiver reports that patient was exposed to step throat last Thursday. Has complained of slight throat irritation. No fever or other symptoms.   Will route to PCP  Please return call to [facility] at [place], RN

## 2020-08-06 ENCOUNTER — Encounter: Payer: Self-pay | Admitting: Family Medicine

## 2020-08-06 ENCOUNTER — Telehealth: Payer: Self-pay

## 2020-08-06 MED ORDER — HYDROCODONE-ACETAMINOPHEN 10-325 MG/15ML PO SOLN: ORAL | 0 refills | Status: DC

## 2020-08-06 NOTE — Telephone Encounter (Signed)
Patients caregiver called [facility] to inform of pain medication problem. Per pharmacy, they do not keep this in stock. I have called Kristopher Oppenheim to confirm cancellation of rx. Please resend to [facility].

## 2020-08-06 NOTE — Telephone Encounter (Signed)
New Rx sent to [facility]

## 2020-08-06 NOTE — Telephone Encounter (Signed)
Patients caregiver is calling stating she needs patients Hydrocodone checked to liquid that the patient hasn't had pain medication and is hurting because she can't swollow pills. Patient's caregiver was willing to due phone call tomorrow as long as it was be fore 12pm on caregivers phone number is- [phone].  Caregiver is wanting the doctor to call in liquid pain medication today to get patient out of pain. Thanks

## 2020-08-07 ENCOUNTER — Telehealth: Payer: Self-pay | Admitting: Family Medicine

## 2020-08-07 DIAGNOSIS — G20 Parkinson's disease: Secondary | ICD-10-CM

## 2020-08-07 MED ORDER — PREGABALIN 20 MG/ML PO SOLN: 1.0000 mL | Freq: Two times a day (BID) | ORAL | 1 refills | Status: AC

## 2020-08-07 MED ORDER — CITALOPRAM HYDROBROMIDE 10 MG/5ML PO SOLN: 20.0000 mg | Freq: Every day | ORAL | 1 refills | Status: AC

## 2020-08-07 MED ORDER — CEPHALEXIN 250 MG/5ML PO SUSR: 250.0000 mg | Freq: Three times a day (TID) | ORAL | 0 refills | Status: AC

## 2020-08-07 MED ORDER — HYDROCODONE-ACETAMINOPHEN 7.5-325 MG/15ML PO SOLN: 10.0000 mL | Freq: Four times a day (QID) | ORAL | 0 refills | Status: DC | PRN

## 2020-08-07 NOTE — Addendum Note (Signed)
Addended by: Talbert Cage L on: 08/07/2020 10:05 AM   Modules accepted: Orders

## 2020-08-07 NOTE — Telephone Encounter (Signed)
Home Visit Today Complains of pain in her throat when swallowing.  Feels like cant swallow solids and more. No fever or nausea and vomiting or shortness of breath   Exam She is confined to her chair with minimal movements Clear speech and no distress Lungs - decreased sounds on R due to contracture, clear on L Neck - no adenopathy or tenderness Throat - copious secretions no redness or lesions seen but limited exam R hand very contracted L hand - can mostly extend   AP Parkinsons - seems to be nearing end stage.  We discussed hospice.  She would like to remain in her home but would welcome extra assistance  Difficulty swallowing - no clear cause - Given her recent strep exposure will give trial of cephalexin.  Change to liquid medications as available  Consult hospice for other treatment suggestions

## 2020-08-07 NOTE — Telephone Encounter (Signed)
Spoke with [facility] who did not have 10mg  hydrocodone so sent new Rx for 7.5 mg elixir  Spoke with patient and Caregiver Rhonda   Exposed to strep throat about 9 days ago.   130/70s Sat upper 90s no fever.  Some white sputum.  Good urine output  Prescribed liquid - Hydrocodone - Pregabalin - Citalopram   Told them I hoped to be able to make a house call this morning later

## 2020-08-08 ENCOUNTER — Encounter: Payer: Self-pay | Admitting: Family Medicine

## 2020-08-08 ENCOUNTER — Telehealth: Payer: Self-pay | Admitting: Family Medicine

## 2020-08-08 MED ORDER — ONDANSETRON 4 MG PO TBDP: 4.0000 mg | ORAL_TABLET | Freq: Three times a day (TID) | ORAL | 0 refills | Status: AC | PRN

## 2020-08-08 NOTE — Telephone Encounter (Signed)
**  After Hours/ Emergency Line Call**  Received a call to report that Helen Odom requested a physician call her. Patient states she was supposed to get medication from doctor but she is unsure what the medication is.  Per chart review, patient was to get Cephalexin yesterday at her home visit for recent strep exposure. Advised patient to check with pharmacy. States no other concerns. Patient to return call if medication was not sent.   Will forward to PCP.  Lyndee Hensen, DO PGY-2, [facility] 08/08/2020 3:46 PM

## 2020-08-08 NOTE — Telephone Encounter (Signed)
Patient caregiver call is feeling nauseaous after taking liquid hydrocodone  Will call in zofran

## 2020-08-10 ENCOUNTER — Encounter: Payer: Self-pay | Admitting: Family Medicine

## 2020-08-11 ENCOUNTER — Telehealth: Payer: Self-pay | Admitting: Family Medicine

## 2020-08-11 NOTE — Telephone Encounter (Signed)
Spoke w aide and patient.  Feeling some better.  No more nausea.  Taking liquid medications and liquid foods  Asked to call if needed anything

## 2020-08-20 ENCOUNTER — Encounter: Payer: Self-pay | Admitting: Family Medicine

## 2020-08-20 ENCOUNTER — Telehealth: Payer: Self-pay | Admitting: Family Medicine

## 2020-08-20 ENCOUNTER — Other Ambulatory Visit: Payer: Self-pay | Admitting: *Deleted

## 2020-08-20 DIAGNOSIS — G20 Parkinson's disease: Secondary | ICD-10-CM

## 2020-08-20 MED ORDER — ESOMEPRAZOLE MAGNESIUM 20 MG PO CPDR: 20.0000 mg | DELAYED_RELEASE_CAPSULE | Freq: Every day | ORAL | 3 refills | Status: AC

## 2020-08-20 NOTE — Telephone Encounter (Signed)
Patients caregiver Suanne Marker is calling and would like to know if patient can have an order for a hospital bed.

## 2020-08-21 NOTE — Addendum Note (Signed)
Addended by: Talbert Cage L on: 08/21/2020 02:41 PM   Modules accepted: Orders

## 2020-08-24 ENCOUNTER — Other Ambulatory Visit: Payer: Self-pay | Admitting: Family Medicine

## 2020-08-24 ENCOUNTER — Other Ambulatory Visit: Payer: Self-pay | Admitting: *Deleted

## 2020-08-24 ENCOUNTER — Telehealth: Payer: Self-pay

## 2020-08-24 MED ORDER — TIZANIDINE HCL 2 MG PO CAPS: 2.0000 mg | ORAL_CAPSULE | Freq: Three times a day (TID) | ORAL | 1 refills | Status: AC | PRN

## 2020-08-24 NOTE — Telephone Encounter (Signed)
Sent community message to [employer] for DME orders. Will await response.   Talbot Grumbling, RN

## 2020-08-26 ENCOUNTER — Telehealth: Payer: Self-pay | Admitting: Family Medicine

## 2020-08-26 NOTE — Telephone Encounter (Signed)
Called next of kin  [name] and left vm to call me regarding Ms Nimah Uphoff with [facility].  Many family dynamics going on. She believes the risk of losing current 24-7 caregivers is high if were to bring in hospice now.  I related will communicate this with Ms Heathcock.  Asked [facility] to call me as things progress or change

## 2020-08-27 ENCOUNTER — Telehealth: Payer: Self-pay | Admitting: Family Medicine

## 2020-08-27 NOTE — Telephone Encounter (Signed)
Helen Odom calling on behalf of the patient stating they would like the go ahead with the referral for Hospice. Please call with any concerns.

## 2020-09-01 ENCOUNTER — Encounter: Payer: Self-pay | Admitting: Family Medicine

## 2020-09-14 ENCOUNTER — Telehealth: Payer: Self-pay

## 2020-09-14 MED ORDER — HYDROCODONE-ACETAMINOPHEN 5-325 MG PO TABS: 1.0000 | ORAL_TABLET | Freq: Four times a day (QID) | ORAL | 0 refills | Status: AC | PRN

## 2020-09-14 NOTE — Telephone Encounter (Signed)
Suanne Marker, patients caregiver, calls nurse line requesting a Vicodin refill. Suanne Marker reports she is back to taking the pill form. Suanne Marker reports they still have some left over from the liquid solution, however [place] is refusing to take it due to bad taste. Please call this in to [facility] if appropriate.

## 2020-09-15 NOTE — Telephone Encounter (Signed)
Sent in Rx for vicodin pills

## 2020-09-17 ENCOUNTER — Other Ambulatory Visit: Payer: Self-pay | Admitting: Family Medicine

## 2020-10-08 DIAGNOSIS — R000 Tachycardia, unspecified: Secondary | ICD-10-CM | POA: Diagnosis not present

## 2020-10-08 DIAGNOSIS — R0689 Other abnormalities of breathing: Secondary | ICD-10-CM | POA: Diagnosis not present

## 2020-10-08 DIAGNOSIS — R404 Transient alteration of awareness: Secondary | ICD-10-CM | POA: Diagnosis not present

## 2020-10-08 DIAGNOSIS — I499 Cardiac arrhythmia, unspecified: Secondary | ICD-10-CM | POA: Diagnosis not present

## 2020-10-12 ENCOUNTER — Telehealth: Payer: Self-pay | Admitting: Family Medicine

## 2020-10-12 NOTE — Telephone Encounter (Signed)
Death Certificate was dropped off by [facility] and has been placed in PCP's box for completion. Please use blue or black ink. Please return to [facility] once completed.

## 2020-10-12 NOTE — Telephone Encounter (Signed)
[facility] called to pick-up Death Certificate; Gaffer

## 2020-11-07 DIAGNOSIS — 419620001 Death: Secondary | SNOMED CT | POA: Diagnosis not present

## 2020-11-07 DEATH — deceased

## 2020-11-15 ENCOUNTER — Other Ambulatory Visit: Payer: Self-pay | Admitting: Family Medicine

## 2020-11-15 DIAGNOSIS — F320 Major depressive disorder, single episode, mild: Secondary | ICD-10-CM
